# Patient Record
Sex: Female | Born: 1955 | Race: White | Hispanic: No | Smoking: Current every day smoker
Health system: Southern US, Community
[De-identification: ages and names within clinical notes are randomized; demographics above are authoritative.]

## PROBLEM LIST (undated history)

## (undated) DIAGNOSIS — I219 Acute myocardial infarction, unspecified: Secondary | ICD-10-CM

## (undated) DIAGNOSIS — E7800 Pure hypercholesterolemia, unspecified: Secondary | ICD-10-CM

## (undated) DIAGNOSIS — J439 Emphysema, unspecified: Secondary | ICD-10-CM

## (undated) DIAGNOSIS — Z720 Tobacco use: Secondary | ICD-10-CM

## (undated) DIAGNOSIS — I2510 Atherosclerotic heart disease of native coronary artery without angina pectoris: Secondary | ICD-10-CM

## (undated) DIAGNOSIS — F32A Depression, unspecified: Secondary | ICD-10-CM

## (undated) DIAGNOSIS — I5032 Chronic diastolic (congestive) heart failure: Secondary | ICD-10-CM

## (undated) DIAGNOSIS — Z1371 Encounter for nonprocreative screening for genetic disease carrier status: Secondary | ICD-10-CM

## (undated) DIAGNOSIS — M810 Age-related osteoporosis without current pathological fracture: Secondary | ICD-10-CM

## (undated) DIAGNOSIS — G2581 Restless legs syndrome: Secondary | ICD-10-CM

## (undated) DIAGNOSIS — Z9289 Personal history of other medical treatment: Secondary | ICD-10-CM

## (undated) DIAGNOSIS — F419 Anxiety disorder, unspecified: Secondary | ICD-10-CM

## (undated) DIAGNOSIS — M8580 Other specified disorders of bone density and structure, unspecified site: Secondary | ICD-10-CM

## (undated) DIAGNOSIS — G4730 Sleep apnea, unspecified: Secondary | ICD-10-CM

## (undated) DIAGNOSIS — E119 Type 2 diabetes mellitus without complications: Secondary | ICD-10-CM

## (undated) DIAGNOSIS — F329 Major depressive disorder, single episode, unspecified: Secondary | ICD-10-CM

## (undated) DIAGNOSIS — Z8041 Family history of malignant neoplasm of ovary: Secondary | ICD-10-CM

## (undated) DIAGNOSIS — I639 Cerebral infarction, unspecified: Secondary | ICD-10-CM

## (undated) DIAGNOSIS — I10 Essential (primary) hypertension: Secondary | ICD-10-CM

## (undated) DIAGNOSIS — T7840XA Allergy, unspecified, initial encounter: Secondary | ICD-10-CM

## (undated) DIAGNOSIS — R7302 Impaired glucose tolerance (oral): Secondary | ICD-10-CM

## (undated) DIAGNOSIS — J449 Chronic obstructive pulmonary disease, unspecified: Secondary | ICD-10-CM

## (undated) HISTORY — DX: Allergy, unspecified, initial encounter: T78.40XA

## (undated) HISTORY — DX: Depression, unspecified: F32.A

## (undated) HISTORY — DX: Anxiety disorder, unspecified: F41.9

## (undated) HISTORY — PX: ABDOMINAL HYSTERECTOMY: SHX81

## (undated) HISTORY — DX: Personal history of other medical treatment: Z92.89

## (undated) HISTORY — DX: Sleep apnea, unspecified: G47.30

## (undated) HISTORY — DX: Encounter for nonprocreative screening for genetic disease carrier status: Z13.71

## (undated) HISTORY — DX: Impaired glucose tolerance (oral): R73.02

## (undated) HISTORY — DX: Cerebral infarction, unspecified: I63.9

## (undated) HISTORY — DX: Major depressive disorder, single episode, unspecified: F32.9

## (undated) HISTORY — DX: Emphysema, unspecified: J43.9

## (undated) HISTORY — DX: Chronic diastolic (congestive) heart failure: I50.32

## (undated) HISTORY — DX: Other specified disorders of bone density and structure, unspecified site: M85.80

## (undated) HISTORY — DX: Family history of malignant neoplasm of ovary: Z80.41

## (undated) HISTORY — DX: Age-related osteoporosis without current pathological fracture: M81.0

## (undated) HISTORY — DX: Essential (primary) hypertension: I10

## (undated) HISTORY — DX: Type 2 diabetes mellitus without complications: E11.9

## (undated) HISTORY — DX: Chronic obstructive pulmonary disease, unspecified: J44.9

---

## 1998-02-12 HISTORY — PX: OTHER SURGICAL HISTORY: SHX169

## 1998-10-28 ENCOUNTER — Other Ambulatory Visit: Admission: RE | Admit: 1998-10-28 | Discharge: 1998-10-28 | Payer: Self-pay | Admitting: *Deleted

## 2000-02-25 ENCOUNTER — Other Ambulatory Visit: Admission: RE | Admit: 2000-02-25 | Discharge: 2000-02-25 | Payer: Self-pay | Admitting: *Deleted

## 2003-09-15 LAB — HM MAMMOGRAPHY

## 2004-06-14 ENCOUNTER — Encounter: Payer: Self-pay | Admitting: Family Medicine

## 2004-06-14 LAB — CONVERTED CEMR LAB

## 2004-07-18 ENCOUNTER — Ambulatory Visit: Payer: Self-pay | Admitting: Family Medicine

## 2004-08-15 ENCOUNTER — Ambulatory Visit: Payer: Self-pay | Admitting: Family Medicine

## 2004-08-20 ENCOUNTER — Ambulatory Visit: Payer: Self-pay | Admitting: Family Medicine

## 2004-10-01 ENCOUNTER — Ambulatory Visit: Payer: Self-pay | Admitting: Family Medicine

## 2004-10-24 ENCOUNTER — Ambulatory Visit: Payer: Self-pay | Admitting: Family Medicine

## 2005-03-02 ENCOUNTER — Ambulatory Visit: Payer: Self-pay | Admitting: Family Medicine

## 2005-05-04 ENCOUNTER — Ambulatory Visit: Payer: Self-pay | Admitting: Family Medicine

## 2005-06-19 ENCOUNTER — Ambulatory Visit: Payer: Self-pay | Admitting: Family Medicine

## 2005-06-23 ENCOUNTER — Ambulatory Visit: Payer: Self-pay | Admitting: Family Medicine

## 2005-10-27 LAB — HM DEXA SCAN

## 2005-11-30 ENCOUNTER — Ambulatory Visit: Payer: Self-pay | Admitting: Family Medicine

## 2005-12-31 ENCOUNTER — Ambulatory Visit: Payer: Self-pay | Admitting: Family Medicine

## 2006-02-11 ENCOUNTER — Ambulatory Visit: Payer: Self-pay | Admitting: Family Medicine

## 2006-06-11 ENCOUNTER — Ambulatory Visit: Payer: Self-pay | Admitting: Family Medicine

## 2006-10-15 HISTORY — PX: COLONOSCOPY: SHX174

## 2006-11-01 ENCOUNTER — Ambulatory Visit: Payer: Self-pay | Admitting: Unknown Physician Specialty

## 2006-11-01 LAB — HM COLONOSCOPY

## 2006-11-09 ENCOUNTER — Ambulatory Visit: Payer: Self-pay | Admitting: Family Medicine

## 2007-02-03 ENCOUNTER — Encounter: Payer: Self-pay | Admitting: Family Medicine

## 2007-02-07 DIAGNOSIS — I10 Essential (primary) hypertension: Secondary | ICD-10-CM | POA: Insufficient documentation

## 2007-02-07 DIAGNOSIS — M810 Age-related osteoporosis without current pathological fracture: Secondary | ICD-10-CM | POA: Insufficient documentation

## 2007-02-07 DIAGNOSIS — E7800 Pure hypercholesterolemia, unspecified: Secondary | ICD-10-CM | POA: Insufficient documentation

## 2007-02-07 DIAGNOSIS — E785 Hyperlipidemia, unspecified: Secondary | ICD-10-CM | POA: Insufficient documentation

## 2007-02-07 DIAGNOSIS — E1169 Type 2 diabetes mellitus with other specified complication: Secondary | ICD-10-CM | POA: Insufficient documentation

## 2007-02-07 DIAGNOSIS — F17200 Nicotine dependence, unspecified, uncomplicated: Secondary | ICD-10-CM | POA: Insufficient documentation

## 2007-02-16 ENCOUNTER — Ambulatory Visit: Payer: Self-pay | Admitting: Family Medicine

## 2007-03-11 ENCOUNTER — Ambulatory Visit: Payer: Self-pay | Admitting: Family Medicine

## 2007-03-11 DIAGNOSIS — G2581 Restless legs syndrome: Secondary | ICD-10-CM | POA: Insufficient documentation

## 2007-03-13 LAB — CONVERTED CEMR LAB
ALT: 17 units/L (ref 0–35)
AST: 16 units/L (ref 0–37)
Albumin: 3.8 g/dL (ref 3.5–5.2)
Alkaline Phosphatase: 113 units/L (ref 39–117)
BUN: 5 mg/dL — ABNORMAL LOW (ref 6–23)
Basophils Absolute: 0 10*3/uL (ref 0.0–0.1)
Basophils Relative: 0.4 % (ref 0.0–1.0)
Bilirubin, Direct: 0.1 mg/dL (ref 0.0–0.3)
CO2: 30 meq/L (ref 19–32)
Calcium: 9.3 mg/dL (ref 8.4–10.5)
Chloride: 111 meq/L (ref 96–112)
Cholesterol: 182 mg/dL (ref 0–200)
Creatinine, Ser: 0.7 mg/dL (ref 0.4–1.2)
Eosinophils Absolute: 0.3 10*3/uL (ref 0.0–0.6)
Eosinophils Relative: 4.7 % (ref 0.0–5.0)
Ferritin: 149.3 ng/mL (ref 10.0–291.0)
GFR calc Af Amer: 114 mL/min
GFR calc non Af Amer: 94 mL/min
Glucose, Bld: 107 mg/dL — ABNORMAL HIGH (ref 70–99)
HCT: 43.7 % (ref 36.0–46.0)
HDL: 30.3 mg/dL — ABNORMAL LOW (ref >39.0)
Hemoglobin: 15 g/dL (ref 12.0–15.0)
Iron: 94 ug/dL (ref 42–145)
LDL Cholesterol: 127 mg/dL — ABNORMAL HIGH (ref 0–99)
Lymphocytes Relative: 33.4 % (ref 12.0–46.0)
MCHC: 34.3 g/dL (ref 30.0–36.0)
MCV: 93 fL (ref 78.0–100.0)
Monocytes Absolute: 0.5 10*3/uL (ref 0.2–0.7)
Monocytes Relative: 8 % (ref 3.0–11.0)
Neutro Abs: 3.7 10*3/uL (ref 1.4–7.7)
Neutrophils Relative %: 53.5 % (ref 43.0–77.0)
Platelets: 261 10*3/uL (ref 150–400)
Potassium: 4.3 meq/L (ref 3.5–5.1)
RBC: 4.7 M/uL (ref 3.87–5.11)
RDW: 12.4 % (ref 11.5–14.6)
Sodium: 145 meq/L (ref 135–145)
TSH: 0.33 microintl units/mL — ABNORMAL LOW (ref 0.35–5.50)
Total Bilirubin: 0.8 mg/dL (ref 0.3–1.2)
Total CHOL/HDL Ratio: 6
Total Protein: 6.7 g/dL (ref 6.0–8.3)
Triglycerides: 123 mg/dL (ref 0–149)
VLDL: 25 mg/dL (ref 0–40)
Vitamin B-12: 189 pg/mL — ABNORMAL LOW (ref 211–911)
WBC: 6.8 10*3/uL (ref 4.5–10.5)

## 2007-05-19 ENCOUNTER — Telehealth: Payer: Self-pay | Admitting: Family Medicine

## 2007-05-24 ENCOUNTER — Ambulatory Visit: Payer: Self-pay | Admitting: Family Medicine

## 2007-07-05 ENCOUNTER — Telehealth: Payer: Self-pay | Admitting: Family Medicine

## 2007-07-05 ENCOUNTER — Ambulatory Visit: Payer: Self-pay | Admitting: Family Medicine

## 2007-07-28 ENCOUNTER — Ambulatory Visit: Payer: Self-pay | Admitting: Family Medicine

## 2007-07-28 DIAGNOSIS — F411 Generalized anxiety disorder: Secondary | ICD-10-CM | POA: Insufficient documentation

## 2007-07-28 DIAGNOSIS — F3289 Other specified depressive episodes: Secondary | ICD-10-CM | POA: Insufficient documentation

## 2007-07-28 DIAGNOSIS — M25569 Pain in unspecified knee: Secondary | ICD-10-CM | POA: Insufficient documentation

## 2007-07-28 DIAGNOSIS — F329 Major depressive disorder, single episode, unspecified: Secondary | ICD-10-CM | POA: Insufficient documentation

## 2007-08-17 ENCOUNTER — Encounter (INDEPENDENT_AMBULATORY_CARE_PROVIDER_SITE_OTHER): Payer: Self-pay | Admitting: *Deleted

## 2007-10-31 ENCOUNTER — Telehealth: Payer: Self-pay | Admitting: Family Medicine

## 2007-12-19 ENCOUNTER — Ambulatory Visit: Payer: Self-pay | Admitting: Family Medicine

## 2007-12-19 LAB — CONVERTED CEMR LAB
ALT: 17 units/L (ref 0–35)
AST: 17 units/L (ref 0–37)

## 2007-12-20 ENCOUNTER — Telehealth: Payer: Self-pay | Admitting: Family Medicine

## 2008-01-09 ENCOUNTER — Telehealth: Payer: Self-pay | Admitting: Family Medicine

## 2008-01-23 ENCOUNTER — Ambulatory Visit: Payer: Self-pay | Admitting: Family Medicine

## 2008-01-23 LAB — CONVERTED CEMR LAB
ALT: 17 units/L (ref 0–35)
AST: 18 units/L (ref 0–37)
Cholesterol: 195 mg/dL (ref 0–200)
HDL: 31 mg/dL — ABNORMAL LOW (ref >39.0)
LDL Cholesterol: 137 mg/dL — ABNORMAL HIGH (ref 0–99)
Total CHOL/HDL Ratio: 6.3
Triglycerides: 136 mg/dL (ref 0–149)
VLDL: 27 mg/dL (ref 0–40)

## 2008-02-08 ENCOUNTER — Encounter (INDEPENDENT_AMBULATORY_CARE_PROVIDER_SITE_OTHER): Payer: Self-pay | Admitting: *Deleted

## 2008-04-02 ENCOUNTER — Ambulatory Visit: Payer: Self-pay | Admitting: Family Medicine

## 2008-04-18 ENCOUNTER — Telehealth: Payer: Self-pay | Admitting: Family Medicine

## 2008-09-03 ENCOUNTER — Telehealth: Payer: Self-pay | Admitting: Family Medicine

## 2008-12-17 ENCOUNTER — Telehealth: Payer: Self-pay | Admitting: Family Medicine

## 2009-01-09 ENCOUNTER — Ambulatory Visit: Payer: Self-pay | Admitting: Family Medicine

## 2009-01-09 DIAGNOSIS — R5381 Other malaise: Secondary | ICD-10-CM | POA: Insufficient documentation

## 2009-01-09 DIAGNOSIS — R5383 Other fatigue: Secondary | ICD-10-CM | POA: Insufficient documentation

## 2009-01-09 DIAGNOSIS — IMO0002 Reserved for concepts with insufficient information to code with codable children: Secondary | ICD-10-CM | POA: Insufficient documentation

## 2009-08-14 ENCOUNTER — Telehealth: Payer: Self-pay | Admitting: Family Medicine

## 2009-08-14 ENCOUNTER — Ambulatory Visit: Payer: Self-pay | Admitting: Family Medicine

## 2009-10-07 ENCOUNTER — Telehealth: Payer: Self-pay | Admitting: Family Medicine

## 2009-11-18 ENCOUNTER — Telehealth: Payer: Self-pay | Admitting: Family Medicine

## 2010-04-17 ENCOUNTER — Encounter (INDEPENDENT_AMBULATORY_CARE_PROVIDER_SITE_OTHER): Payer: Self-pay | Admitting: *Deleted

## 2010-10-14 NOTE — Progress Notes (Signed)
Summary: Refill on Xanax  Phone Note Call from Patient Call back at [phone]   Caller: Patient Summary of Call: Pt has appt on 05.14.09. Wants to know if you will give her an refill on her generic Xanax. Initial call taken by: Mickle Asper,  January 09, 2008 2:06 PM  Follow-up for Phone Call        PRESCRIBTION CALLED INTO PHARMACYPATIENT NOTIFIED ABOUT MEDICATIONS Follow-up by: Providence Crosby,  January 09, 2008 5:17 PM      Prescriptions: ALPRAZOLAM 0.5 MG  TABS (ALPRAZOLAM) one tab by mouth HS 1/2 during day  #45 x 2   Entered and Authorized by:   Shaune Leeks MD   Signed by:   Providence Crosby on 01/09/2008   Method used:   Printed then faxed [facility] ...       [facility]  [address]  [id number]       [address]       [place], [place]  [address]       Ph: [phone] or [phone]       Fax: [phone]   RxID:   [id number]

## 2010-10-14 NOTE — Progress Notes (Signed)
Summary: asking about lisinopril  Phone Note Call from Patient Call back at Work Phone [phone] Call back at [phone] cell   Caller: Patient Call For: dr schaller Summary of Call: pt had been taking lisinopril but was taken off of it, she checked her BP last night and it was 166/88, which scared her, so she took a lisinopril. she is asking if she should go back on it, she has been off of it for about 2-3 weeks- she was taking 10 mg daily Initial call taken by: Lowella Petties,  April 18, 2008 1:00 PM  Follow-up for Phone Call        Yes restart. Please add back to med list. Follow-up by: Kerby Nora MD,  April 18, 2008 2:05 PM  Additional Follow-up for Phone Call Additional follow up Details #1::        Patient Advised.  Additional Follow-up by: Delilah Shan,  April 18, 2008 2:22 PM

## 2010-10-14 NOTE — Letter (Signed)
Summary: [place] No Show Letter  [place] [facility]  [address] [place], [place] [address]   Phone: [phone]  Fax: [phone]    02/08/2008 MRN: [id number]  [facility] [address] [place], [place]  [address]   Dear Ms. Torrisi,   Our records indicate that you missed your scheduled appointment with __DR. SCHALLER___________________ on __05/27/2009 AT 4 PM__________.  Please contact this office to reschedule your appointment as soon as possible.  It is important that you keep your scheduled appointments with your physician, so we can provide you the best care possible.  Please be advised that there may be a charge for "no show" appointments.    Sincerely,   [place] [facility]

## 2010-10-14 NOTE — Progress Notes (Signed)
Summary: alprazolam  Phone Note Refill Request Message from:  Patient on October 07, 2009 12:42 PM  Refills Requested: Medication #1:  ALPRAZOLAM 0.5 MG  TABS one tab by mouth HS 1/2 during day   Supply Requested: [facility] [place] [phone]   Please advise patient when this is called in [phone]   Method Requested: Telephone to Pharmacy Initial call taken by: Benny Lennert CMA Duncan Dull),  October 07, 2009 12:43 PM  Follow-up for Phone Call        Rx called to pharmacy Follow-up by: Benny Lennert CMA Duncan Dull),  October 07, 2009 2:57 PM    Prescriptions: ALPRAZOLAM 0.5 MG  TABS (ALPRAZOLAM) one tab by mouth HS 1/2 during day  #45 x 6   Entered and Authorized by:   Shaune Leeks MD   Signed by:   Shaune Leeks MD on 10/07/2009   Method used:   Telephoned to ...       [facility]  [place] (retail)       [address]       [place], [place]  [address]       Ph: [phone] or [phone]       Fax: [phone]   RxID:   [id number]

## 2010-10-14 NOTE — Assessment & Plan Note (Signed)
Summary: FOLLOW UP   Vital Signs:  Patient Profile:   55 Years Old Female Weight:      188 pounds Temp:     98.3 degrees F oral Pulse rate:   72 / minute Pulse rhythm:   regular BP sitting:   110 / 80  (left arm) Cuff size:   regular  Vitals Entered By: Providence Crosby (July 28, 2007 3:51 PM)                 Chief Complaint:  2 week f/u.  History of Present Illness: Here for f/u.  Depression significantly better, anxiety improved and has been taking a half of Xanax during the day which has really helped. Has had some knee pain bilat, L>R. indistinct in the area of the kneecap but not typically up or down steps.  Current Allergies: No known allergies       Physical Exam  General:     Well-developed,well-nourished,in no acute distress; alert,appropriate and cooperative throughout examination Head:     Normocephalic and atraumatic without obvious abnormalities. No apparent alopecia or balding. Eyes:     Conjunctiva clear bilaterally.  Ears:     External ear exam shows no significant lesions or deformities.  Otoscopic examination reveals clear canals, tympanic membranes are intact bilaterally without bulging, retraction, inflammation or discharge. Hearing is grossly normal bilaterally. Nose:     External nasal examination shows no deformity or inflammation. Nasal mucosa are pink and moist without lesions or exudates. Mouth:     Oral mucosa and oropharynx without lesions or exudates.  Teeth in good repair. Neck:     No deformities, masses, or tenderness noted. Chest Wall:     No deformities, masses, or tenderness noted. Lungs:     Normal respiratory effort, chest expands symmetrically. Lungs are clear to auscultation, no crackles or wheezes. Heart:     Normal rate and regular rhythm. S1 and S2 normal without gallop, murmur, click, rub or other extra sounds. Abdomen:     Bowel sounds positive,abdomen soft and non-tender without masses, organomegaly or hernias  noted. Extremities:     Knee exam jopint line and provacative maneuvers basically nml.    Impression & Recommendations:  Problem # 1:  KNEE PAIN, BILATERAL (ICD-719.46) Assessment: New  Orders: Orthopedic Referral (Ortho) Discussed strengthening exercises, use of ice or heat, and medications.   Problem # 2:  RESTLESS LEG SYNDROME (ICD-333.94) Assessment: Improved Cont Lyrica, which has helped significantly.  Problem # 3:  DEPRESSION (ICD-311) Assessment: Improved  Her updated medication list for this problem includes:    Zoloft 100 Mg Tabs (Sertraline hcl) ..... One tab    Alprazolam 0.5 Mg Tabs (Alprazolam) ..... One tab by mouth hs 1/2 during day   Problem # 4:  ANXIETY DISORDER (ICD-300.00) Assessment: Improved Change xanax to 11/2 qd Her updated medication list for this problem includes:    Zoloft 100 Mg Tabs (Sertraline hcl) ..... One tab    Alprazolam 0.5 Mg Tabs (Alprazolam) ..... One tab by mouth hs 1/2 during day Discussed medication use and relaxation techniques.   Complete Medication List: 1)  Lipitor 40 Mg Tabs (Atorvastatin calcium) .... Take one by mouth daily 2)  Requip 1 Mg Tabs (Ropinirole hydrochloride) .... Take one by mouth qhs 3)  Lisinopril 10 Mg Tabs (Lisinopril) .... Take one by mouth daily 4)  Actonel 35 Mg Tabs (Risedronate sodium) .... Take one by mouth daily 5)  Tylenol Pm Extra Strength 500-25 Mg Tabs (Diphenhydramine-apap (sleep)) .Marland KitchenMarland KitchenMarland Kitchen  Take by mouth as directed prn 6)  Zoloft 100 Mg Tabs (Sertraline hcl) .... One tab 7)  Alprazolam 0.5 Mg Tabs (Alprazolam) .... One tab by mouth hs 1/2 during day   Patient Instructions: 1)  Refer to Ortho for knee pain 2)  RTC as directed, cancel appt next month.    Prescriptions: ALPRAZOLAM 0.5 MG  TABS (ALPRAZOLAM) one tab by mouth HS 1/2 during day  #45 x 2   Entered and Authorized by:   Shaune Leeks MD   Signed by:   Shaune Leeks MD on 07/28/2007   Method used:   Print then Give  to Patient   RxID:   [id number] ACTONEL 35 MG TABS (RISEDRONATE SODIUM) take one by mouth daily  #30 x 12   Entered and Authorized by:   Shaune Leeks MD   Signed by:   Shaune Leeks MD on 07/28/2007   Method used:   Print then Give to Patient   RxID:   [id number]  ]

## 2010-10-14 NOTE — Assessment & Plan Note (Signed)
Summary: FOLLOW UP / LFW   Vital Signs:  Patient Profile:   55 Years Old Female Weight:      190 pounds Temp:     98.1 degrees F oral Pulse rate:   80 / minute Pulse rhythm:   regular BP sitting:   130 / 70  (left arm) Cuff size:   regular  Vitals Entered By: Providence Crosby (April 02, 2008 4:02 PM)                 Chief Complaint:  followup visit.  History of Present Illness: Here for followup of various things. Started on Simvastatin 80mg  in place of Lipitor 40 several months ago due to cost and failed to show up fior followup. Her labs show Simvastatin not to help as much as Lipitor but probably acceptable. She is off all meds for about two months as she just didn't have them refilled. I told her just stopping Zoloft and poss Lyrica (which at this point she said she did not stop the Lyrica because it helped so much to sleep) and maybe Xanax if she takes it regularly will make her feel really bad and she said that explains a lot of things. She is currently off Lisinopril as well with normal BP at this point. She is also off her Actonel. She also complained of weight gain altho she admits she comes home from work and wants to do nothing and often doesn't. She needs a motivating exercise program with which she can get involved.    Prior Medications Reviewed Using: Patient Recall  Current Allergies: No known allergies       Physical Exam  General:     Well-developed,well-nourished,in no acute distress; alert,appropriate and cooperative throughout examination Head:     Normocephalic and atraumatic without obvious abnormalities. No apparent alopecia or balding. Eyes:     Conjunctiva clear bilaterally.  Ears:     External ear exam shows no significant lesions or deformities.  Otoscopic examination reveals clear canals, tympanic membranes are intact bilaterally without bulging, retraction, inflammation or discharge. Hearing is grossly normal bilaterally. Nose:     External  nasal examination shows no deformity or inflammation. Nasal mucosa are pink and moist without lesions or exudates. Mouth:     Oral mucosa and oropharynx without lesions or exudates.  Teeth in good repair. Neck:     No deformities, masses, or tenderness noted. Chest Wall:     No deformities, masses, or tenderness noted. Lungs:     Normal respiratory effort, chest expands symmetrically. Lungs are clear to auscultation, no crackles or wheezes. Heart:     Normal rate and regular rhythm. S1 and S2 normal without gallop, murmur, click, rub or other extra sounds. Psych:     Reasonable affect, good eye contact and good attitude.    Impression & Recommendations:  Problem # 1:  ANXIETY DISORDER (ICD-300.00) Assessment: Unchanged Meds prescribed today, will try  Effexor to preclude overt weight loss. Xanax requested by pt. Her updated medication list for this problem includes:    Effexor 37.5 Mg Tabs (Venlafaxine hcl) ..... One tab by mouth twice a day    Alprazolam 0.5 Mg Tabs (Alprazolam) ..... One tab by mouth hs 1/2 during day   Problem # 2:  RESTLESS LEG SYNDROME (ICD-333.94) Assessment: Improved Requip helps a lot.  Problem # 3:  Hx of HYPERCHOLESTEROLEMIA (ICD-272.0) Assessment: Unchanged Adequate or close thereto on Simvastatin 80. Not as good as Lipitor 40. Her updated medication list for  this problem includes:    Simvastatin 80 Mg Tabs (Simvastatin) ..... One tab by mouth hs.  Labs Reviewed: Chol: 195 (01/23/2008)   HDL: 31.0 (01/23/2008)   LDL: 137 (01/23/2008)   TG: 136 (01/23/2008) LDL 127 on Lipitor 40mg . SGOT: 18 (01/23/2008)   SGPT: 17 (01/23/2008)   Problem # 4:  HYPERTENSION (ICD-401.9) Assessment: Improved Doesn't appear to need Lisinopril so will stop. Her updated medication list for this problem includes:    Lisinopril 10 Mg Tabs (Lisinopril) .Marland Kitchen... Take one by mouth daily  BP today: 130/70  BP 130/70 by me. Prior BP: 110/80 (07/28/2007)  Labs Reviewed:  Creat: 0.7 (03/11/2007) Chol: 195 (01/23/2008)   HDL: 31.0 (01/23/2008)   LDL: 137 (01/23/2008)   TG: 136 (01/23/2008)   Complete Medication List: 1)  Requip 1 Mg Tabs (Ropinirole hydrochloride) .... Take one by mouth qhs 2)  Lisinopril 10 Mg Tabs (Lisinopril) .... Take one by mouth daily 3)  Actonel 35 Mg Tabs (Risedronate sodium) .... Take one by mouth daily 4)  Tylenol Pm Extra Strength 500-25 Mg Tabs (Diphenhydramine-apap (sleep)) .... Take by mouth as directed prn 5)  Effexor 37.5 Mg Tabs (Venlafaxine hcl) .... One tab by mouth twice a day 6)  Alprazolam 0.5 Mg Tabs (Alprazolam) .... One tab by mouth hs 1/2 during day 7)  Simvastatin 80 Mg Tabs (Simvastatin) .... One tab by mouth hs.   Patient Instructions: 1)  RTC 2 mos.   Prescriptions: ALPRAZOLAM 0.5 MG  TABS (ALPRAZOLAM) one tab by mouth HS 1/2 during day  #45 x 12   Entered and Authorized by:   Shaune Leeks MD   Signed by:   Shaune Leeks MD on 04/02/2008   Method used:   Print then Give to Patient   RxID:   [id number] EFFEXOR 37.5 MG  TABS (VENLAFAXINE HCL) one tab by mouth twice a day  #60 x 12   Entered and Authorized by:   Shaune Leeks MD   Signed by:   Shaune Leeks MD on 04/02/2008   Method used:   Electronically [facility] to ...       [facility]  [address] [phone]*       [address]       [place], [place]  [address]       Ph: [phone]       Fax: [phone]   RxID:   [id number]  ]

## 2010-10-14 NOTE — Progress Notes (Signed)
Summary: ?lab abbt? rx?  Phone Note Call from Patient Call back at [phone]   Caller: Patient Call For: [name] Summary of Call: pthad labs months ago, you mentioned on lab results you wanted to see her or have her have more labs befoer december. which was it? pt also wants to know if you would prescribe xanax. Initial call taken by: Liane Comber,  May 19, 2007 12:03 PM  Follow-up for Phone Call        My lab note says to recheck thyroid in 3 mos., Dec would be fine. Would also check B12 lvl again as that was low. I would not prescribe Xanax without seeing pt and knowing what is going on...see me if feels she needs medication. Follow-up by: Shaune Leeks MD,  May 19, 2007 1:26 PM  Additional Follow-up for Phone Call Additional follow up Details #1::         pc to pt. appt made for 05/24/07 @ 3:45 pm. Additional Follow-up by: Cooper Render,  May 19, 2007 4:29 PM

## 2010-10-14 NOTE — Letter (Signed)
Summary: Nadara Eaton letter  [place] [facility]  [address] [place], [place] [address]   Phone: [phone]  Fax: [phone]       04/17/2010 MRN: [id number]  [facility] [address] [place], [place]  [address]  Dear Ms. [facility] [place] and [place] announce the retirement of Arta Silence, M.D., from full-time practice at the [facility] office effective March 13, 2010 and his plans of returning part-time.  It is important to Dr. Hetty Ely and to our practice that you understand that [facility] has seven physicians in our office for your health care needs.  We will continue to offer the same exceptional care that you have today.    Dr. Hetty Ely has spoken to many of you about his plans for retirement and returning part-time in the fall.   We will continue to work with you through the transition to schedule appointments for you in the office and meet the high standards that [facility] is committed to.   Again, it is with great pleasure that we share the news that Dr. Hetty Ely will return to [facility] in October of 2011 with a reduced schedule.    If you have any questions, or would like to request an appointment with one of our physicians, please call us at [phone] and press the option for Scheduling an appointment.  We take pleasure in providing you with excellent patient care and look forward to seeing you at your next office visit.  Our [facility] Physicians are:  Tillman Abide, M.D. Laurita Quint, M.D. Roxy Manns, M.D. Kerby Nora, M.D. Hannah Beat, M.D. Ruthe Mannan, M.D. We proudly welcomed Raechel Ache, M.D. and Eustaquio Boyden, M.D. to the practice in July/August 2011.  Sincerely,  [place] [facility]

## 2010-10-14 NOTE — Progress Notes (Signed)
Summary: xanax  Phone Note Outgoing Call   Call placed by: Providence Crosby,  July 05, 2007 9:26 AM Call placed to: [phone] Summary of Call: rx for xanax 0.5 ml called into  pharmacy  #30 with no refills Initial call taken by: Providence Crosby,  July 05, 2007 9:27 AM

## 2010-10-14 NOTE — Progress Notes (Signed)
Summary: needs new script for requip  Phone Note Refill Request Call back at Home Phone [phone] Message from:  Patient  Refills Requested: Medication #1:  REQUIP 1 MG TABS take one by mouth at bedtime Pt has been taking 2 requip when she needs to and now needs an early refill. Insurance will not pay unless script is changed to read take one to two as needed.   [facility]  Initial call taken by: Lowella Petties CMA,  November 18, 2009 9:54 AM    New/Updated Medications: REQUIP 1 MG TABS (ROPINIROLE HYDROCHLORIDE) take one to two  by mouth at bedtime as needed Prescriptions: REQUIP 1 MG TABS (ROPINIROLE HYDROCHLORIDE) take one to two  by mouth at bedtime as needed  #60 x 12   Entered and Authorized by:   Shaune Leeks MD   Signed by:   Lowella Petties CMA on 11/18/2009   Method used:   Electronically to        [facility]  [place] (retail)       [address]       [place], [place]  [address]       Ph: [phone] or [phone]       Fax: [phone]   RxID:   [id number]

## 2010-10-14 NOTE — Assessment & Plan Note (Signed)
Summary: 6 WEEKS FOLLOW UP/[facility]   Vital Signs:  Patient Profile:   55 Years Old Female Weight:      188 pounds Temp:     98.1 degrees F oral Pulse rate:   80 / minute Pulse rhythm:   regular BP sitting:   140 / 90  (left arm) Cuff size:   regular  Vitals Entered By: Providence Crosby (July 05, 2007 8:26 AM)                 Chief Complaint:  6 week f/u.  History of Present Illness: Having some nausea with increased dose of Zoloft and having difficulty sleeping...has used Tyl PM for a long time. Also has taken TV out of bedroom and other sleep hygiene maneuvers..  Has tried Xanax from her brother before which has helped. Also has what sounds like neck tightness upon awakening in the morning.  Current Allergies: No known allergies       Physical Exam  General:     Well-developed,well-nourished,in no acute distress; alert,appropriate and cooperative throughout examination Head:     Normocephalic and atraumatic without obvious abnormalities. No apparent alopecia or balding. Eyes:     Conjunctiva clear bilaterally.  Ears:     External ear exam shows no significant lesions or deformities.  Otoscopic examination reveals clear canals, tympanic membranes are intact bilaterally without bulging, retraction, inflammation or discharge. Hearing is grossly normal bilaterally. Nose:     External nasal examination shows no deformity or inflammation. Nasal mucosa are pink and moist without lesions or exudates. Mouth:     Oral mucosa and oropharynx without lesions or exudates.  Teeth in good repair. Neck:     No deformities, masses, or tenderness noted. Chest Wall:     No deformities, masses, or tenderness noted. Lungs:     Normal respiratory effort, chest expands symmetrically. Lungs are clear to auscultation, no crackles or wheezes. Heart:     Normal rate and regular rhythm. S1 and S2 normal without gallop, murmur, click, rub or other extra sounds.    Impression &  Recommendations:  Problem # 1:  RESTLESS LEG SYNDROME (ICD-333.94) Cont Requip which has helped. Script sent to pharmacy.  Problem # 2:  DEPRESSION (ICD-311) Try taking Zoloft 1/2 tab two times a day and add Xanax at night. Her updated medication list for this problem includes:    Zoloft 100 Mg Tabs (Sertraline hcl) ..... One tab    Alprazolam 0.5 Mg Tabs (Alprazolam) ..... One tab by mouth hs Script called to Pharmacy.  Her updated medication list for this problem includes:    Zoloft 100 Mg Tabs (Sertraline hcl) ..... One tab    Alprazolam 0.5 Mg Tabs (Alprazolam) ..... One tab by mouth hs   Problem # 3:  HYPERTENSION (ICD-401.9) Assessment: Deteriorated Will check next time. Her updated medication list for this problem includes:    Lisinopril 10 Mg Tabs (Lisinopril) .Marland Kitchen... Take one by mouth daily  BP today: 140/90 Prior BP: 120/80 (05/24/2007)  Labs Reviewed: Creat: 0.7 (03/11/2007) Chol: 182 (03/11/2007)   HDL: 30.3 (03/11/2007)   LDL: 127 (03/11/2007)   TG: 123 (03/11/2007)   Complete Medication List: 1)  Lipitor 40 Mg Tabs (Atorvastatin calcium) .... Take one by mouth daily 2)  Requip 1 Mg Tabs (Ropinirole hydrochloride) .... Take one by mouth qhs 3)  Lisinopril 10 Mg Tabs (Lisinopril) .... Take one by mouth daily 4)  Actonel 35 Mg Tabs (Risedronate sodium) .... Take one by mouth daily 5)  Tylenol Pm Extra Strength 500-25 Mg Tabs (Diphenhydramine-apap (sleep)) .... Take by mouth as directed prn 6)  Zoloft 100 Mg Tabs (Sertraline hcl) .... One tab 7)  Alprazolam 0.5 Mg Tabs (Alprazolam) .... One tab by mouth hs   Patient Instructions: 1)  RTC 2 weeks.    Prescriptions: REQUIP 1 MG TABS (ROPINIROLE HYDROCHLORIDE) take one by mouth qhs  #30 x 12   Entered and Authorized by:   Shaune Leeks MD   Signed by:   Shaune Leeks MD on 07/05/2007   Method used:   Electronically sent to ...       [facility]  [address]  [id number]       [address]        [place], [place]  [address]       Ph: [phone] or [phone]       Fax: [phone]   RxID:   [id number] ALPRAZOLAM 0.5 MG  TABS (ALPRAZOLAM) one tab by mouth HS  #30 x 0   Entered and Authorized by:   Shaune Leeks MD   Signed by:   Shaune Leeks MD on 07/05/2007   Method used:   Electronically sent to ...       [facility]  [address]  [id number]       [address]       [place], [place]  [address]       Ph: [phone] or [phone]       Fax: [phone]   RxID:   [id number]  ]

## 2010-10-14 NOTE — Assessment & Plan Note (Signed)
Summary: F/U REFILL MEDICATIONS/CLE   Vital Signs:  Patient profile:   55 year old female Height:      66 inches Weight:      189 pounds BMI:     30.62 Temp:     98.3 degrees F oral Pulse rate:   80 / minute Pulse rhythm:   regular BP sitting:   130 / 70  (left arm) Cuff size:   regular  Vitals Entered By: Providence Crosby (January 09, 2009 3:47 PM) CC: REFILL MEDICATIONS// SEVERE FATIQUE AND HIPS HURT   History of Present Illness: Pt is having a lot more pain in the legs. She has hips and mild back pain.  She is tired all the time. Her sleep is difficult...wakes up a lot, can't get comfortable. Legs ache from the knees and works itself up. Sitting for any length of time causes the legs to be a problem. She gets worse the longer she sits. She has never had overt back pain but has for some time had bilat leg pain and jumpiness.   Problems Prior to Update: 1)  Fatigue  (ICD-780.79) 2)  Back Pain, Lumbar, With Radiculopathy  (ICD-724.4) 3)  Anxiety Disorder  (ICD-300.00) 4)  Knee Pain, Bilateral  (ICD-719.46) 5)  Restless Leg Syndrome  (ICD-333.94) 6)  Hx of Tobacco Abuse  (ICD-305.1) 7)  Hx of Hypercholesterolemia  (ICD-272.0) 8)  Osteoporosis  (ICD-733.00) 9)  Hypertension  (ICD-401.9) 10)  Depression, Chronic  (ICD-311)  Medications Prior to Update: 1)  Requip 1 Mg Tabs (Ropinirole Hydrochloride) .... Take One By Mouth Qhs 2)  Lisinopril 10 Mg Tabs (Lisinopril) .... Take One By Mouth Daily 3)  Actonel 35 Mg Tabs (Risedronate Sodium) .... Take One By Mouth Daily 4)  Tylenol Pm Extra Strength 500-25 Mg Tabs (Diphenhydramine-Apap (Sleep)) .... Take By Mouth As Directed Prn 5)  Effexor 37.5 Mg  Tabs (Venlafaxine Hcl) .... One Tab By Mouth Twice A Day 6)  Alprazolam 0.5 Mg  Tabs (Alprazolam) .... One Tab By Mouth Hs 1/2 During Day 7)  Simvastatin 80 Mg  Tabs (Simvastatin) .... One Tab By Mouth Hs.  Allergies (verified): No Known Drug Allergies  Physical Exam  General:   Well-developed,well-nourished,in no acute distress; alert,appropriate and cooperative throughout examination Head:  Normocephalic and atraumatic without obvious abnormalities. No apparent alopecia or balding. Eyes:  Conjunctiva clear bilaterally.  Ears:  External ear exam shows no significant lesions or deformities.  Otoscopic examination reveals clear canals, tympanic membranes are intact bilaterally without bulging, retraction, inflammation or discharge. Hearing is grossly normal bilaterally. Nose:  External nasal examination shows no deformity or inflammation. Nasal mucosa are pink and moist without lesions or exudates. Mouth:  Oral mucosa and oropharynx without lesions or exudates.  Teeth in good repair. Neck:  No deformities, masses, or tenderness noted. Chest Wall:  No deformities, masses, or tenderness noted. Lungs:  Normal respiratory effort, chest expands symmetrically. Lungs are clear to auscultation, no crackles or wheezes. Heart:  Normal rate and regular rhythm. S1 and S2 normal without gallop, murmur, click, rub or other extra sounds. Msk:  No deformity or scoliosis noted of thoracic or lumbar spine.  Siiting but fidgiting a lot when seen.   Impression & Recommendations:  Problem # 1:  BACK PAIN, LUMBAR, WITH RADICULOPATHY (ICD-724.4) Assessment Deteriorated Will refer to Ortho for assessment of poss radiculopathy from her lower back due to history.  Pt has osteoporosis, poss of compr fx , etc in the differential.  Orders: Orthopedic Referral (Ortho)  Problem # 2:  FATIGUE (ICD-780.79) Assessment: New Feel her fatigue is from a constellation of things, leg pain, restless legs, anxiety, depression....the latter two getting worse with sleep deprivation. If leg pain/radiculopathy can be improved, her sleep will certainly improve which may improve other things.  Complete Medication List: 1)  Requip 1 Mg Tabs (Ropinirole hydrochloride) .... Take one by mouth qhs 2)  Lisinopril 10  Mg Tabs (Lisinopril) .... Take one by mouth daily 3)  Actonel 35 Mg Tabs (Risedronate sodium) .... Take one by mouth daily 4)  Tylenol Pm Extra Strength 500-25 Mg Tabs (Diphenhydramine-apap (sleep)) .... Take by mouth as directed prn 5)  Effexor 37.5 Mg Tabs (Venlafaxine hcl) .... One tab by mouth twice a day 6)  Alprazolam 0.5 Mg Tabs (Alprazolam) .... One tab by mouth hs 1/2 during day 7)  Simvastatin 80 Mg Tabs (Simvastatin) .... One tab by mouth hs.  Patient Instructions: 1)  Refer to Ortho. 2)  Medications refilled. Prescriptions: LISINOPRIL 10 MG TABS (LISINOPRIL) take one by mouth daily  #30 Tablet x 11   Entered and Authorized by:   Shaune Leeks MD   Signed by:   Shaune Leeks MD on 01/09/2009   Method used:   Print then Give to Patient   RxID:   [id number] EFFEXOR 37.5 MG  TABS (VENLAFAXINE HCL) one tab by mouth twice a day  #60 x 12   Entered and Authorized by:   Shaune Leeks MD   Signed by:   Shaune Leeks MD on 01/09/2009   Method used:   Print then Give to Patient   RxID:   [id number] SIMVASTATIN 80 MG  TABS (SIMVASTATIN) one tab by mouth hs.  #30 x 12   Entered and Authorized by:   Shaune Leeks MD   Signed by:   Shaune Leeks MD on 01/09/2009   Method used:   Print then Give to Patient   RxID:   [id number] ALPRAZOLAM 0.5 MG  TABS (ALPRAZOLAM) one tab by mouth HS 1/2 during day  #45 x 6   Entered and Authorized by:   Shaune Leeks MD   Signed by:   Shaune Leeks MD on 01/09/2009   Method used:   Print then Give to Patient   RxID:   [id number] REQUIP 1 MG TABS (ROPINIROLE HYDROCHLORIDE) take one by mouth qhs  #30 Tablet x 12   Entered and Authorized by:   Shaune Leeks MD   Signed by:   Shaune Leeks MD on 01/09/2009   Method used:   Print then Give to Patient   RxID:   [id number]

## 2010-10-14 NOTE — Assessment & Plan Note (Signed)
Summary: sgot//sgpt// 272.0/whc      Current Allergies: No known allergies         Complete Medication List: 1)  Requip 1 Mg Tabs (Ropinirole hydrochloride) .... Take one by mouth qhs 2)  Lisinopril 10 Mg Tabs (Lisinopril) .... Take one by mouth daily 3)  Actonel 35 Mg Tabs (Risedronate sodium) .... Take one by mouth daily 4)  Tylenol Pm Extra Strength 500-25 Mg Tabs (Diphenhydramine-apap (sleep)) .... Take by mouth as directed prn 5)  Zoloft 100 Mg Tabs (Sertraline hcl) .... One tab 6)  Alprazolam 0.5 Mg Tabs (Alprazolam) .... One tab by mouth hs 1/2 during day 7)  Simvastatin 80 Mg Tabs (Simvastatin) .... One tab by mouth hs.     ]

## 2010-10-14 NOTE — Progress Notes (Signed)
Summary: ? Lipitor  Phone Note Call from Patient Call back at [phone]   Caller: Patient Call For: Dr. Hetty Ely Summary of Call: Pt is on Lipitor 80 mg and take 1/2 daily.  This medication has gotten so expensive and wants to know if this can be changed to a generic and go back down to the 40 mg? Initial call taken by: Sydell Axon,  October 31, 2007 1:51 PM  Follow-up for Phone Call        If goes to generic, will have to go back to 80 mg. Try Simvastatin 80 mg (sent electronically) and get SGOT, SGPT 6 weeks, SGOT,SGPT, Chol profile 3 mos, see mee 2 days later. Follow-up by: Shaune Leeks MD,  October 31, 2007 2:04 PM  Additional Follow-up for Phone Call Additional follow up Details #1::        patient notified and appointments made Additional Follow-up by: Providence Crosby,  October 31, 2007 3:56 PM    New/Updated Medications: SIMVASTATIN 80 MG  TABS (SIMVASTATIN) one tab by mouth hs.   Prescriptions: SIMVASTATIN 80 MG  TABS (SIMVASTATIN) one tab by mouth hs.  #30 x 12   Entered and Authorized by:   Shaune Leeks MD   Signed by:   Shaune Leeks MD on 10/31/2007   Method used:   Electron[facility] to ...       [facility]  [address]  [id number]       [address]       [place], [place]  [address]       Ph: [phone] or [phone]       Fax: [phone]   RxID:   [id number]

## 2010-10-14 NOTE — Progress Notes (Signed)
Summary: REFILL XANAX  Phone Note Refill Request Message from:  Fax from Pharmacy on December 17, 2008 10:07 AM  Refills Requested: Medication #1:  ALPRAZOLAM 0.5 MG  TABS one tab by mouth HS 1/2 during day   [phone]  Initial call taken by: Providence Crosby,  December 17, 2008 10:08 AM  Follow-up for Phone Call        called to pharmacy line  Follow-up by: Providence Crosby,  December 17, 2008 12:15 PM      Prescriptions: ALPRAZOLAM 0.5 MG  TABS (ALPRAZOLAM) one tab by mouth HS 1/2 during day  #45 x 6   Entered and Authorized by:   Shaune Leeks MD   Signed by:   Shaune Leeks MD on 12/17/2008   Method used:   Telephoned to ...       [facility]  [address] (retail)       [address]       [place], [place]  [address]       Ph: [phone] or [phone]       Fax: [phone]   RxID:   [id number]

## 2010-10-14 NOTE — Letter (Signed)
Summary: [place] No Show Letter  [place] at [facility]  [address] [place], [place] [address]   Phone: [phone]  Fax: [phone]    08/17/2007   Dear Ms. Dicola,   Our records indicate that you missed your scheduled appointment with __DR.SCHALLER___________________ on _12/11/2006 AT 8:15AM___________.  Please contact this office to reschedule your appointment as soon as possible.  It is important that you keep your scheduled appointments with your physician, so we can provide you the best care possible.  Please be advised that there may be a charge for "no show" appointments.    Sincerely,    at [facility]

## 2010-10-14 NOTE — Progress Notes (Signed)
Summary: refill xanax  Phone Note Refill Request Message from:  Fax from Pharmacy on September 03, 2008 2:14 PM  Refills Requested: Medication #1:  ALPRAZOLAM 0.5 MG  TABS one tab by mouth HS 1/2 during day   [phone]  Initial call taken by: Providence Crosby,  September 03, 2008 2:15 PM  Follow-up for Phone Call        Rx called to pharmacy// [facility] PHARM[facility] LINE Follow-up by: Providence Crosby,  September 03, 2008 4:04 PM      Prescriptions: ALPRAZOLAM 0.5 MG  TABS (ALPRAZOLAM) one tab by mouth HS 1/2 during day  #45 x 6   Entered and Authorized by:   Shaune Leeks MD   Signed by:   Shaune Leeks MD on 09/03/2008   Method used:   Telephoned to ...       [facility]  [address] (retail)       [address]       [place], [place]  [address]       Ph: [phone] or [phone]       Fax: [phone]   RxID:   [id number]  Shaune Leeks MD  September 03, 2008 4:00 PM

## 2010-10-14 NOTE — Assessment & Plan Note (Signed)
Summary: discuss need for med/kmo   Vital Signs:  Patient Profile:   55 Years Old Female Weight:      187 pounds Temp:     98.7 degrees F oral Pulse rate:   88 / minute Pulse rhythm:   regular BP sitting:   120 / 80  (left arm) Cuff size:   regular  Vitals Entered ByProvidence Crosby (May 24, 2007 3:54 PM)                 Chief Complaint:  DISCUSS MEDICATION.  History of Present Illness: Tired of feeling the way she does...doesn't feel like can feel good because something else will happen. Has feeling of dread...has seen psychiatrist in past and was told she is a passive depressant...functions ok to work, but just doesn't feel happy. bROTHER TAKES xANAX AND SHE THINKS FROM WHAT HE SAYS THAT IT MIGHT HELP HER...ha gave her one to halve to try.  Current Allergies: No known allergies       Physical Exam  General:     Well-developed,well-nourished,in no acute distress; alert,appropriate and cooperative throughout examination Head:     Normocephalic and atraumatic without obvious abnormalities. No apparent alopecia or balding. Eyes:     Conjunctiva clear bilaterally.  Ears:     External ear exam shows no significant lesions or deformities.  Otoscopic examination reveals clear canals, tympanic membranes are intact bilaterally without bulging, retraction, inflammation or discharge. Hearing is grossly normal bilaterally. Nose:     External nasal examination shows no deformity or inflammation. Nasal mucosa are pink and moist without lesions or exudates. Mouth:     Oral mucosa and oropharynx without lesions or exudates.  Teeth in good repair. Neck:     No deformities, masses, or tenderness noted. Chest Wall:     No deformities, masses, or tenderness noted. Lungs:     Normal respiratory effort, chest expands symmetrically. Lungs are clear to auscultation, no crackles or wheezes. Heart:     Normal rate and regular rhythm. S1 and S2 normal without gallop, murmur, click,  rub or other extra sounds. Psych:     Cognition and judgment appear intact. Alert and cooperative with normal attention span and concentration. No apparent delusions, illusions, hallucinations Good affect in that she can laugh in the face of generally flat affect.    Impression & Recommendations:  Problem # 1:  DEPRESSION (ICD-311) Assessment: Unchanged Increase Zoloft to 100mg . Exercise and be active, talk about feelings with people. The following medications were removed from the medication list:    Paxil 20 Mg Tabs (Paroxetine hcl) .Marland Kitchen... Take 1 tablet by mouth once a day    Amitriptyline Hcl 25 Mg Tabs (Amitriptyline hcl) .Marland Kitchen... Take 1 tablet by mouth at bedtime    Hydroxyzine Hcl 25 Mg Tabs (Hydroxyzine hcl) ..... One tab by mouth at night as needed  Her updated medication list for this problem includes:    Zoloft 100 Mg Tabs (Sertraline hcl) ..... One tab   Problem # 2:  HYPERTENSION (ICD-401.9) Assessment: Unchanged  The following medications were removed from the medication list:    Hydrochlorothiazide 12.5 Mg Tabs (Hydrochlorothiazide) .Marland Kitchen... Take one by mouth daily    Lisinopril 5 Mg Tabs (Lisinopril) .Marland Kitchen... Take 1 tablet by mouth once a day  Her updated medication list for this problem includes:    Lisinopril 10 Mg Tabs (Lisinopril) .Marland Kitchen... Take one by mouth daily  BP today: 120/80 Prior BP: 120/70 (02/16/2007)  Labs Reviewed: Creat: 0.7 (03/11/2007)  Chol: 182 (03/11/2007)   HDL: 30.3 (03/11/2007)   LDL: 127 (03/11/2007)   TG: 123 (03/11/2007)   Complete Medication List: 1)  Lipitor 40 Mg Tabs (Atorvastatin calcium) .... Take one by mouth daily 2)  Requip 1 Mg Tabs (Ropinirole hydrochloride) .... Take one by mouth qhs 3)  Lisinopril 10 Mg Tabs (Lisinopril) .... Take one by mouth daily 4)  Actonel 35 Mg Tabs (Risedronate sodium) .... Take one by mouth daily 5)  Tylenol Pm Extra Strength 500-25 Mg Tabs (Diphenhydramine-apap (sleep)) .... Take by mouth as directed prn 6)   Zoloft 100 Mg Tabs (Sertraline hcl) .... One tab   Patient Instructions: 1)  rtc 6 WEEKS    Prescriptions: ZOLOFT 100 MG  TABS (SERTRALINE HCL) one tab  #30 x 12   Entered and Authorized by:   Shaune Leeks MD   Signed by:   Shaune Leeks MD on 05/24/2007   Method used:   Print then Give to Patient   RxID:   [id number]  ]

## 2010-12-17 ENCOUNTER — Other Ambulatory Visit: Payer: Self-pay | Admitting: Family Medicine

## 2010-12-22 ENCOUNTER — Other Ambulatory Visit: Payer: Self-pay | Admitting: *Deleted

## 2010-12-22 NOTE — Telephone Encounter (Signed)
Opened in error

## 2011-01-05 ENCOUNTER — Other Ambulatory Visit: Payer: Self-pay | Admitting: *Deleted

## 2011-01-05 MED ORDER — LISINOPRIL 10 MG PO TABS: 10.0000 mg | ORAL_TABLET | Freq: Every day | ORAL | Status: DC

## 2011-01-05 MED ORDER — ROPINIROLE HCL 1 MG PO TABS: ORAL_TABLET | ORAL | Status: DC

## 2011-01-17 ENCOUNTER — Encounter: Payer: Self-pay | Admitting: Family Medicine

## 2011-01-22 ENCOUNTER — Ambulatory Visit (INDEPENDENT_AMBULATORY_CARE_PROVIDER_SITE_OTHER): Payer: 59 | Admitting: Family Medicine

## 2011-01-22 ENCOUNTER — Encounter: Payer: Self-pay | Admitting: Family Medicine

## 2011-01-22 DIAGNOSIS — G2581 Restless legs syndrome: Secondary | ICD-10-CM

## 2011-01-22 DIAGNOSIS — F3289 Other specified depressive episodes: Secondary | ICD-10-CM

## 2011-01-22 DIAGNOSIS — I10 Essential (primary) hypertension: Secondary | ICD-10-CM

## 2011-01-22 DIAGNOSIS — E7800 Pure hypercholesterolemia, unspecified: Secondary | ICD-10-CM

## 2011-01-22 DIAGNOSIS — F411 Generalized anxiety disorder: Secondary | ICD-10-CM

## 2011-01-22 DIAGNOSIS — F329 Major depressive disorder, single episode, unspecified: Secondary | ICD-10-CM

## 2011-01-22 MED ORDER — ROPINIROLE HCL 1 MG PO TABS: ORAL_TABLET | ORAL | Status: DC

## 2011-01-22 MED ORDER — LISINOPRIL 10 MG PO TABS: 10.0000 mg | ORAL_TABLET | Freq: Every day | ORAL | Status: DC

## 2011-01-22 MED ORDER — SIMVASTATIN 80 MG PO TABS: 80.0000 mg | ORAL_TABLET | Freq: Every day | ORAL | Status: DC

## 2011-01-22 NOTE — Patient Instructions (Signed)
RTC 3-4 mos for PE, labs prior.

## 2011-01-22 NOTE — Assessment & Plan Note (Signed)
Good control. Cont Lisinopril.

## 2011-01-22 NOTE — Assessment & Plan Note (Signed)
Resolved

## 2011-01-22 NOTE — Progress Notes (Signed)
  Subjective:    Patient ID: Karen Osborne, female    DOB: [birth year], 55 y.o.   MRN: [id number]  HPI Pt here for recheck and prescription renewal. She was seen for PE approx 14 mos ago. She feels well and has no complaints. She was recently seen by Levin Erp at [facility] and had nml exam. She had Pap and Dexa at [facility] and was told the mammo was ok, hadn't heard about the DEXA. She has stopped the Xanax and the Effexor. She takes Requip nightly, usually one and occas 2. She also continues the Lisinopril and the SImvastatin regularly.    Review of Systems  Constitutional: Negative for fever, chills, diaphoresis, activity change and fatigue.  HENT: Negative for ear pain, congestion, rhinorrhea and postnasal drip.   Eyes: Negative for redness.  Respiratory: Negative for cough, chest tightness, shortness of breath and wheezing.   Cardiovascular: Negative for chest pain.       Objective:   Physical Exam  Constitutional: She appears well-developed and well-nourished. No distress.  HENT:  Head: Normocephalic and atraumatic.  Right Ear: External ear normal.  Left Ear: External ear normal.  Nose: Nose normal.  Mouth/Throat: Oropharynx is clear and moist. No oropharyngeal exudate.  Eyes: Conjunctivae and EOM are normal. Pupils are equal, round, and reactive to light.  Neck: Normal range of motion. Neck supple. No thyromegaly present.  Cardiovascular: Normal rate, regular rhythm and normal heart sounds.   Pulmonary/Chest: Effort normal and breath sounds normal. She has no wheezes. She has no rales.  Lymphadenopathy:    She has no cervical adenopathy.  Skin: She is not diaphoretic.          Assessment & Plan:

## 2011-01-22 NOTE — Assessment & Plan Note (Signed)
Reasonably controlled. Cont curr meds.

## 2011-01-22 NOTE — Assessment & Plan Note (Signed)
Seems well controlled off medication. Will consider resolved.

## 2011-01-22 NOTE — Assessment & Plan Note (Signed)
Cont Simva. RTC 3-4 mos for PE and will recheck then.

## 2011-11-03 ENCOUNTER — Encounter: Payer: Self-pay | Admitting: Family Medicine

## 2011-11-03 ENCOUNTER — Ambulatory Visit (INDEPENDENT_AMBULATORY_CARE_PROVIDER_SITE_OTHER): Payer: 59 | Admitting: Family Medicine

## 2011-11-03 VITALS — BP 142/86 | HR 88 | Temp 98.3°F | Ht 67.0 in | Wt 189.8 lb

## 2011-11-03 DIAGNOSIS — E538 Deficiency of other specified B group vitamins: Secondary | ICD-10-CM

## 2011-11-03 DIAGNOSIS — F418 Other specified anxiety disorders: Secondary | ICD-10-CM | POA: Insufficient documentation

## 2011-11-03 DIAGNOSIS — F329 Major depressive disorder, single episode, unspecified: Secondary | ICD-10-CM

## 2011-11-03 DIAGNOSIS — R5383 Other fatigue: Secondary | ICD-10-CM | POA: Insufficient documentation

## 2011-11-03 DIAGNOSIS — F17200 Nicotine dependence, unspecified, uncomplicated: Secondary | ICD-10-CM

## 2011-11-03 DIAGNOSIS — F32A Depression, unspecified: Secondary | ICD-10-CM

## 2011-11-03 DIAGNOSIS — F3289 Other specified depressive episodes: Secondary | ICD-10-CM

## 2011-11-03 DIAGNOSIS — M810 Age-related osteoporosis without current pathological fracture: Secondary | ICD-10-CM

## 2011-11-03 DIAGNOSIS — I10 Essential (primary) hypertension: Secondary | ICD-10-CM

## 2011-11-03 DIAGNOSIS — R5381 Other malaise: Secondary | ICD-10-CM

## 2011-11-03 MED ORDER — CITALOPRAM HYDROBROMIDE 10 MG PO TABS: 10.0000 mg | ORAL_TABLET | Freq: Every day | ORAL | Status: DC

## 2011-11-03 NOTE — Patient Instructions (Signed)
Start medicine called citalopram 10mg  daily. Takes 3-4 weeks to take effect. Return in 4-6 weeks for follow up. Sounds like you have a lot on your plate right now!  Hang in there.  Let us know sooner if any questions. Blood work today.

## 2011-11-03 NOTE — Assessment & Plan Note (Signed)
Discussed importance of cessation. Given stress currently, will defer further discussion. Pt notes brother had good results with chantix.

## 2011-11-03 NOTE — Assessment & Plan Note (Signed)
Discussed mood - several month history of depressed mood. Anticipate major depression returning. No SI/HI. Discussed meds will start SSRI celexa as has responded well to SSRI (zoloft) in past. Discussed common side effects. rtc 1 mo for f/u. PHQ 9 = 23/27, making it extremely difficult to function. GAD 7 = 20/21.

## 2011-11-03 NOTE — Assessment & Plan Note (Signed)
states told by prior PCP did not need actonel anymore.

## 2011-11-03 NOTE — Assessment & Plan Note (Signed)
Reviewed blood work, last TSH was 0.33, last B12 low at 189.  (2008) Recheck levels, discussed start b12 supplementation. Recheck blood work for reversible causes of fatigue.   Anticipate depression contributing significantly

## 2011-11-03 NOTE — Progress Notes (Signed)
  Subjective:    Patient ID: Karen Osborne, female    DOB: 1955/12/02, 56 y.o.   MRN: [id number]  HPI CC: transfer of care from Dr. Hetty Ely.  Feels depression returning.  Feeling on edge, anxious, trouble focusing/concentrating.  Has been getting worse for last few months.  ++financial concerns, husband's self- employed and trouble during winter months.  Insomnia - takes tylenol pm and helps with initiation but continued sleep maintenance issues.  + anhedonia.  Decreased energy.  Appetite increased for junk food.  No SI/HI.  No guilt.  Has tried wellbutrin in past which didn't help, zoloft did pretty good.  Anxiety - stress at work.  Gets home and just wants to go to sleep.  Feeling knot in back between shoulder blades.  Smoking - 1 ppd.  Brother just quit using chantix.  precontemplative currently, feels stress unable to quit.  Wt Readings from Last 3 Encounters:  11/03/11 189 lb 12 oz (86.07 kg)  01/22/11 191 lb 8 oz (86.864 kg)  08/14/09 187 lb (84.823 kg)   Preventative: No recent blood work. Has well woman pending 12/2011.  Due for mammogram, colonoscopy at that time.  Will schedule. Sees Alicia Copland for well woman at [facility].  Past Medical History  Diagnosis Date  . Depression   . Hypertension   . Osteopenia   . NSVD (normal spontaneous vaginal delivery) 1988     Review of Systems per HPI    Objective:   Physical Exam  Nursing note and vitals reviewed. Constitutional: She appears well-developed and well-nourished. No distress.  HENT:  Head: Normocephalic and atraumatic.  Mouth/Throat: Oropharynx is clear and moist. No oropharyngeal exudate.  Eyes: Conjunctivae and EOM are normal. Pupils are equal, round, and reactive to light. No scleral icterus.  Neck: Normal range of motion. Neck supple. No thyromegaly present.  Cardiovascular: Normal rate, regular rhythm, normal heart sounds and intact distal pulses.   No murmur heard. Pulmonary/Chest: Effort normal and  breath sounds normal. No respiratory distress. She has no wheezes. She has no rales.  Musculoskeletal: She exhibits no edema.  Lymphadenopathy:    She has no cervical adenopathy.  Skin: Skin is warm and dry. No rash noted.  Psychiatric: She has a normal mood and affect.       Assessment & Plan:

## 2011-11-04 ENCOUNTER — Other Ambulatory Visit: Payer: Self-pay | Admitting: Family Medicine

## 2011-11-04 LAB — CBC WITH DIFFERENTIAL/PLATELET
Basophils Absolute: 0 10*3/uL (ref 0.0–0.1)
Basophils Relative: 0.2 % (ref 0.0–3.0)
Eosinophils Absolute: 0.4 10*3/uL (ref 0.0–0.7)
Eosinophils Relative: 3.5 % (ref 0.0–5.0)
HCT: 46.2 % — ABNORMAL HIGH (ref 36.0–46.0)
Hemoglobin: 16 g/dL — ABNORMAL HIGH (ref 12.0–15.0)
Lymphocytes Relative: 26.6 % (ref 12.0–46.0)
Lymphs Abs: 3.1 10*3/uL (ref 0.7–4.0)
MCHC: 34.6 g/dL (ref 30.0–36.0)
MCV: 91.2 fl (ref 78.0–100.0)
Monocytes Absolute: 0.7 10*3/uL (ref 0.1–1.0)
Monocytes Relative: 5.8 % (ref 3.0–12.0)
Neutro Abs: 7.6 10*3/uL (ref 1.4–7.7)
Neutrophils Relative %: 63.9 % (ref 43.0–77.0)
Platelets: 247 10*3/uL (ref 150.0–400.0)
RBC: 5.06 Mil/uL (ref 3.87–5.11)
RDW: 13 % (ref 11.5–14.6)
WBC: 11.8 10*3/uL — ABNORMAL HIGH (ref 4.5–10.5)

## 2011-11-04 LAB — COMPREHENSIVE METABOLIC PANEL
ALT: 13 U/L (ref 0–35)
AST: 19 U/L (ref 0–37)
Albumin: 4 g/dL (ref 3.5–5.2)
Alkaline Phosphatase: 103 U/L (ref 39–117)
BUN: 9 mg/dL (ref 6–23)
CO2: 25 mEq/L (ref 19–32)
Calcium: 9.1 mg/dL (ref 8.4–10.5)
Chloride: 105 mEq/L (ref 96–112)
Creatinine, Ser: 0.7 mg/dL (ref 0.4–1.2)
GFR: 90.85 mL/min (ref >60.00)
Glucose, Bld: 90 mg/dL (ref 70–99)
Potassium: 4.5 mEq/L (ref 3.5–5.1)
Sodium: 141 mEq/L (ref 135–145)
Total Bilirubin: 0.6 mg/dL (ref 0.3–1.2)
Total Protein: 7 g/dL (ref 6.0–8.3)

## 2011-11-04 LAB — T4, FREE: Free T4: 1.14 ng/dL (ref 0.60–1.60)

## 2011-11-04 LAB — TSH: TSH: 0.65 u[IU]/mL (ref 0.35–5.50)

## 2011-11-04 LAB — VITAMIN B12: Vitamin B-12: 197 pg/mL — ABNORMAL LOW (ref 211–911)

## 2011-11-10 ENCOUNTER — Ambulatory Visit (INDEPENDENT_AMBULATORY_CARE_PROVIDER_SITE_OTHER): Payer: 59 | Admitting: Family Medicine

## 2011-11-10 DIAGNOSIS — E538 Deficiency of other specified B group vitamins: Secondary | ICD-10-CM

## 2011-11-10 MED ORDER — CYANOCOBALAMIN 1000 MCG/ML IJ SOLN
1000.0000 ug | Freq: Once | INTRAMUSCULAR | Status: AC
  Administered 2011-11-10: 1000 ug via INTRAMUSCULAR

## 2011-11-10 NOTE — Progress Notes (Signed)
b12 shot 

## 2011-11-19 ENCOUNTER — Ambulatory Visit: Payer: 59

## 2011-11-24 ENCOUNTER — Ambulatory Visit (INDEPENDENT_AMBULATORY_CARE_PROVIDER_SITE_OTHER): Payer: 59 | Admitting: *Deleted

## 2011-11-24 DIAGNOSIS — E538 Deficiency of other specified B group vitamins: Secondary | ICD-10-CM

## 2011-11-24 MED ORDER — CYANOCOBALAMIN 1000 MCG/ML IJ SOLN
1000.0000 ug | Freq: Once | INTRAMUSCULAR | Status: AC
  Administered 2011-11-24: 1000 ug via INTRAMUSCULAR

## 2011-12-01 ENCOUNTER — Ambulatory Visit: Payer: 59

## 2011-12-02 ENCOUNTER — Encounter: Payer: Self-pay | Admitting: Family Medicine

## 2011-12-02 ENCOUNTER — Ambulatory Visit (INDEPENDENT_AMBULATORY_CARE_PROVIDER_SITE_OTHER): Payer: 59 | Admitting: Family Medicine

## 2011-12-02 VITALS — BP 130/84 | HR 60 | Temp 98.5°F | Wt 188.8 lb

## 2011-12-02 DIAGNOSIS — F329 Major depressive disorder, single episode, unspecified: Secondary | ICD-10-CM

## 2011-12-02 DIAGNOSIS — E538 Deficiency of other specified B group vitamins: Secondary | ICD-10-CM

## 2011-12-02 DIAGNOSIS — F17200 Nicotine dependence, unspecified, uncomplicated: Secondary | ICD-10-CM

## 2011-12-02 DIAGNOSIS — F32A Depression, unspecified: Secondary | ICD-10-CM

## 2011-12-02 DIAGNOSIS — F3289 Other specified depressive episodes: Secondary | ICD-10-CM

## 2011-12-02 MED ORDER — CYANOCOBALAMIN 1000 MCG/ML IJ SOLN
1000.0000 ug | Freq: Once | INTRAMUSCULAR | Status: AC
  Administered 2011-12-02: 1000 ug via INTRAMUSCULAR

## 2011-12-02 MED ORDER — CITALOPRAM HYDROBROMIDE 10 MG PO TABS: 10.0000 mg | ORAL_TABLET | Freq: Every day | ORAL | Status: DC

## 2011-12-02 NOTE — Progress Notes (Signed)
Addended by: Josph Macho A on: 12/02/2011 08:40 AM   Modules accepted: Orders

## 2011-12-02 NOTE — Progress Notes (Addendum)
  Subjective:    Patient ID: Karen Osborne, female    DOB: June 11, 1956, 56 y.o.   MRN: [id number]  HPI CC: 1 mo f/u  Here for f/u depression and fatigue.  Found to be B12 deficient with levels in high 100s.  Started on B12 shot (weekly for 1 mo then monthly).  Started on celexa 10mg .  Initially some nausea but now improved.  Improved energy as well as overall feeling better.  Insomnia better.  Appetite ok.  No anhedonia.  Stress improved but still present.  Overall improved but still some room for improvement, but would like to stay at current dose for next few weeks to see how she does.  GAD7 last visit consistent with severe anxiety present as well.    Monday is 25th wedding anniversary.  Continues smoking - 1 ppd smoker.  Brother used chantix.  Review of Systems Per HPI    Objective:   Physical Exam  Nursing note and vitals reviewed. Constitutional: She appears well-developed and well-nourished. No distress.  Psychiatric: She has a normal mood and affect. Her behavior is normal. Judgment and thought content normal.       Brighter affect today       Assessment & Plan:

## 2011-12-02 NOTE — Assessment & Plan Note (Signed)
PHQ9 = 23/27 --> 13/27 GAD7 = 20/21 --> 12/21 Improved, continue current dose. Discussed increase celexa to 20mg   , pt will call me if desires increase. rtc 6 mo or as needed.

## 2011-12-02 NOTE — Patient Instructions (Signed)
Good to see you today. b12 shot today then next week then monthly for a year. Continue celexa for now - if you decide to double dose, let me know and I will send in higher dose. Return as needed or in 6 months for follow up on smoking and mood.

## 2011-12-02 NOTE — Assessment & Plan Note (Signed)
Continue to encourage cessation. precontemplative. 

## 2011-12-08 ENCOUNTER — Ambulatory Visit (INDEPENDENT_AMBULATORY_CARE_PROVIDER_SITE_OTHER): Payer: 59

## 2011-12-08 DIAGNOSIS — E538 Deficiency of other specified B group vitamins: Secondary | ICD-10-CM

## 2011-12-08 MED ORDER — CYANOCOBALAMIN 1000 MCG/ML IJ SOLN
1000.0000 ug | Freq: Once | INTRAMUSCULAR | Status: AC
  Administered 2011-12-08: 1000 ug via INTRAMUSCULAR

## 2012-01-08 ENCOUNTER — Ambulatory Visit: Payer: 59

## 2012-01-12 ENCOUNTER — Ambulatory Visit (INDEPENDENT_AMBULATORY_CARE_PROVIDER_SITE_OTHER): Payer: 59 | Admitting: *Deleted

## 2012-01-12 DIAGNOSIS — R5383 Other fatigue: Secondary | ICD-10-CM

## 2012-01-12 DIAGNOSIS — R5381 Other malaise: Secondary | ICD-10-CM

## 2012-01-12 MED ORDER — CYANOCOBALAMIN 1000 MCG/ML IJ SOLN
1000.0000 ug | Freq: Once | INTRAMUSCULAR | Status: AC
  Administered 2012-01-12: 1000 ug via INTRAMUSCULAR

## 2012-01-27 ENCOUNTER — Encounter: Payer: Self-pay | Admitting: Family Medicine

## 2012-01-27 ENCOUNTER — Inpatient Hospital Stay (HOSPITAL_COMMUNITY)
Admission: EM | Admit: 2012-01-27 | Discharge: 2012-01-29 | DRG: 247 | Disposition: A | Discharge status: Home / Self Care | Payer: 59 | Attending: Cardiology | Admitting: Cardiology

## 2012-01-27 ENCOUNTER — Emergency Department (HOSPITAL_COMMUNITY): Payer: 59

## 2012-01-27 ENCOUNTER — Encounter (HOSPITAL_COMMUNITY): Payer: Self-pay

## 2012-01-27 ENCOUNTER — Other Ambulatory Visit: Payer: Self-pay

## 2012-01-27 ENCOUNTER — Telehealth: Payer: Self-pay | Admitting: Family Medicine

## 2012-01-27 ENCOUNTER — Inpatient Hospital Stay (HOSPITAL_COMMUNITY): Payer: 59

## 2012-01-27 ENCOUNTER — Encounter (HOSPITAL_COMMUNITY)
Admission: EM | Disposition: A | Discharge status: Home / Self Care | Payer: Self-pay | Source: Home / Self Care | Attending: Cardiology

## 2012-01-27 ENCOUNTER — Ambulatory Visit (INDEPENDENT_AMBULATORY_CARE_PROVIDER_SITE_OTHER): Payer: 59 | Admitting: Family Medicine

## 2012-01-27 DIAGNOSIS — I10 Essential (primary) hypertension: Secondary | ICD-10-CM | POA: Insufficient documentation

## 2012-01-27 DIAGNOSIS — I2510 Atherosclerotic heart disease of native coronary artery without angina pectoris: Secondary | ICD-10-CM

## 2012-01-27 DIAGNOSIS — Z79899 Other long term (current) drug therapy: Secondary | ICD-10-CM

## 2012-01-27 DIAGNOSIS — M899 Disorder of bone, unspecified: Secondary | ICD-10-CM | POA: Diagnosis present

## 2012-01-27 DIAGNOSIS — F17200 Nicotine dependence, unspecified, uncomplicated: Secondary | ICD-10-CM | POA: Insufficient documentation

## 2012-01-27 DIAGNOSIS — R7309 Other abnormal glucose: Secondary | ICD-10-CM | POA: Diagnosis present

## 2012-01-27 DIAGNOSIS — Z23 Encounter for immunization: Secondary | ICD-10-CM

## 2012-01-27 DIAGNOSIS — R079 Chest pain, unspecified: Secondary | ICD-10-CM

## 2012-01-27 DIAGNOSIS — I252 Old myocardial infarction: Secondary | ICD-10-CM

## 2012-01-27 DIAGNOSIS — Z955 Presence of coronary angioplasty implant and graft: Secondary | ICD-10-CM

## 2012-01-27 DIAGNOSIS — M949 Disorder of cartilage, unspecified: Secondary | ICD-10-CM | POA: Diagnosis present

## 2012-01-27 DIAGNOSIS — G2581 Restless legs syndrome: Secondary | ICD-10-CM | POA: Diagnosis present

## 2012-01-27 DIAGNOSIS — Z7982 Long term (current) use of aspirin: Secondary | ICD-10-CM

## 2012-01-27 DIAGNOSIS — F3289 Other specified depressive episodes: Secondary | ICD-10-CM | POA: Diagnosis present

## 2012-01-27 DIAGNOSIS — I214 Non-ST elevation (NSTEMI) myocardial infarction: Secondary | ICD-10-CM

## 2012-01-27 DIAGNOSIS — I059 Rheumatic mitral valve disease, unspecified: Secondary | ICD-10-CM | POA: Diagnosis present

## 2012-01-27 DIAGNOSIS — E785 Hyperlipidemia, unspecified: Secondary | ICD-10-CM | POA: Insufficient documentation

## 2012-01-27 DIAGNOSIS — E1169 Type 2 diabetes mellitus with other specified complication: Secondary | ICD-10-CM | POA: Insufficient documentation

## 2012-01-27 DIAGNOSIS — F329 Major depressive disorder, single episode, unspecified: Secondary | ICD-10-CM | POA: Diagnosis present

## 2012-01-27 DIAGNOSIS — E7800 Pure hypercholesterolemia, unspecified: Secondary | ICD-10-CM | POA: Diagnosis present

## 2012-01-27 HISTORY — DX: Tobacco use: Z72.0

## 2012-01-27 HISTORY — DX: Pure hypercholesterolemia, unspecified: E78.00

## 2012-01-27 HISTORY — DX: Acute myocardial infarction, unspecified: I21.9

## 2012-01-27 HISTORY — PX: CORONARY ANGIOPLASTY WITH STENT PLACEMENT: SHX49

## 2012-01-27 HISTORY — DX: Atherosclerotic heart disease of native coronary artery without angina pectoris: I25.10

## 2012-01-27 HISTORY — PX: PERCUTANEOUS CORONARY STENT INTERVENTION (PCI-S): SHX5485

## 2012-01-27 HISTORY — PX: LEFT HEART CATHETERIZATION WITH CORONARY ANGIOGRAM: SHX5451

## 2012-01-27 HISTORY — DX: Restless legs syndrome: G25.81

## 2012-01-27 LAB — CBC
HCT: 44 % (ref 36.0–46.0)
Hemoglobin: 15.3 g/dL — ABNORMAL HIGH (ref 12.0–15.0)
MCH: 31.4 pg (ref 26.0–34.0)
MCHC: 34.8 g/dL (ref 30.0–36.0)
MCV: 90.3 fL (ref 78.0–100.0)
Platelets: 234 10*3/uL (ref 150–400)
RBC: 4.87 MIL/uL (ref 3.87–5.11)
RDW: 12.9 % (ref 11.5–15.5)
WBC: 10.2 10*3/uL (ref 4.0–10.5)

## 2012-01-27 LAB — CARDIAC PANEL(CRET KIN+CKTOT+MB+TROPI)
CK, MB: 36.4 ng/mL (ref 0.3–4.0)
CK, MB: 27.9 ng/mL (ref 0.3–4.0)
Relative Index: 9.9 — ABNORMAL HIGH (ref 0.0–2.5)
Relative Index: 8.7 — ABNORMAL HIGH (ref 0.0–2.5)
Total CK: 368 U/L — ABNORMAL HIGH (ref 7–177)
Total CK: 319 U/L — ABNORMAL HIGH (ref 7–177)
Troponin I: 24.31 ng/mL (ref <0.30)
Troponin I: 11.92 ng/mL (ref <0.30)

## 2012-01-27 LAB — PROTIME-INR
INR: 1.61 — ABNORMAL HIGH (ref 0.00–1.49)
Prothrombin Time: 19.4 seconds — ABNORMAL HIGH (ref 11.6–15.2)

## 2012-01-27 LAB — BASIC METABOLIC PANEL
BUN: 8 mg/dL (ref 6–23)
CO2: 24 mEq/L (ref 19–32)
Calcium: 9 mg/dL (ref 8.4–10.5)
Chloride: 108 mEq/L (ref 96–112)
Creatinine, Ser: 0.57 mg/dL (ref 0.50–1.10)
GFR calc Af Amer: >90 mL/min (ref >90)
GFR calc non Af Amer: >90 mL/min (ref >90)
Glucose, Bld: 95 mg/dL (ref 70–99)
Potassium: 4 mEq/L (ref 3.5–5.1)
Sodium: 141 mEq/L (ref 135–145)

## 2012-01-27 LAB — TROPONIN I: Troponin I: 19.87 ng/mL (ref <0.30)

## 2012-01-27 LAB — POCT ACTIVATED CLOTTING TIME: Activated Clotting Time: 617 seconds

## 2012-01-27 LAB — TSH: TSH: 0.333 u[IU]/mL — ABNORMAL LOW (ref 0.350–4.500)

## 2012-01-27 SURGERY — LEFT HEART CATHETERIZATION WITH CORONARY ANGIOGRAM
Anesthesia: LOCAL

## 2012-01-27 MED ORDER — ASPIRIN 81 MG PO CHEW: 324.0000 mg | CHEWABLE_TABLET | ORAL | Status: DC

## 2012-01-27 MED ORDER — METOPROLOL TARTRATE 12.5 MG HALF TABLET
12.5000 mg | ORAL_TABLET | Freq: Four times a day (QID) | ORAL | Status: DC
  Administered 2012-01-27 – 2012-01-28 (×2): 12.5 mg via ORAL
  Filled 2012-01-27 (×7): qty 1

## 2012-01-27 MED ORDER — GI COCKTAIL ~~LOC~~: 30.0000 mL | Freq: Once | ORAL | Status: DC

## 2012-01-27 MED ORDER — MIDAZOLAM HCL 2 MG/2ML IJ SOLN
INTRAMUSCULAR | Status: AC
  Filled 2012-01-27: qty 2

## 2012-01-27 MED ORDER — ASPIRIN 81 MG PO CHEW: 324.0000 mg | CHEWABLE_TABLET | Freq: Once | ORAL | Status: DC

## 2012-01-27 MED ORDER — ZOLPIDEM TARTRATE 5 MG PO TABS: 10.0000 mg | ORAL_TABLET | Freq: Every evening | ORAL | Status: DC | PRN

## 2012-01-27 MED ORDER — NITROGLYCERIN 0.2 MG/ML ON CALL CATH LAB
INTRAVENOUS | Status: AC
  Filled 2012-01-27: qty 1

## 2012-01-27 MED ORDER — SODIUM CHLORIDE 0.9 % IV SOLN: 250.0000 mL | INTRAVENOUS | Status: DC | PRN

## 2012-01-27 MED ORDER — ASPIRIN EC 81 MG PO TBEC
81.0000 mg | DELAYED_RELEASE_TABLET | Freq: Every day | ORAL | Status: DC
  Administered 2012-01-28 – 2012-01-29 (×2): 81 mg via ORAL
  Filled 2012-01-27 (×2): qty 1

## 2012-01-27 MED ORDER — LISINOPRIL 10 MG PO TABS
10.0000 mg | ORAL_TABLET | Freq: Every day | ORAL | Status: DC
  Administered 2012-01-27 – 2012-01-28 (×2): 10 mg via ORAL
  Filled 2012-01-27 (×3): qty 1

## 2012-01-27 MED ORDER — HEPARIN (PORCINE) IN NACL 2-0.9 UNIT/ML-% IJ SOLN
INTRAMUSCULAR | Status: AC
  Filled 2012-01-27: qty 2000

## 2012-01-27 MED ORDER — SODIUM CHLORIDE 0.9 % IV SOLN: 1.0000 mL/kg/h | INTRAVENOUS | Status: DC

## 2012-01-27 MED ORDER — HEPARIN (PORCINE) IN NACL 100-0.45 UNIT/ML-% IJ SOLN
1100.0000 [IU]/h | INTRAMUSCULAR | Status: DC
  Administered 2012-01-27: 1100 [IU]/h via INTRAVENOUS
  Filled 2012-01-27: qty 250

## 2012-01-27 MED ORDER — ACETAMINOPHEN 325 MG PO TABS: 650.0000 mg | ORAL_TABLET | ORAL | Status: DC | PRN

## 2012-01-27 MED ORDER — SODIUM CHLORIDE 0.9 % IJ SOLN: 3.0000 mL | INTRAMUSCULAR | Status: DC | PRN

## 2012-01-27 MED ORDER — HEPARIN SODIUM (PORCINE) 1000 UNIT/ML IJ SOLN
INTRAMUSCULAR | Status: AC
  Filled 2012-01-27: qty 1

## 2012-01-27 MED ORDER — NITROGLYCERIN 0.4 MG SL SUBL: 0.4000 mg | SUBLINGUAL_TABLET | SUBLINGUAL | Status: DC | PRN

## 2012-01-27 MED ORDER — ATORVASTATIN CALCIUM 80 MG PO TABS
80.0000 mg | ORAL_TABLET | Freq: Every day | ORAL | Status: DC
  Administered 2012-01-27 – 2012-01-28 (×2): 80 mg via ORAL
  Filled 2012-01-27 (×3): qty 1

## 2012-01-27 MED ORDER — PRASUGREL HCL 10 MG PO TABS
ORAL_TABLET | ORAL | Status: AC
  Filled 2012-01-27: qty 6

## 2012-01-27 MED ORDER — SODIUM CHLORIDE 0.9 % IJ SOLN
3.0000 mL | Freq: Two times a day (BID) | INTRAMUSCULAR | Status: DC
  Administered 2012-01-27 – 2012-01-28 (×3): 3 mL via INTRAVENOUS

## 2012-01-27 MED ORDER — BIVALIRUDIN 250 MG IV SOLR
INTRAVENOUS | Status: AC
  Filled 2012-01-27: qty 250

## 2012-01-27 MED ORDER — LIDOCAINE HCL (PF) 1 % IJ SOLN
INTRAMUSCULAR | Status: AC
  Filled 2012-01-27: qty 30

## 2012-01-27 MED ORDER — GI COCKTAIL ~~LOC~~
30.0000 mL | Freq: Once | ORAL | Status: DC
  Administered 2012-01-27: 30 mL via ORAL

## 2012-01-27 MED ORDER — ONDANSETRON HCL 4 MG/2ML IJ SOLN: 4.0000 mg | Freq: Four times a day (QID) | INTRAMUSCULAR | Status: DC | PRN

## 2012-01-27 MED ORDER — ASPIRIN 81 MG PO CHEW
324.0000 mg | CHEWABLE_TABLET | Freq: Once | ORAL | Status: AC
  Administered 2012-01-27: 324 mg via ORAL

## 2012-01-27 MED ORDER — DIAZEPAM 5 MG PO TABS: 5.0000 mg | ORAL_TABLET | ORAL | Status: AC

## 2012-01-27 MED ORDER — HEPARIN BOLUS VIA INFUSION
4000.0000 [IU] | Freq: Once | INTRAVENOUS | Status: AC
  Administered 2012-01-27: 4000 [IU] via INTRAVENOUS

## 2012-01-27 MED ORDER — HEPARIN (PORCINE) IN NACL 100-0.45 UNIT/ML-% IJ SOLN: 1000.0000 [IU]/h | Freq: Once | INTRAMUSCULAR | Status: DC

## 2012-01-27 MED ORDER — SODIUM CHLORIDE 0.9 % IV SOLN
INTRAVENOUS | Status: AC
  Administered 2012-01-27: 16:00:00 via INTRAVENOUS

## 2012-01-27 MED ORDER — NITROGLYCERIN 2 % TD OINT
0.5000 [in_us] | TOPICAL_OINTMENT | Freq: Four times a day (QID) | TRANSDERMAL | Status: DC
  Administered 2012-01-27 – 2012-01-28 (×4): 0.5 [in_us] via TOPICAL
  Filled 2012-01-27 (×5): qty 30

## 2012-01-27 MED ORDER — PNEUMOCOCCAL VAC POLYVALENT 25 MCG/0.5ML IJ INJ
0.5000 mL | INJECTION | Freq: Once | INTRAMUSCULAR | Status: AC
  Administered 2012-01-27: 0.5 mL via INTRAMUSCULAR
  Filled 2012-01-27: qty 0.5

## 2012-01-27 MED ORDER — CITALOPRAM HYDROBROMIDE 10 MG PO TABS
10.0000 mg | ORAL_TABLET | Freq: Every day | ORAL | Status: DC
  Administered 2012-01-28 – 2012-01-29 (×2): 10 mg via ORAL
  Filled 2012-01-27 (×3): qty 1

## 2012-01-27 MED ORDER — ROPINIROLE HCL 1 MG PO TABS
1.0000 mg | ORAL_TABLET | Freq: Every day | ORAL | Status: DC | PRN
  Administered 2012-01-27 – 2012-01-28 (×2): 1 mg via ORAL
  Filled 2012-01-27: qty 2

## 2012-01-27 MED ORDER — FENTANYL CITRATE 0.05 MG/ML IJ SOLN
INTRAMUSCULAR | Status: AC
  Filled 2012-01-27: qty 2

## 2012-01-27 MED ORDER — PRASUGREL HCL 10 MG PO TABS
10.0000 mg | ORAL_TABLET | Freq: Every day | ORAL | Status: DC
  Administered 2012-01-28 – 2012-01-29 (×2): 10 mg via ORAL
  Filled 2012-01-27 (×2): qty 1

## 2012-01-27 NOTE — ED Notes (Signed)
Patient transported to cath lab. Belongings remain with patient's sister.

## 2012-01-27 NOTE — Progress Notes (Signed)
MI teaching provided in length, pt tearful, admits anxiety about diagnosis, emotional reassurance provided to patient and family.  Discussed plan of care, heart healthy diet, radial cath care, when to call MD, smoking cessation, medications, stent card.  Has effient packet.  States smokes 1 1/2 PPD since age 56 and "I want to quit smoking but not sure I can."  Encouragement given as well as tips for success.  Denies questions and refused to watch MI video, states "maybe in morning."

## 2012-01-27 NOTE — Progress Notes (Signed)
ANTICOAGULATION CONSULT NOTE - Initial Consult  Pharmacy Consult for Heparin Indication: chest pain/ACS  No Known Allergies  Patient Measurements: Weight: 189 lb (85.73 kg) Heparin Dosing Weight: 80kg  Vital Signs: Temp: 98.6 F (37 C) (05/15 1116) Temp src: Oral (05/15 1116) BP: 135/71 mmHg (05/15 1306) Pulse Rate: 73  (05/15 1116)  Labs:  Basename 01/27/12 1150  HGB 15.3*  HCT 44.0  PLT 234  APTT --  LABPROT --  INR --  HEPARINUNFRC --  CREATININE 0.57  CKTOTAL --  CKMB --  TROPONINI 19.87*    The CrCl is unknown because both a height and weight (above a minimum accepted value) are required for this calculation.   Medical History: Past Medical History  Diagnosis Date  . Depression   . Hypertension   . Osteopenia   . NSVD (normal spontaneous vaginal delivery) 1988  . Smoker   . Hypercholesteremia   . Restless leg syndrome     Medications:  Heparin 1100 units/hr  Assessment: 56 yof with gradual onset of chest pain that started on Saturday. Patient does have risk factors of hypertension, hyperlipidemia and a long standing smoking history. Patient was on acei and statin pta but no aspirin, which has now been ordered. Troponins are elevated will start IV heparin. Baseline CBC within normal limits.  Goal of Therapy:  Heparin level 0.3-0.7 units/ml Monitor platelets by anticoagulation protocol: Yes   Plan:  Heparin bolus of 4000 units x 1 then start IV infusion at 1100 units/hr Check HL/CBC Daily Severiano Gilbert 01/27/2012,1:40 PM

## 2012-01-27 NOTE — ED Notes (Signed)
Heparin was verified with Tammy Sours, RN

## 2012-01-27 NOTE — ED Notes (Signed)
Lab called panic troponin 19.87 reported to Dr Juleen China.

## 2012-01-27 NOTE — Interval H&P Note (Signed)
History and Physical Interval Note:  01/27/2012 2:11 PM  Karen Osborne  has presented today for surgery, with the diagnosis of Chest pain  The various methods of treatment have been discussed with the patient and family. After consideration of risks, benefits and other options for treatment, the patient has consented to  Procedure(s) (LRB): LEFT HEART CATHETERIZATION WITH CORONARY ANGIOGRAM (N/A) as a surgical intervention .  The patients' history has been reviewed, patient examined, no change in status, stable for surgery.  I have reviewed the patients' chart and labs.  Questions were answered to the patient's satisfaction.     Jerrilyn Messinger

## 2012-01-27 NOTE — ED Notes (Signed)
Per EMS: Patient from PCP's office, patient reporting left sided chest pain intermittently since this past Saturday with cough. Patient had ST elevation in V2 at PCP's office and ST elevation in V3 by EMS. Patient had 324 of ASA at PCP's office and a total of 2 SL nitro with complete relief of pain.

## 2012-01-27 NOTE — Patient Instructions (Addendum)
Stat blood work today. GI cocktail today. EKG today. To er today. Return for physical here to go over blood pressure and cholesterol levels. Return at your convenience fasting a few days prior to appointment to check cholesterol levels.

## 2012-01-27 NOTE — Assessment & Plan Note (Addendum)
3d h/o overall atypical chest pain but does have some concerning characteristics on story as well as significant risk factors including h/o HTN, HLD, smoking and fmhx.   EKG today - concerning for ST changes anterior leads. Gi cocktail today did help some but continued left shoulder and arm pain so will refer to ER for further eval.  Send via ambulance.

## 2012-01-27 NOTE — Telephone Encounter (Signed)
Seen today. 

## 2012-01-27 NOTE — ED Provider Notes (Addendum)
History    56 year old female with chest pain. Gradual onset on Saturday. Has been intermittent since then. Pain is substernal with radiation to her left shoulder. Intermittent. No appreciable exacerbating relieving factors. Pain is pressure-like. Last anywhere from about 20 minutes to an hour. Not associated with diaphoresis, palpitations or shortness of breath. Denies history of known coronary artery disease. Does have a history of hypertension, hyperlipidemia and a long standing smoking history. Never had a stress test. No unusual leg pain or swelling. Never had pain like this before. ASA prior to arrival. SL nitro in route which which completely relieved pain. Currently no complaints. Was referred from her PCP's office because of abnormal EKG.  CSN: [id number]  Arrival date & time 01/27/12  1107   First MD Initiated Contact with Patient 01/27/12 1110      Chief Complaint  Patient presents with  . Chest Pain    (Consider location/radiation/quality/duration/timing/severity/associated sxs/prior treatment) HPI  Past Medical History  Diagnosis Date  . Depression   . Hypertension   . Osteopenia   . NSVD (normal spontaneous vaginal delivery) 1988  . Smoker   . Hypercholesteremia   . Restless leg syndrome     Past Surgical History  Procedure Date  . Gyn surgery 02/1998    total hysterectomy, abn pap smear, no cancer    Family History  Problem Relation Age of Onset  . Cancer Father     prostate  . Heart disease Father 16    MI  . Hypertension Father   . Diabetes Sister     History  Substance Use Topics  . Smoking status: Current Everyday Smoker -- 1.0 packs/day for 38 years    Types: Cigarettes  . Smokeless tobacco: Never Used  . Alcohol Use: No    OB History    Grav Para Term Preterm Abortions TAB SAB Ect Mult Living                  Review of Systems   Review of symptoms negative unless otherwise noted in HPI.   Allergies  Review of patient's allergies  indicates no known allergies.  Home Medications   Current Outpatient Rx  Name Route Sig Dispense Refill  . GI COCKTAIL [place] Oral Take 30 mLs by mouth once. Shake well. 30 mL 0  . CITALOPRAM HYDROBROMIDE 10 MG PO TABS Oral Take 1 tablet (10 mg total) by mouth daily. 30 tablet 6  . CYANOCOBALAMIN 1000 MCG/ML IJ SOLN Intramuscular Inject 1 mL (1,000 mcg total) into the muscle every 30 (thirty) days. Weekly for 1 month starting 10/2011 then monthly for 1 year. 1 mL 0  . DIPHENHYDRAMINE-APAP (SLEEP) 25-500 MG PO TABS Oral Take 1 tablet by mouth at bedtime as needed.      Marland Kitchen LISINOPRIL 10 MG PO TABS Oral Take 1 tablet (10 mg total) by mouth daily. 30 tablet 12    NO FURTHER REFILLS UNTIL SEEN BY PHYSICIAN  . ROPINIROLE HCL 1 MG PO TABS  Take one to two by mouth at bedtime 60 tablet 12  . SIMVASTATIN 80 MG PO TABS Oral Take 40 mg by mouth at bedtime.      BP 126/55  Pulse 73  Temp(Src) 98.6 F (37 C) (Oral)  Resp 16  SpO2 96%  Physical Exam  Nursing note and vitals reviewed. Constitutional: She appears well-developed and well-nourished. No distress.  HENT:  Head: Normocephalic and atraumatic.  Eyes: Conjunctivae are normal. Right eye exhibits no discharge. Left eye exhibits  no discharge.  Neck: Neck supple.  Cardiovascular: Normal rate, regular rhythm and normal heart sounds.  Exam reveals no gallop and no friction rub.   No murmur heard. Pulmonary/Chest: Effort normal and breath sounds normal. No respiratory distress. She exhibits no tenderness.       CP not reproducible. No concerning skin lesions noted.  Abdominal: Soft. She exhibits no distension. There is no tenderness.  Musculoskeletal: She exhibits no edema and no tenderness.       Lower extremities symmetric as compared to each other. No calf tenderness. Negative Homan's. No palpable cords.   Neurological: She is alert.  Skin: Skin is warm and dry. She is not diaphoretic.  Psychiatric: She has a normal mood and affect.  Her behavior is normal. Thought content normal.    ED Course  Procedures (including critical care time)  CRITICAL CARE Performed by: Raeford Razor   Total critical care time: 35 minutes  Critical care time was exclusive of separately billable procedures and treating other patients.  Critical care was necessary to treat or prevent imminent or life-threatening deterioration.  Critical care was time spent personally by me on the following activities: development of treatment plan with patient and/or surrogate as well as nursing, discussions with consultants, evaluation of patient's response to treatment, examination of patient, obtaining history from patient or surrogate, ordering and performing treatments and interventions, ordering and review of laboratory studies, ordering and review of radiographic studies, pulse oximetry and re-evaluation of patient's condition.   Labs Reviewed  TROPONIN I - Abnormal; Notable for the following:    Troponin I 19.87 (*)    All other components within normal limits  CBC - Abnormal; Notable for the following:    Hemoglobin 15.3 (*)    All other components within normal limits  BASIC METABOLIC PANEL   No results found.  EKG:  Rhythm: nsr Rate: 76 Axis: normal Intervals: normal ST segments: biphasic t waves in v2/3 and inversions in I and aVL Comparison: stable from prior EKG earlier today. None prior to this located.    1. NSTEMI (non-ST elevated myocardial infarction)       MDM  56yF female with chest pain. Reviewed EKGs from PCPs office and EMS. Biphasic T waves in V2 and V3 concerning for LAD lesion. T-wave inversions in 1 and aVL. Consistent with EKG obtained in ED. Patient has no diagnosed history of coronary artery disease. She does have several risk factors including hypertension, hypercholesterolemia and smoking history. Will require admit for further eval.  1:02 PM NSTEMI. Discussed with cards. Heparin ordered. ASA  pre-hospital.      Raeford Razor, MD 01/27/12 1306  Raeford Razor, MD 01/27/12 1308

## 2012-01-27 NOTE — Telephone Encounter (Signed)
Caller: Karen Osborne/Patient; PCP: Eustaquio Boyden; CB#: [phone];; Call regarding Heart Burn That Is Shooting To Her Left Shoulder and Arm. Intermittent mid chest pain "then there's a void" a burning and pressure sensation between the breasts, L arm pain that feels heavy and numb x 2 d (01/25/12.) Movement increases the pain. OTC antacid relieved her pain slightly. She has an appt at 0915 today with Dr. Reece Agar. To keep appt per CP Protocol.

## 2012-01-27 NOTE — H&P (Signed)
History and Physical  Patient ID: Karen Osborne MRN: [id number], SOB: February 14, 1956 56 y.o. Date of Encounter: 01/27/2012, 1:44 PM  Primary Physician: Eustaquio Boyden, MD, MD Primary Cardiologist: New  Chief Complaint: Chest Pain  HPI: 56 y.o. female w/ PMHx significant for HTN, HLD, and tobacco abuse who presented to [facility] on 01/27/2012 with complaints of chest pain.  She has no prior cardiac history. Father had MI in his 92s. She reports having new onset substernal chest pressure on Saturday (01/23/12) that has been intermittent until today. It radiates to left shoulder/arm and lasts about 1hr then goes away. She presented to her PCP today where EKG showed ST/T changes concerning for ischemia, was given ASA and sent to ER via ambulance where she received SL NTG with resolution of chest pain. In the ED, EKG revealed NSR 76bpm, lateral TWI, and anteroseptal biphasic T waves. Troponin was elevated at 19.8. She was chest pain free upon evaluation. Heparin drip was started and arrangements were made for urgent cardiac catheterization.   Past Medical History  Diagnosis Date  . Hypertension   . Tobacco abuse   . Hypercholesteremia   . Restless leg syndrome   . NSVD (normal spontaneous vaginal delivery) 1988  . Osteopenia   . Depression      Surgical History:  Past Surgical History  Procedure Date  . Gyn surgery 02/1998    total hysterectomy, abn pap smear, no cancer     Home Meds: Medication Sig  citalopram (CELEXA) 10 MG tablet Take 10 mg by mouth daily.  cyanocobalamin (,VITAMIN B-12,) 1000 MCG/ML injection Inject 1,000 mcg into the muscle every 30 (thirty) days. Next dose at the end of may  diphenhydramine-acetaminophen (TYLENOL PM EXTRA STRENGTH) 25-500 MG TABS Take 2 tablets by mouth at bedtime.   lisinopril (PRINIVIL,ZESTRIL) 10 MG tablet Take 1 tablet (10 mg total) by mouth daily.  rOPINIRole (REQUIP) 1 MG tablet Take 1-2 mg by mouth at bedtime. Pt takes 1 or 2 tabs  depending on how bad the restless leg symptoms are  simvastatin (ZOCOR) 80 MG tablet Take 40 mg by mouth at bedtime.    Allergies: No Known Allergies  Social History  . Marital Status: Married    Number of Children: 1   Occupational History  . 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 Smoking status: Current Everyday Smoker -- 1.0 packs/day for 38 years    Types: Cigarettes  . Smokeless tobacco: Never Used  . Alcohol Use: No  . Drug Use: No   Family History  Problem Relation Age of Onset  . Cancer Father     prostate  . Heart disease Father 55    MI  . Hypertension Father   . Diabetes Sister     Review of Systems: General: negative for chills, fever, night sweats or weight changes.  Cardiovascular: (+) chest pain; negative for shortness of breath, dyspnea on exertion, edema, orthopnea, palpitations, paroxysmal nocturnal dyspnea  Dermatological: negative for rash Respiratory: negative for cough or wheezing Urologic: negative for hematuria Abdominal: negative for nausea, vomiting, diarrhea, bright red blood per rectum, melena, or hematemesis Neurologic: negative for visual changes, syncope, or dizziness All other systems reviewed and are otherwise negative except as noted above.  Labs:   Component Value Date   WBC 10.2 01/27/2012   HGB 15.3* 01/27/2012   HCT 44.0 01/27/2012   MCV 90.3 01/27/2012   PLT 234 01/27/2012  Lab 01/27/12 1150  NA 141  K 4.0  CL 108  CO2 24  BUN 8  CREATININE 0.57  CALCIUM 9.0  GLUCOSE 95   Basename 01/27/12 1150  TROPONINI 19.87*   Radiology/Studies:  CXR - Pending   EKG: NSR, lateral TWIs and anteroseptal biphasic T waves.   Physical Exam: Blood pressure 135/71, pulse 73, temperature 98.6 F (37 C), temperature source Oral, resp. rate 18, weight 189 lb (85.73 kg), SpO2 99.00%. General: Pleasant overweight middle-aged white female in no acute distress. Head: Normocephalic,  atraumatic, sclera non-icteric, nares are without discharge Neck: Supple. Negative for carotid bruits. JVD not elevated. Lungs: Distant breath sounds with rare rhonchi Heart: RRR with S1 S2. No murmurs, rubs, or gallops appreciated. Abdomen: Soft, non-tender, non-distended with normoactive bowel sounds. No rebound/guarding. No obvious abdominal masses. Msk:  Strength and tone appear normal for age. Extremities: No edema. No clubbing or cyanosis. 1+ PT pulses bilaterally Neuro: Alert and oriented X 3. Moves all extremities spontaneously. Psych:  Responds to questions appropriately with a normal affect.    ASSESSMENT AND PLAN:  56 y.o. female w/ PMHx significant for HTN, HLD, and tobacco abuse who presented to [facility] on 01/27/2012 with complaints of chest pain.  1. NSTEMI: She presents with four days of intermittent substernal chest pressure, EKG changes consistent with ischemia and elevated troponin. Multiple cardiac risk factors including htn, hld, tobacco abuse, and fam hx. She is currently chest pain free. She needs urgent evaluation with cardiac cath. Cont IV heparin. Add 81mg  ASA, metoprolol 12.5mg  Q6H, and 1/2 inch nitropaste. Increase statin to 80mg  Lipitor. Cont Lisinopril 10mg  daily. Check A1C and lipid panel for risk stratification. Obtain echocardiogram. Further plans pending cath results.  2. Tobacco Abuse: Counseled on importance of quitting. Will obtain formal tobacco cessation counseling. Nicotine patch if needed.  3. HLD: Lipid panel pending. Statin as above.  4. HTN: Stable. Meds as above.   Signed, HOPE, JESSICA PA-C 01/27/2012, 1:44 PM  Patient seen with PA, agree with the above note.  Patient has had one and off chest pain radiating down her left arm since Saturday.  As the pain has continued, she saw her PCP today who was concerned by the ongoing CP and abnormal ECG so sent her to the ER.  Here, ECG showed lateral TW inversions and anteroseptal biphasic T  waves.  Troponin was 19.8.  She had CP on ER presentation that resolved with 2 sublingual NTGs.  She is currently CP-free. Patient has NSTEMI with stuttering pain over > 4 days.  RFs = HTN, increased cholesterol, active smoking.  - LHC today, will arrange for this afternoon.  - ASA, heparin gtt.  Will hold off on P2Y12 inhibitor as she will be going soon to cath lab.  - Atorvastatin 80, lisinopril 10 daily, metoprolol 12.5 mg q6 hrs.   - Echo - Needs smoking cessation.   Marca Ancona 01/27/2012 2:08 PM

## 2012-01-27 NOTE — CV Procedure (Signed)
Cardiac Catheterization Operative Report  Karen Osborne [id number] 5/15/20133:05 PM Eustaquio Boyden, MD, MD  Procedure Performed:  1. Left Heart Catheterization 2. Selective Coronary Angiography 3. Left ventricular angiogram 4. PTCA/DES x 1 mid LAD  Operator: Verne Carrow, MD  Arterial access site:  Right radial artery.   Indication:  NSTEMI, tobacco abuse, chest pain.                                  Procedure Details: The risks, benefits, complications, treatment options, and expected outcomes were discussed with the patient. The patient and/or family concurred with the proposed plan, giving informed consent. The patient was brought to the cath lab after IV hydration was begun and oral premedication was given. The patient was further sedated with Versed and Fentanyl. The right wrist was assessed with an Allens test which was positive. The right wrist was prepped and draped in a sterile fashion. 1% lidocaine was used for local anesthesia. Using the modified Seldinger access technique, a 6 French sheath was placed in the right radial artery. 3 mg Nicardipine was given through the sheath. 3500 units IV heparin was given. Standard diagnostic catheters were used to perform selective coronary angiography. The patient was found to have a severe stenosis of the mid LAD. A XB LAD 3.5 guiding catheter was used to engage the LAD. The patient was given 60 mg of Effient and was also given a bolus of Angiomax, then a drip was started. When the ACT was greater than 200, I passed a Whisper wire down the LAD. I then used a 2.0 x 12 mm balloon to pre-dilate the stenosis. A 2.25 x 16 mm Promus Element DES was deployed in the mid LAD. I post-dilated the stent with a 2.5 x 12 mm Agency balloon. There was a good step up and step down with no dissection. There was good flow down the vessel. The guide and wire were removed.   A pigtail catheter was used to perform a left ventricular angiogram. The sheath  was removed from the right radial artery and a Terumo hemostasis band was applied at the arteriotomy site on the right wrist.  There were no immediate complications. The patient was taken to the recovery area in stable condition.   Hemodynamic Findings: Central aortic pressure: 108/57 Left ventricular pressure: 112/8/14  Angiographic Findings:  Left main: Short segment without disease.   Left Anterior Descending Artery: Moderate sized vessel that courses to the apex. The proximal vessel has mild 20% plaque disease. The mid LAD has a 99% stenosis. The distal vessel becomes small caliber and has mild plaque disease. There are two small caliber diagonal branches with mild plaque disease.   Circumflex Artery: Moderate sized vessel with an early OM branch. This OM branch has 30% proximal stenosis. The AV groove Circumflex is small in caliber.   Right Coronary Artery: Large, dominant vessel with 30% proximal stenosis, 50% mid stenosis and 30% distal stenosis.   Left Ventricular Angiogram: LVEF 60%. There is hypokinesis of the apex. Mild to moderate MR.   Impression: 1. 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 Successful PTCA/DES x 1 mid LAD 3. Moderate disease in the RCA and Circumflex 4. Preserved LVEF with hypokinesis of the apex.  5. Mild to moderate MR  Recommendations: She will need ASA and Effient for one year. I will start a statin and beta blocker. Will add Ace-inh  if BP tolerates.        Complications:  None. The patient tolerated the procedure well.

## 2012-01-27 NOTE — Progress Notes (Signed)
  Subjective:    Patient ID: Karen Osborne, female    DOB: [birth year], 56 y.o.   MRN: [id number]  HPI CC: burning  2-3 day h/o heaviness and pressure hurting in mid chest, substernal and left sided, pain travels to shoulderblade and down left arm.  When gets bad feels like something coming up into throat.  Discomfort described as muscles tightening up.  If holds onto chest feels better.  Occasional epigastric burning and feeling like food coming back up.  No nausea/vomiting, sob.  Walks for exercise.  Pain does come on when walking.  Pain not relieved by rest.  Currently with some left arm pain but no chest pain.    Denies abd pain, n/v, sob, leg swelling, fevers/chills.  Took antacid pill yesterday, did help.  No h/o GERD.  Smoker 1 ppd, h/o HTN and HLD. Wt Readings from Last 3 Encounters:  01/27/12 189 lb (85.73 kg)  12/02/11 188 lb 12 oz (85.616 kg)  11/03/11 189 lb 12 oz (86.07 kg)   BP Readings from Last 3 Encounters:  01/27/12 124/80  12/02/11 130/84  11/03/11 142/86   Past Medical History  Diagnosis Date  . Depression   . Hypertension   . Osteopenia   . NSVD (normal spontaneous vaginal delivery) 1988  . Smoker     Family History  Problem Relation Age of Onset  . Cancer Father     prostate  . Heart disease Father     MI  . Hypertension Father   . Diabetes Sister     Review of Systems Per HPI    Objective:   Physical Exam  Nursing note and vitals reviewed. Constitutional: She appears well-developed and well-nourished. No distress.  HENT:  Head: Normocephalic and atraumatic.  Mouth/Throat: Oropharynx is clear and moist. No oropharyngeal exudate.  Eyes: Conjunctivae and EOM are normal. Pupils are equal, round, and reactive to light. No scleral icterus.  Neck: Normal range of motion. Neck supple.  Cardiovascular: Normal rate, regular rhythm, normal heart sounds and intact distal pulses.   No murmur heard. Pulmonary/Chest: Effort normal and breath sounds  normal. No respiratory distress. She has no wheezes. She has no rales. She exhibits tenderness.       Tender to palpation left 2nd costochondral junction  Abdominal: Soft. Bowel sounds are normal. She exhibits no distension and no mass. There is tenderness (mild epigastric). There is no rebound and no guarding.  Musculoskeletal: She exhibits no edema.  Lymphadenopathy:    She has no cervical adenopathy.  Skin: Skin is warm and dry. No rash noted.  Psychiatric: She has a normal mood and affect.       Assessment & Plan:

## 2012-01-28 ENCOUNTER — Other Ambulatory Visit: Payer: Self-pay

## 2012-01-28 DIAGNOSIS — I517 Cardiomegaly: Secondary | ICD-10-CM

## 2012-01-28 DIAGNOSIS — I2510 Atherosclerotic heart disease of native coronary artery without angina pectoris: Secondary | ICD-10-CM

## 2012-01-28 LAB — COMPREHENSIVE METABOLIC PANEL
ALT: 15 U/L (ref 0–35)
AST: 27 U/L (ref 0–37)
Albumin: 3.3 g/dL — ABNORMAL LOW (ref 3.5–5.2)
Alkaline Phosphatase: 97 U/L (ref 39–117)
BUN: 7 mg/dL (ref 6–23)
CO2: 25 mEq/L (ref 19–32)
Calcium: 8.9 mg/dL (ref 8.4–10.5)
Chloride: 109 mEq/L (ref 96–112)
Creatinine, Ser: 0.67 mg/dL (ref 0.50–1.10)
GFR calc Af Amer: >90 mL/min (ref >90)
GFR calc non Af Amer: >90 mL/min (ref >90)
Glucose, Bld: 113 mg/dL — ABNORMAL HIGH (ref 70–99)
Potassium: 4 mEq/L (ref 3.5–5.1)
Sodium: 140 mEq/L (ref 135–145)
Total Bilirubin: 0.6 mg/dL (ref 0.3–1.2)
Total Protein: 6.2 g/dL (ref 6.0–8.3)

## 2012-01-28 LAB — LIPID PANEL
Cholesterol: 146 mg/dL (ref 0–200)
HDL: 31 mg/dL — ABNORMAL LOW (ref >39)
LDL Cholesterol: 87 mg/dL (ref 0–99)
Total CHOL/HDL Ratio: 4.7 RATIO
Triglycerides: 138 mg/dL (ref <150)
VLDL: 28 mg/dL (ref 0–40)

## 2012-01-28 LAB — HEMOGLOBIN A1C
Hgb A1c MFr Bld: 6.1 % — ABNORMAL HIGH (ref <5.7)
Mean Plasma Glucose: 128 mg/dL — ABNORMAL HIGH (ref <117)

## 2012-01-28 LAB — CBC
HCT: 41.6 % (ref 36.0–46.0)
Hemoglobin: 14.5 g/dL (ref 12.0–15.0)
MCH: 31.5 pg (ref 26.0–34.0)
MCHC: 34.9 g/dL (ref 30.0–36.0)
MCV: 90.2 fL (ref 78.0–100.0)
Platelets: 206 10*3/uL (ref 150–400)
RBC: 4.61 MIL/uL (ref 3.87–5.11)
RDW: 13.1 % (ref 11.5–15.5)
WBC: 9.7 10*3/uL (ref 4.0–10.5)

## 2012-01-28 LAB — T4, FREE: Free T4: 1.1 ng/dL (ref 0.80–1.80)

## 2012-01-28 LAB — CARDIAC PANEL(CRET KIN+CKTOT+MB+TROPI)
CK, MB: 18.3 ng/mL (ref 0.3–4.0)
Relative Index: 8.2 — ABNORMAL HIGH (ref 0.0–2.5)
Total CK: 222 U/L — ABNORMAL HIGH (ref 7–177)
Troponin I: 6.1 ng/mL (ref <0.30)

## 2012-01-28 LAB — TSH: TSH: 0.61 u[IU]/mL (ref 0.350–4.500)

## 2012-01-28 MED ORDER — CARVEDILOL 6.25 MG PO TABS
6.2500 mg | ORAL_TABLET | Freq: Two times a day (BID) | ORAL | Status: DC
  Administered 2012-01-28 – 2012-01-29 (×2): 6.25 mg via ORAL
  Filled 2012-01-28 (×4): qty 1

## 2012-01-28 MED ORDER — METOPROLOL TARTRATE 50 MG PO TABS
50.0000 mg | ORAL_TABLET | Freq: Two times a day (BID) | ORAL | Status: DC
  Filled 2012-01-28 (×2): qty 1

## 2012-01-28 MED FILL — Dextrose Inj 5%: INTRAVENOUS | Qty: 50 | Status: AC

## 2012-01-28 MED FILL — Nicardipine HCl IV Soln 2.5 MG/ML: INTRAVENOUS | Qty: 1 | Status: AC

## 2012-01-28 NOTE — Progress Notes (Signed)
Patient Name: Karen Osborne Date of Encounter: 01/28/2012   Principal Problem:  *NSTEMI (non-ST elevated myocardial infarction) Active Problems:  CAD (coronary artery disease)  HYPERCHOLESTEROLEMIA  TOBACCO ABUSE  HYPERTENSION    SUBJECTIVE  No chest pain, sob.  Has ambulated much yet.  Says she guesses that she has quit smoking.  CURRENT MEDS    . aspirin EC  81 mg Oral Daily  . atorvastatin  80 mg Oral q1800  . bivalirudin      . citalopram  10 mg Oral Daily  . diazepam  5 mg Oral On Call  . fentaNYL      . heparin      . heparin      . heparin  4,000 Units Intravenous Once  . lidocaine      . lisinopril  10 mg Oral Daily  . metoprolol tartrate  12.5 mg Oral Q6H  . midazolam      . nitroGLYCERIN  0.5 inch Topical Q6H  . nitroGLYCERIN      . pneumococcal 23 valent vaccine  0.5 mL Intramuscular Once  . prasugrel      . prasugrel  10 mg Oral Daily  . sodium chloride  3 mL Intravenous Q12H  . DISCONTD: aspirin  324 mg Oral Pre-Cath  . DISCONTD: heparin  1,000 Units/hr Intravenous Once    OBJECTIVE  Filed Vitals:   01/27/12 1715 01/27/12 2039 01/28/12 0200 01/28/12 0539  BP: 142/65 139/69 130/62 140/61  Pulse: 74 86 69 69  Temp:  98.1 F (36.7 C) 98.3 F (36.8 C) 98.2 F (36.8 C)  TempSrc:  Oral Oral Oral  Resp:  18 16 18   Weight:      SpO2: 98% 95% 96% 97%    Intake/Output Summary (Last 24 hours) at 01/28/12 0646 Last data filed at 01/27/12 2130  Gross per 24 hour  Intake 1625.5 ml  Output      0 ml  Net 1625.5 ml   Filed Weights   01/27/12 1300  Weight: 189 lb (85.73 kg)    PHYSICAL EXAM  General: Pleasant, NAD. Neuro: Alert and oriented X 3. Moves all extremities spontaneously. Psych: Normal affect. HEENT:  Normal  Neck: Supple without bruits or JVD. Lungs:  Resp regular and unlabored, CTA. Heart: RRR no s3, s4, or murmurs. Abdomen: Soft, non-tender, non-distended, BS + x 4.  Extremities: No clubbing, cyanosis or edema. DP/PT/Radials  2+ and equal bilaterally.  R wrist cath site w/o bleeding, bruit, hematoma.  Accessory Clinical Findings  CBC  Basename 01/28/12 0146 01/27/12 1150  WBC 9.7 10.2  NEUTROABS -- --  HGB 14.5 15.3*  HCT 41.6 44.0  MCV 90.2 90.3  PLT 206 234   Basic Metabolic Panel  Basename 01/28/12 0146 01/27/12 1150  NA 140 141  K 4.0 4.0  CL 109 108  CO2 25 24  GLUCOSE 113* 95  BUN 7 8  CREATININE 0.67 0.57  CALCIUM 8.9 9.0  MG -- --  PHOS -- --   Liver Function Tests  [facility] 01/28/12 0146  AST 27  ALT 15  ALKPHOS 97  BILITOT 0.6  PROT 6.2  ALBUMIN 3.3*    Cardiac Enzymes  Basename 01/28/12 0146 01/27/12 1926 01/27/12 1600  CKTOTAL 222* 319* 368*  CKMB 18.3* 27.9* 36.4*  CKMBINDEX -- -- --  TROPONINI 6.10* 11.92* 24.31*   Hemoglobin A1C  Basename 01/27/12 1559  HGBA1C 6.1*   Fasting Lipid Panel  Basename 01/28/12 0146  CHOL 146  HDL  31*  LDLCALC 87  TRIG 138  CHOLHDL 4.7  LDLDIRECT --   Thyroid Function Tests  Basename 01/27/12 1559  TSH 0.333*  T4TOTAL --  T3FREE --  THYROIDAB --    TELE  RSR  ECG  Sinus, TWI I, aVL.  No acute changes.  Radiology/Studies  Dg Chest 2 View  01/27/2012  *RADIOLOGY REPORT*  Clinical Data: Post cardiac catheterization.  Unable to elevate arm.  CHEST - 2 VIEW  IMPRESSION: Bibasilar atelectasis.  Original Report Authenticated By: Reyes Ivan, M.D.   ASSESSMENT AND PLAN  1.  NSTEMI/CAD:  S/p PCI/DES --> LAD.  No recurrent chest pain/dyspnea.  Wrist looks good.  Cont asa, effient (@ least 1  Yr), statin, bb (titrate and consolidate ->50 bid), and acei.  Cardiac rehab to see this AM.  Ambulate.   2.  HTN:  BP's slightly elevated.  Titrate bb.  3.  HL:  LDL-87.  Cont high dose statin.  4.  Tob Abuse:  Cessation advised.  She says she will quit.  Will order more formal counseling this AM.  5.  ? Hyperthyroidism:  TSH 0.333.  Repeat with fT4.  Signed, Nicolasa Ducking NP  Patient seen with NP, agree with  above note.  Patient doing well this am with not chest pain.  Echo being done: overall EF appears preserved with apical hypokinesis.  Will change metoprolol to carvedilol.  She had a large NSTEMI with TnI 24 and some wall motion abnormality, so will watch today and discharge in the am. Will need cardiac rehab.   Marca Ancona 01/28/2012 10:04 AM

## 2012-01-28 NOTE — Consult Note (Signed)
Pt is smoking 1 1/2 ppd. She is in action stage and eager to quit. States she wants to quit on patches. Recommended to start with 35 mg patch. Discussed patch use instructions and how to taper. Pt voices understanding. Referred to 1-800 quit now for f/u and support. Discussed oral fixation substitutes, second hand smoke and in home smoking policy. Reviewed and gave pt Written education/contact information.

## 2012-01-28 NOTE — Progress Notes (Signed)
CARDIAC REHAB PHASE I   PRE:  Rate/Rhythm: 77 SR    BP: sitting 145/63    SaO2: 97 RA  MODE:  Ambulation: 440 ft   POST:  Rate/Rhythm: 89 SR    BP: sitting 148/61     SaO2:   Tolerated well. No c/o except general fatigue. Ed completed and pt requests her name be sent to [facility]. [phone]  Harriet Masson CES, ACSM

## 2012-01-28 NOTE — Progress Notes (Signed)
  Echocardiogram 2D Echocardiogram has been performed.  Cathie Beams Deneen 01/28/2012, 10:13 AM

## 2012-01-29 ENCOUNTER — Other Ambulatory Visit: Payer: Self-pay

## 2012-01-29 ENCOUNTER — Encounter (HOSPITAL_COMMUNITY): Payer: Self-pay | Admitting: Physician Assistant

## 2012-01-29 LAB — PROTIME-INR
INR: 1.03 (ref 0.00–1.49)
Prothrombin Time: 13.7 seconds (ref 11.6–15.2)

## 2012-01-29 MED ORDER — NITROGLYCERIN 0.4 MG SL SUBL: 0.4000 mg | SUBLINGUAL_TABLET | SUBLINGUAL | Status: DC | PRN

## 2012-01-29 MED ORDER — ASPIRIN 81 MG PO TBEC: 81.0000 mg | DELAYED_RELEASE_TABLET | Freq: Every day | ORAL | Status: AC

## 2012-01-29 MED ORDER — NICOTINE 21-14-7 MG/24HR TD KIT: PACK | TRANSDERMAL | Status: DC

## 2012-01-29 MED ORDER — PRASUGREL HCL 10 MG PO TABS: 10.0000 mg | ORAL_TABLET | Freq: Every day | ORAL | Status: DC

## 2012-01-29 MED ORDER — CARVEDILOL 6.25 MG PO TABS: 6.2500 mg | ORAL_TABLET | Freq: Two times a day (BID) | ORAL | Status: DC

## 2012-01-29 MED ORDER — ATORVASTATIN CALCIUM 80 MG PO TABS: 80.0000 mg | ORAL_TABLET | Freq: Every day | ORAL | Status: DC

## 2012-01-29 NOTE — Progress Notes (Signed)
Patient ID: DOVEY FATZINGER, female   DOB: [birth year], 56 y.o.   MRN: [id number]   Patient Name: Karen Osborne Date of Encounter: 01/29/2012   Principal Problem:  *NSTEMI (non-ST elevated myocardial infarction) Active Problems:  HYPERCHOLESTEROLEMIA  TOBACCO ABUSE  HYPERTENSION  CAD (coronary artery disease)    SUBJECTIVE  No chest pain, sob.  No complaints.  CURRENT MEDS    . aspirin EC  81 mg Oral Daily  . atorvastatin  80 mg Oral q1800  . carvedilol  6.25 mg Oral BID WC  . citalopram  10 mg Oral Daily  . diazepam  5 mg Oral On Call  . lisinopril  10 mg Oral Daily  . nitroGLYCERIN  0.5 inch Topical Q6H  . prasugrel  10 mg Oral Daily  . sodium chloride  3 mL Intravenous Q12H  . DISCONTD: metoprolol tartrate  50 mg Oral BID    OBJECTIVE  Filed Vitals:   01/28/12 1206 01/28/12 2039 01/29/12 0016 01/29/12 0608  BP: 142/65 131/55 138/56 117/59  Pulse: 65 67 69 69  Temp: 98 F (36.7 C) 98.2 F (36.8 C) 98.2 F (36.8 C) 98.2 F (36.8 C)  TempSrc: Oral Oral Oral Oral  Resp: 18 20 17 20   Height:      Weight:    180 lb 12.4 oz (82 kg)  SpO2: 97% 95% 95% 95%    Intake/Output Summary (Last 24 hours) at 01/29/12 0740 Last data filed at 01/28/12 2300  Gross per 24 hour  Intake    900 ml  Output      0 ml  Net    900 ml   Filed Weights   01/27/12 1300 01/29/12 0608  Weight: 189 lb (85.73 kg) 180 lb 12.4 oz (82 kg)    PHYSICAL EXAM  General: Pleasant, NAD. Neuro: Alert and oriented X 3. Moves all extremities spontaneously. Psych: Normal affect. HEENT:  Normal  Neck: Supple without bruits or JVD. Lungs:  Resp regular and unlabored, CTA. Heart: RRR no s3, s4, or murmurs. Abdomen: Soft, non-tender, non-distended, BS + x 4.  Extremities: No clubbing, cyanosis or edema. DP/PT/Radials 2+ and equal bilaterally.  R wrist cath site w/o bleeding, bruit, hematoma.  Accessory Clinical Findings  CBC  Basename 01/28/12 0146 01/27/12 1150  WBC 9.7 10.2  NEUTROABS --  --  HGB 14.5 15.3*  HCT 41.6 44.0  MCV 90.2 90.3  PLT 206 234   Basic Metabolic Panel  Basename 01/28/12 0146 01/27/12 1150  NA 140 141  K 4.0 4.0  CL 109 108  CO2 25 24  GLUCOSE 113* 95  BUN 7 8  CREATININE 0.67 0.57  CALCIUM 8.9 9.0  MG -- --  PHOS -- --   Liver Function Tests  [facility] 01/28/12 0146  AST 27  ALT 15  ALKPHOS 97  BILITOT 0.6  PROT 6.2  ALBUMIN 3.3*    Cardiac Enzymes  Basename 01/28/12 0146 01/27/12 1926 01/27/12 1600  CKTOTAL 222* 319* 368*  CKMB 18.3* 27.9* 36.4*  CKMBINDEX -- -- --  TROPONINI 6.10* 11.92* 24.31*   Hemoglobin A1C  Basename 01/27/12 1559  HGBA1C 6.1*   Fasting Lipid Panel  Basename 01/28/12 0146  CHOL 146  HDL 31*  LDLCALC 87  TRIG 409  CHOLHDL 4.7  LDLDIRECT --   Thyroid Function Tests  Basename 01/28/12 0925  TSH 0.610  T4TOTAL --  T3FREE --  THYROIDAB --    TELE  RSR  ECG  Sinus, TWI I, aVL.  No  acute changes.  Radiology/Studies  Dg Chest 2 View  01/27/2012  *RADIOLOGY REPORT*  Clinical Data: Post cardiac catheterization.  Unable to elevate arm.  CHEST - 2 VIEW  IMPRESSION: Bibasilar atelectasis.  Original Report Authenticated By: Reyes Ivan, M.D.   ASSESSMENT AND PLAN  1.  NSTEMI/CAD:  S/p PCI/DES --> LAD.  No recurrent chest pain/dyspnea.   Cont asa, effient (@ least 1  Yr), statin, Coreg, and lisinopril.  To do outpatient rehab at [facility].  Echo yesterday with apical hypokinesis but overall EF preserved.   2.  HTN:  BP controlled.   3.  HL:  LDL-87.  Cont high dose statin.  4.  Tob Abuse:  Cessation advised.  She says she will quit.  Needs to use nicotine patches as outpatient.  5.  ? Hyperthyroidism:  Repeat TSH and free T4 were normal.  6. Disposition: Home today.  Followup Dr. Shirlee Latch 2 wks.  Cardiac meds: Coreg 6.25 bid, lisinopril 10 daily, Effient 10 daily, ASA 81, Atorvastatin 80, nicotine patches.  Marca Ancona 01/29/2012 7:42 AM

## 2012-01-29 NOTE — Discharge Summary (Signed)
Discharge Summary   Patient ID: Karen Osborne MRN: [id number], DOB/AGE: 1956-07-01 56 y.o. Admit date: 01/27/2012 D/C date:     01/29/2012   Primary Discharge Diagnoses:  1. Newly diagnosed CAD/NSTEMI this admission s/p PTCA/DES to mid LAD 01/27/12 - preserved EF 55% but apical hypokinesis present 2. HTN 3. Hyperlipidemia - will need f/u LFTs/lipids in 6 weeks 4. Tobacco abuse 5. Hyperglycemia (A1C 6.1)  Secondary Discharge Diagnoses:  1. Restless leg syndrome 2. Osteopenia 3. Depression  Hospital Course: 56 y/o F with h/o HTN, HLD, tobacco abuse but no prior cardiac history presented to [facility] with complaints of CP, intermittent since 01/23/12 described as substernal chest pressure. It radiated to L shoulder and arm. She presented to her PCP today where EKG showed ST/T changes concerning for ischemia and was given ASA and sent to ER via ambulance where she received SL NTG with resolution of pain. In the ED, EKG revealed NSR 76bpm, lateral TWI, and anteroseptal biphasic T waves. Initial troponin was elevated at 19.8. Heparin drip was started and arrangements were made for urgent cardiac catheterization. ASA, metoprolol were added. Lisinopril was continued and statin was increased. Cardiac cath demonstrated: Left main: Short segment without disease.  Left Anterior Descending Artery: Moderate sized vessel that courses to the apex. The proximal vessel has mild 20% plaque disease. The mid LAD has a 99% stenosis. The distal vessel becomes small caliber and has mild plaque disease. There are two small caliber diagonal branches with mild plaque disease.  Circumflex Artery: Moderate sized vessel with an early OM branch. This OM branch has 30% proximal stenosis. The AV groove Circumflex is small in caliber.  Right Coronary Artery: Large, dominant vessel with 30% proximal stenosis, 50% mid stenosis and 30% distal stenosis.  Left Ventricular Angiogram: LVEF 60%. There is hypokinesis of the  apex. Mild to moderate MR.  Impression:  1. Single vessel CAD with severe stenosis in mid LAD  2. Successful PTCA/DES x 1 mid LAD  3. Moderate disease in the RCA and Circumflex  4. Preserved LVEF with hypokinesis of the apex.  5. Mild to moderate MR She underwent successful PTCA/DES x 1 mid LAD and was started on Effient. Echo was also done showing preserved EF but severe hypokinesis of the apical septal wall and the true apex. Her metoprolol was thus changed to Coreg. TSH was also checked on admission and was low at 0.333 but repeat TSH, fT4 was normal. A1C was 6.1. Today she is doing better and has ambulated with cardiac rehab. The patient was seen and examined today and felt stable for discharge by Dr. Shirlee Latch. The plan is for her to be seen within 2 weeks (the best available appt will be with Ward Givens on 02/12/12).   Discharge Vitals: Blood pressure 143/67, pulse 67, temperature 98.3 F (36.8 C), temperature source Oral, resp. rate 18, height 5\' 7"  (1.702 m), weight 180 lb 12.4 oz (82 kg), SpO2 96.00%.  Labs: Lab Results  Component Value Date   WBC 9.7 01/28/2012   HGB 14.5 01/28/2012   HCT 41.6 01/28/2012   MCV 90.2 01/28/2012   PLT 206 01/28/2012     Lab 01/28/12 0146  NA 140  K 4.0  CL 109  CO2 25  BUN 7  CREATININE 0.67  CALCIUM 8.9  PROT 6.2  BILITOT 0.6  ALKPHOS 97  ALT 15  AST 27  GLUCOSE 113*    Basename 01/28/12 0146 01/27/12 1926 01/27/12 1600 01/27/12 1150  CKTOTAL  222* 319* 368* --  CKMB 18.3* 27.9* 36.4* --  TROPONINI 6.10* 11.92* 24.31* 19.87*   Lab Results  Component Value Date   CHOL 146 01/28/2012   HDL 31* 01/28/2012   LDLCALC 87 01/28/2012   TRIG 138 01/28/2012   Diagnostic Studies/Procedures   1. Chest 2 View 01/27/2012  *RADIOLOGY REPORT*  Clinical Data: Post cardiac catheterization.  Unable to elevate arm.  CHEST - 2 VIEW  Comparison: None.  Findings: Trachea is midline.  Heart size normal.  Biapical pleural thickening.  Linear densities are seen  at the lung bases.  No pleural fluid.  IMPRESSION: Bibasilar atelectasis.  Original Report Authenticated By: Reyes Ivan, M.D.   2. 2D Echo 01/28/12 Study Conclusions - Left ventricle: The cavity size was normal. Wall thickness was increased in a pattern of mild LVH. The estimated ejection fraction was 55%. Severe hypokinesis of the apical septum and the true apex. Features are consistent with a pseudonormal left ventricular filling pattern, with concomitant abnormal relaxation and increased filling pressure (grade 2 diastolic dysfunction). - Aortic valve: There was no stenosis. - Mitral valve: Trivial regurgitation. - Left atrium: The atrium was mildly dilated. - Right ventricle: The cavity size was normal. Systolic function was normal. - Pulmonary arteries: No complete TR doppler jet so unable to estimate PA systolic pressure. - Inferior vena cava: The vessel was normal in size; the respirophasic diameter changes were in the normal range (= 50%); findings are consistent with normal central venous pressure. Impressions: - Normal LV size with mild LV hypertrophy. EF 55%. Severe hypokinesis of the apical septal wall and the true apex. Normal RV size and systolic function.   Discharge Medications   Medication List  As of 01/29/2012  8:56 AM   STOP taking these medications         simvastatin 80 MG tablet         TAKE these medications         aspirin 81 MG EC tablet   Take 1 tablet (81 mg total) by mouth daily.      atorvastatin 80 MG tablet   Commonly known as: LIPITOR   Take 1 tablet (80 mg total) by mouth at bedtime.      carvedilol 6.25 MG tablet   Commonly known as: COREG   Take 1 tablet (6.25 mg total) by mouth 2 (two) times daily with a meal.      citalopram 10 MG tablet   Commonly known as: CELEXA   Take 10 mg by mouth daily.      cyanocobalamin 1000 MCG/ML injection   Commonly known as: (VITAMIN B-12)   Inject 1,000 mcg into the muscle every 30 (thirty) days. Next  dose at the end of may      lisinopril 10 MG tablet   Commonly known as: PRINIVIL,ZESTRIL   Take 1 tablet (10 mg total) by mouth daily.      Nicotine 21-14-7 MG/24HR Kit   Use 21mg  patch transdermally one daily for 6 weeks (dispense #42), then 14mg  patch one daily for 2 weeks (dispense #14), then 7mg  patch one daily for 2 weeks (dispense #14). Remove old patch before applying new one.      nitroGLYCERIN 0.4 MG SL tablet   Commonly known as: NITROSTAT   Place 1 tablet (0.4 mg total) under the tongue every 5 (five) minutes as needed for chest pain (up to 3 doses).      prasugrel 10 MG Tabs   Commonly known as:  EFFIENT   Take 1 tablet (10 mg total) by mouth daily.      rOPINIRole 1 MG tablet   Commonly known as: REQUIP   Take 1-2 mg by mouth at bedtime. Pt takes 1 or 2 tabs depending on how bad the restless leg symptoms are      TYLENOL PM EXTRA STRENGTH 25-500 MG Tabs   Generic drug: diphenhydramine-acetaminophen   Take 2 tablets by mouth at bedtime.            Disposition   The patient will be discharged in stable condition to home. Discharge Orders    Future Appointments: Provider: Department: Dept Phone: Center:   02/12/2012 12:00 PM Ok Anis, NP [facility] [phone] [facility]     Future Orders Please Complete By Expires   Amb Referral to Cardiac Rehabilitation      Comments:   Referring to [facility]   Diet - low sodium heart healthy      Increase activity slowly      Comments:   No driving for 1 week. No lifting over 10 lbs for 2 weeks. No sexual activity for 2 weeks. 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 Dr. Shirlee Latch does not want you to do any traveling for at least 2 weeks. Keep procedure site clean & dry. If you notice increased pain, swelling, bleeding or pus, call/return!  You may shower, but no soaking baths/hot tubs/pools for 1 week.       Follow-up  Information    Follow up with Nicolasa Ducking, NP. (You will see Ward Givens, NP at Dr. Alford Highland office for your first follow-up appointment 02/12/12 at 12pm)    Contact information:   [address] [place] [address] [phone]       Follow up with Primary Care Doctor. (Your blood sugar was mildly elevated this admission. Please be sure to follow up with your primary doctor on a regular basis to monitor for diabetes.)            Duration of Discharge Encounter: Greater than 30 minutes including physician and PA time.  Signed, Ronie Spies PA-C 01/29/2012, 8:56 AM

## 2012-01-29 NOTE — Progress Notes (Signed)
CARDIAC REHAB PHASE I   PRE:  Rate/Rhythm: 67 SR  BP:  Supine:   Sitting: 143/67  Standing:    SaO2:   MODE:  Ambulation: 1020 ft   POST:  Rate/Rhythem: 87 Sr  BP:  Supine:   Sitting: 137/66  Standing   SaO2:  0840-0900 Tolerated ambulation well  without c/o of cp or SOB. VS stable. Back to side of bed after walk with call  Light in reach. Pt denies any questions related to education provided yesterday.  Beatrix Fetters

## 2012-02-01 ENCOUNTER — Other Ambulatory Visit: Payer: Self-pay | Admitting: Family Medicine

## 2012-02-04 ENCOUNTER — Telehealth: Payer: Self-pay | Admitting: Cardiology

## 2012-02-04 NOTE — Telephone Encounter (Signed)
Asked her to call pcp. Pt agreed to plan.

## 2012-02-04 NOTE — Telephone Encounter (Signed)
Pt calling to see if being emotional is normal, seems to cry at the drop off a hat, pls advise

## 2012-02-11 ENCOUNTER — Encounter: Payer: Self-pay | Admitting: Physician Assistant

## 2012-02-11 ENCOUNTER — Ambulatory Visit (INDEPENDENT_AMBULATORY_CARE_PROVIDER_SITE_OTHER): Payer: 59 | Admitting: Physician Assistant

## 2012-02-11 VITALS — BP 140/72 | HR 60 | Ht 67.0 in | Wt 185.0 lb

## 2012-02-11 DIAGNOSIS — I2510 Atherosclerotic heart disease of native coronary artery without angina pectoris: Secondary | ICD-10-CM

## 2012-02-11 DIAGNOSIS — E785 Hyperlipidemia, unspecified: Secondary | ICD-10-CM

## 2012-02-11 DIAGNOSIS — F17200 Nicotine dependence, unspecified, uncomplicated: Secondary | ICD-10-CM

## 2012-02-11 DIAGNOSIS — I10 Essential (primary) hypertension: Secondary | ICD-10-CM

## 2012-02-11 MED ORDER — LISINOPRIL 20 MG PO TABS: 20.0000 mg | ORAL_TABLET | Freq: Every day | ORAL | Status: DC

## 2012-02-11 NOTE — Patient Instructions (Signed)
PLEASE FOLLOW UP WITH DR. Shirlee Latch IN 6-8 WEEKS   Your physician recommends that you return for lab work in: BMET IN 1 WEEK 02/18/12 @ YOUR PRIMARY CARE PHYSICIAN OFFICE WITH RESULTS TO BE FAXED TO [facility], [facility]  Your physician recommends that you return for lab work in: FASTING LIPID AND LIVER PANEL TO BE DONE @ YOUR PRIMARY CARE PHYSICIANS OFFICE ON 03/25/12.  INCREASE LISINOPRIL TO 20 MG DAILY

## 2012-02-11 NOTE — Progress Notes (Signed)
[address] [place], [place]  [address] Phone: [phone] Fax:  [phone]  Date:  02/11/2012   Name:  Karen Osborne   DOB:  February 16, 1956   MRN:  [id number]  PCP:  Karen Boyden, MD, MD  Primary Cardiologist:  Dr. Marca Osborne  Primary Electrophysiologist:  None    History of Present Illness: Karen Osborne is a 56 y.o. female who returns for post hospital follow up.  She has a history of hypertension, hyperlipidemia and tobacco abuse.  She was admitted 5/15-5/17 with a NSTEMI.  LHC 01/27/12: Proximal LAD 20%, mid LAD 99%, proximal RCA 30%, mid RCA 50%, distal RCA 30%, EF 60%, apical HK, mild to moderate MR.  PCI: Promus DES to the mid LAD.  Echocardiogram 01/28/12: Mild LVH, EF 55%, severe HK of the apical septum and true apex, grade 2 diastolic dysfunction, trivial MR, mild LAE.  Of note, hemoglobin A1c noted to be 6.1.  Today, she is doing well.  She denies any chest pain, shortness of breath, syncope, near syncope, orthopnea, PND or edema.  She is feeling much better.  She is trying to quit smoking.  She is down to 7 or 8 cigarettes a day now.  She has not started using her nicotine patch.  She has been contacted by cardiac rehabilitation.   Wt Readings from Last 3 Encounters:  01/29/12 180 lb 12.4 oz (82 kg)  01/27/12 189 lb (85.73 kg)  01/29/12 180 lb 12.4 oz (82 kg)     Potassium  Date/Time Value Range Status  01/28/2012  1:46 AM 4.0  3.5-5.1 (mEq/L) Final     Creatinine, Ser  Date/Time Value Range Status  01/28/2012  1:46 AM 0.67  0.50-1.10 (mg/dL) Final     ALT  Date/Time Value Range Status  01/28/2012  1:46 AM 15  0-35 (U/L) Final     TSH  Date/Time Value Range Status  01/28/2012  9:25 AM 0.610  0.350-4.500 (uIU/mL) Final     Hemoglobin  Date/Time Value Range Status  01/28/2012  1:46 AM 14.5  12.0-15.0 (g/dL) Final    Past Medical History  Diagnosis Date  . Hypertension   . Tobacco abuse   . Hypercholesteremia   . Restless  leg syndrome   . NSVD (normal spontaneous vaginal delivery) 1988  . Osteopenia   . Depression   . Coronary artery disease     s/p NSTEMI - LHC 01/27/12: Proximal LAD 20%, mid LAD 99%, proximal RCA 30%, mid RCA 50%, distal RCA 30%, EF 60%, apical HK, mild to moderate MR.  PCI: Promus DES to the mid LAD.  Echocardiogram 01/28/12: Mild LVH, EF 55%, severe HK of the apical septum and true apex, grade 2 diastolic dysfunction, trivial MR, mild LAE  . Glucose intolerance (impaired glucose tolerance)     A1C 6.1 01/2012    Current Outpatient Prescriptions  Medication Sig Dispense Refill  . aspirin EC 81 MG EC tablet Take 1 tablet (81 mg total) by mouth daily.      Marland Kitchen atorvastatin (LIPITOR) 80 MG tablet Take 1 tablet (80 mg total) by mouth at bedtime.  30 tablet  6  . carvedilol (COREG) 6.25 MG tablet Take 1 tablet (6.25 mg total) by mouth 2 (two) times daily with a meal.  60 tablet  6  . citalopram (CELEXA) 10 MG tablet Take 10 mg by mouth daily.      . cyanocobalamin (,VITAMIN B-12,) 1000 MCG/ML injection Inject 1,000 mcg into the muscle  every 30 (thirty) days. Next dose at the end of may  1 mL  0  . diphenhydramine-acetaminophen (TYLENOL PM EXTRA STRENGTH) 25-500 MG TABS Take 2 tablets by mouth at bedtime.       Marland Kitchen lisinopril (PRINIVIL,ZESTRIL) 10 MG tablet TAKE 1 TABLET BY MOUTH ONCE A DAY  30 tablet  6  . Nicotine 21-14-7 MG/24HR KIT Use 21mg  patch transdermally one daily for 6 weeks (dispense #42), then 14mg  patch one daily for 2 weeks (dispense #14), then 7mg  patch one daily for 2 weeks (dispense #14). Remove old patch before applying new one.      . nitroGLYCERIN (NITROSTAT) 0.4 MG SL tablet Place 1 tablet (0.4 mg total) under the tongue every 5 (five) minutes as needed for chest pain (up to 3 doses).  25 tablet  4  . prasugrel (EFFIENT) 10 MG TABS Take 1 tablet (10 mg total) by mouth daily.  30 tablet  11  . rOPINIRole (REQUIP) 1 MG tablet Take 1-2 mg by mouth at bedtime. Pt takes 1 or 2 tabs  depending on how bad the restless leg symptoms are      . DISCONTD: citalopram (CELEXA) 10 MG tablet Take 1 tablet (10 mg total) by mouth daily.  30 tablet  6    Allergies: No Known Allergies  History  Substance Use Topics  . Smoking status: Current Everyday Smoker -- 1.0 packs/day for 38 years    Types: Cigarettes  . Smokeless tobacco: Never Used  . Alcohol Use: No     ROS:  Please see the history of present illness.    All other systems reviewed and negative.   PHYSICAL EXAM: VS:  BP 140/72  Pulse 60  Ht 5\' 7"  (1.702 m)  Wt 185 lb (83.915 kg)  BMI 28.97 kg/m2 Well nourished, well developed, in no acute distress HEENT: normal Neck: no JVD Vascular: No carotid bruits Cardiac:  normal S1, S2; RRR; no murmur Lungs:  Decreased breath sounds bilaterally, no wheezing, rhonchi or rales Abd: soft, nontender, no hepatomegaly Ext: no edema; R wrist without hematoma or bruit Skin: warm and dry Neuro:  CNs 2-12 intact, no focal abnormalities noted  EKG:  Sinus rhythm, heart rate 59, normal axis, anterolateral T wave inversions, evolutionary changes   ASSESSMENT AND PLAN:  1.  Coronary Artery Disease Doing well post MI. We discussed the importance of dual antiplatelet therapy.  She will start cardiac rehab soon. Follow up with Dr. Marca Osborne in 6-8 weeks.  2.  Hypertension Uncontrolled. Increase Lisinopril to 20 mg QD. No change on coreg with bradycardia. Check BMET in one week.  3.  Hyperlipidemia Lipitor is new. Check FLP and LFTs in 6-8 weeks.  4.  Glucose Intolerance Follow up with PCP.  5.  Tobacco Abuse She is working on quitting. We discussed the importance of cessation and different strategies for quitting.      Signed, Karen Newcomer, PA-C  8:20 AM 02/11/2012

## 2012-02-12 ENCOUNTER — Encounter: Payer: 59 | Admitting: Nurse Practitioner

## 2012-02-14 ENCOUNTER — Other Ambulatory Visit: Payer: Self-pay | Admitting: Family Medicine

## 2012-02-19 ENCOUNTER — Other Ambulatory Visit (INDEPENDENT_AMBULATORY_CARE_PROVIDER_SITE_OTHER): Payer: 59

## 2012-02-19 DIAGNOSIS — I2510 Atherosclerotic heart disease of native coronary artery without angina pectoris: Secondary | ICD-10-CM

## 2012-02-19 LAB — HEPATIC FUNCTION PANEL
ALT: 16 U/L (ref 0–35)
AST: 15 U/L (ref 0–37)
Albumin: 3.6 g/dL (ref 3.5–5.2)
Alkaline Phosphatase: 98 U/L (ref 39–117)
Bilirubin, Direct: 0.1 mg/dL (ref 0.0–0.3)
Total Bilirubin: 0.4 mg/dL (ref 0.3–1.2)
Total Protein: 6.4 g/dL (ref 6.0–8.3)

## 2012-02-19 LAB — BASIC METABOLIC PANEL
BUN: 9 mg/dL (ref 6–23)
CO2: 26 mEq/L (ref 19–32)
Calcium: 8.5 mg/dL (ref 8.4–10.5)
Chloride: 109 mEq/L (ref 96–112)
Creatinine, Ser: 0.8 mg/dL (ref 0.4–1.2)
GFR: 85.19 mL/min (ref >60.00)
Glucose, Bld: 171 mg/dL — ABNORMAL HIGH (ref 70–99)
Potassium: 4.2 mEq/L (ref 3.5–5.1)
Sodium: 139 mEq/L (ref 135–145)

## 2012-02-19 LAB — LIPID PANEL
Cholesterol: 107 mg/dL (ref 0–200)
HDL: 30.5 mg/dL — ABNORMAL LOW (ref >39.00)
LDL Cholesterol: 51 mg/dL (ref 0–99)
Total CHOL/HDL Ratio: 4
Triglycerides: 127 mg/dL (ref 0.0–149.0)
VLDL: 25.4 mg/dL (ref 0.0–40.0)

## 2012-03-25 ENCOUNTER — Ambulatory Visit: Payer: 59 | Admitting: Cardiology

## 2012-03-30 ENCOUNTER — Telehealth: Payer: Self-pay | Admitting: Cardiology

## 2012-03-30 NOTE — Telephone Encounter (Signed)
Please return call to patient [phone] , regarding generic RX for Effient med which is too expensive for patient.

## 2012-03-30 NOTE — Telephone Encounter (Signed)
Spoke with pt and told her a generic for Effient is not available. She is asking if she can be changed to a cheaper alternative. I told her I would send message to Dr. Shirlee Latch.  Pt states she has co pay card and Effient will still cost her $150 per month. Previous cost was $85 per month. She will also check with her insurance to see why cost has gone up.  I told pt I would leave 3 week supply of Effient at front desk for her to pick up.  Effient --10 mg--21 tablets--exp. 7/14--Lot [id number] left at front desk.

## 2012-04-01 NOTE — Telephone Encounter (Signed)
Reviewed with Dr Shirlee Latch. He would like for pt to be able to continue Effient. I spoke with pt. Pt states her insurance company told her Effient had gone to a tier 4 co-pay. It was going to cost her $200 but she had a co-pay card that brought her cost down to $150. She has enough Effient to last until appt with Dr Shirlee Latch 04/12/12.  She will discuss this more with Dr Shirlee Latch at time of appt 04/12/12.

## 2012-04-04 ENCOUNTER — Encounter: Payer: Self-pay | Admitting: Family Medicine

## 2012-04-04 ENCOUNTER — Ambulatory Visit (INDEPENDENT_AMBULATORY_CARE_PROVIDER_SITE_OTHER): Payer: 59 | Admitting: Family Medicine

## 2012-04-04 VITALS — BP 128/70 | HR 68 | Temp 98.3°F | Ht 67.0 in | Wt 189.0 lb

## 2012-04-04 DIAGNOSIS — F418 Other specified anxiety disorders: Secondary | ICD-10-CM

## 2012-04-04 DIAGNOSIS — F341 Dysthymic disorder: Secondary | ICD-10-CM

## 2012-04-04 DIAGNOSIS — F17200 Nicotine dependence, unspecified, uncomplicated: Secondary | ICD-10-CM

## 2012-04-04 MED ORDER — BUPROPION HCL ER (XL) 150 MG PO TB24: 150.0000 mg | ORAL_TABLET | Freq: Every day | ORAL | Status: DC

## 2012-04-04 MED ORDER — ALPRAZOLAM 0.25 MG PO TABS: 0.2500 mg | ORAL_TABLET | Freq: Two times a day (BID) | ORAL | Status: DC | PRN

## 2012-04-04 NOTE — Assessment & Plan Note (Signed)
Discussed different smoking cessation methods Start wellbutrin. rtc 1 mo for f/u

## 2012-04-04 NOTE — Assessment & Plan Note (Signed)
Deteriorated. Will add wellbutrin as adjuvant.  Discussed if not improved would increase celexa. Also provided alprazolam for temporary prn use. If not better, consider increasing celexa.

## 2012-04-04 NOTE — Progress Notes (Signed)
  Subjective:    Patient ID: Karen Osborne, female    DOB: Oct 15, 1955, 56 y.o.   MRN: [id number]  HPI CC: several issues.  NSTEMI s/p DES stent to LAD 01/2012.  On effient and aspirin.  Mood - worsening for last 2-3 weeks.  Worsening nerve issues.  Having feeling of dread "waiting for something bad to happen".  Trouble concentrating at work.  Sleeping varies, appetite down, energy level down, concentration down.  + anhedonia.  No SI.  + excessively worrying about things.  Feeling overwhelmed - "I don't want to deal with things".  More irritable.  Smoking - doesn't want patch.  <1ppd.  Would like to try wellbutrin as had friend who had success with this.  Wt Readings from Last 3 Encounters:  04/04/12 189 lb (85.73 kg)  02/11/12 185 lb (83.915 kg)  01/29/12 180 lb 12.4 oz (82 kg)   Past Medical History  Diagnosis Date  . Hypertension   . Tobacco abuse   . Hypercholesteremia   . Restless leg syndrome   . NSVD (normal spontaneous vaginal delivery) 1988  . Osteopenia   . Depression   . Coronary artery disease     s/p NSTEMI - LHC 01/27/12: Proximal LAD 20%, mid LAD 99%, proximal RCA 30%, mid RCA 50%, distal RCA 30%, EF 60%, apical HK, mild to moderate MR.  PCI: Promus DES to the mid LAD.  Echocardiogram 01/28/12: Mild LVH, EF 55%, severe HK of the apical septum and true apex, grade 2 diastolic dysfunction, trivial MR, mild LAE  . Glucose intolerance (impaired glucose tolerance)     A1C 6.1 01/2012     Review of Systems Per HPI    Objective:   Physical Exam  Nursing note and vitals reviewed. Constitutional: She appears well-developed and well-nourished. No distress.  Psychiatric: Her behavior is normal. Judgment and thought content normal.       Tearful with discussion of stress and mood       Assessment & Plan:

## 2012-04-04 NOTE — Patient Instructions (Signed)
Look into [email or web address] xanax to use as needed for anxiety Start wellbutrin 150mg  daily. Return in 1 month for follow up. Good to see you today, call us with questions.

## 2012-04-12 ENCOUNTER — Ambulatory Visit (INDEPENDENT_AMBULATORY_CARE_PROVIDER_SITE_OTHER): Payer: 59 | Admitting: Cardiology

## 2012-04-12 ENCOUNTER — Encounter: Payer: Self-pay | Admitting: Cardiology

## 2012-04-12 VITALS — BP 126/78 | HR 82 | Ht 67.0 in | Wt 187.0 lb

## 2012-04-12 DIAGNOSIS — E7800 Pure hypercholesterolemia, unspecified: Secondary | ICD-10-CM

## 2012-04-12 DIAGNOSIS — I2510 Atherosclerotic heart disease of native coronary artery without angina pectoris: Secondary | ICD-10-CM

## 2012-04-12 DIAGNOSIS — F17200 Nicotine dependence, unspecified, uncomplicated: Secondary | ICD-10-CM

## 2012-04-12 MED ORDER — TICAGRELOR 90 MG PO TABS: 90.0000 mg | ORAL_TABLET | Freq: Two times a day (BID) | ORAL | Status: DC

## 2012-04-12 NOTE — Patient Instructions (Addendum)
You have been referred to Cardiac Rehab at [facility].  Your physician recommends that you schedule a follow-up appointment in: 4 months with Dr Shirlee Latch.

## 2012-04-12 NOTE — Progress Notes (Signed)
Patient ID: Karen Osborne, female   DOB: Dec 31, 1955, 56 y.o.   MRN: [id number] PCP: Dr. Sharen Hones  56 yo with history of smoking and CAD s/p recent NSTEMI and Promus DES to mLAD presents for cardiology followup.  She has done well in general since discharge from NSTEMI in 5/13.  No chest pain.  No exertional dyspnea.  She is back at work.  She has not started cardiac rehab.  She is still smoking and has started Wellbutrin.  She says that Effient is too expensive for her to continue.   Labs (6/13): LDL 51, HDL 31, K 4.2, creatinine 0.8  PMH: 1. Restless leg syndrome 2. Impaired fasting glucose 3. Depression 4. HTN 5. Hyperlipidemia 6. Active smoker 7. CAD: NSTEMI 5/13.  LHC showed 99% mLAD, 50% mRCA, EF 60% with apical hypokinesis.  Promus DES to mLAD.  Echo (5/13) with EF 55%, mild LVH, severe hypokinesis of the apical septum and true apex, grade II diastolic dysfunction.    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   FH: Father with MI at 93.   ROS: All systems reviewed and negative except as per HPI.   Current Outpatient Prescriptions  Medication Sig Dispense Refill  . ALPRAZolam (XANAX) 0.25 MG tablet Take 1 tablet (0.25 mg total) by mouth 2 (two) times daily as needed for anxiety.  30 tablet  0  . aspirin EC 81 MG EC tablet Take 1 tablet (81 mg total) by mouth daily.      Marland Kitchen atorvastatin (LIPITOR) 80 MG tablet Take 1 tablet (80 mg total) by mouth at bedtime.  30 tablet  6  . buPROPion (WELLBUTRIN XL) 150 MG 24 hr tablet Take 1 tablet (150 mg total) by mouth daily.  30 tablet  3  . carvedilol (COREG) 6.25 MG tablet Take 1 tablet (6.25 mg total) by mouth 2 (two) times daily with a meal.  60 tablet  6  . citalopram (CELEXA) 10 MG tablet Take 10 mg by mouth daily.      . cyanocobalamin (,VITAMIN B-12,) 1000 MCG/ML injection Inject 1,000 mcg into the muscle every 30 (thirty) days. Next dose at the end of may  1 mL  0  . diphenhydramine-acetaminophen (TYLENOL PM EXTRA  STRENGTH) 25-500 MG TABS Take 2 tablets by mouth at bedtime.       Marland Kitchen lisinopril (PRINIVIL,ZESTRIL) 20 MG tablet Take 1 tablet (20 mg total) by mouth daily.  30 tablet  11  . nitroGLYCERIN (NITROSTAT) 0.4 MG SL tablet Place 1 tablet (0.4 mg total) under the tongue every 5 (five) minutes as needed for chest pain (up to 3 doses).  25 tablet  4  . rOPINIRole (REQUIP) 1 MG tablet TAKE 1-2 TABLETS BY MOUTH AT BEDTIME  60 tablet  5  . DISCONTD: prasugrel (EFFIENT) 10 MG TABS Take 1 tablet (10 mg total) by mouth daily.  30 tablet  11  . prasugrel (EFFIENT) 10 MG TABS Take 1 tablet (10 mg total) by mouth daily.      Marland Kitchen DISCONTD: citalopram (CELEXA) 10 MG tablet Take 1 tablet (10 mg total) by mouth daily.  30 tablet  6    BP 126/78  Pulse 82  Ht 5\' 7"  (1.702 m)  Wt 187 lb (84.823 kg)  BMI 29.29 kg/m2  SpO2 98% General: NAD Neck: No JVD, no thyromegaly or thyroid nodule.  Lungs: Clear to auscultation bilaterally with normal respiratory effort. CV: Nondisplaced PMI.  Heart regular S1/S2, no S3/S4, no  murmur.  No peripheral edema.  No carotid bruit.  Normal pedal pulses.  Abdomen: Soft, nontender, no hepatosplenomegaly, no distention.  Neurologic: Alert and oriented x 3.  Psych: Normal affect. Extremities: No clubbing or cyanosis.

## 2012-04-12 NOTE — Assessment & Plan Note (Signed)
Lipids at goal when last checked (LDL < 70).  

## 2012-04-12 NOTE — Assessment & Plan Note (Signed)
No ischemic symptoms.  She needs to start cardiac rehab.  She says that Effient is too expensive.  She had a Promus DES to LAD.  I would like her to try to at least make it 6 months with Effient.  At that point, we could switch to Plavix.   We will give her samples today.  Continue Effient, ASA 81, Coreg, lisinopril, and statin.

## 2012-04-12 NOTE — Assessment & Plan Note (Signed)
I strongly encouraged her to stop smoking. She is taking Wellbutrin.

## 2012-05-23 ENCOUNTER — Encounter: Payer: Self-pay | Admitting: Cardiology

## 2012-05-27 ENCOUNTER — Other Ambulatory Visit: Payer: Self-pay

## 2012-05-27 MED ORDER — ALPRAZOLAM 0.25 MG PO TABS: 0.2500 mg | ORAL_TABLET | Freq: Two times a day (BID) | ORAL | Status: AC | PRN

## 2012-05-27 NOTE — Telephone Encounter (Signed)
plz phone in. 

## 2012-05-27 NOTE — Telephone Encounter (Signed)
Called in Xanax as prescribed to pharm, called pt an advise her that her med was called in to pharm

## 2012-05-27 NOTE — Telephone Encounter (Signed)
Pt request refill alprazolam to [facility].Please advise.  

## 2012-06-14 ENCOUNTER — Encounter: Payer: Self-pay | Admitting: Cardiology

## 2012-07-11 ENCOUNTER — Telehealth: Payer: Self-pay | Admitting: Cardiology

## 2012-07-11 MED ORDER — PRASUGREL HCL 10 MG PO TABS: 10.0000 mg | ORAL_TABLET | Freq: Every day | ORAL | Status: DC

## 2012-07-11 NOTE — Telephone Encounter (Signed)
Spoke with pt . Currently there are no samples of Effient. Pt is aware. I will send a prescription for  Effient in to pharmacy for pt.

## 2012-07-11 NOTE — Telephone Encounter (Signed)
Pt would like samples of effient to last until next OV

## 2012-07-15 ENCOUNTER — Encounter: Payer: Self-pay | Admitting: Cardiology

## 2012-07-22 ENCOUNTER — Other Ambulatory Visit: Payer: Self-pay | Admitting: Family Medicine

## 2012-07-22 NOTE — Telephone Encounter (Signed)
Ok to refill in Dr. Timoteo Expose absence? Last filled 05/27/12. Please send back to me.

## 2012-07-22 NOTE — Telephone Encounter (Signed)
Please call in

## 2012-07-25 NOTE — Telephone Encounter (Signed)
Rx called in as directed.   

## 2012-07-29 ENCOUNTER — Encounter: Payer: Self-pay | Admitting: Cardiology

## 2012-07-29 ENCOUNTER — Ambulatory Visit (INDEPENDENT_AMBULATORY_CARE_PROVIDER_SITE_OTHER): Payer: 59 | Admitting: Cardiology

## 2012-07-29 VITALS — BP 140/70 | HR 63 | Ht 66.0 in | Wt 191.0 lb

## 2012-07-29 DIAGNOSIS — F17200 Nicotine dependence, unspecified, uncomplicated: Secondary | ICD-10-CM

## 2012-07-29 DIAGNOSIS — I2510 Atherosclerotic heart disease of native coronary artery without angina pectoris: Secondary | ICD-10-CM

## 2012-07-29 DIAGNOSIS — I10 Essential (primary) hypertension: Secondary | ICD-10-CM

## 2012-07-29 DIAGNOSIS — E7800 Pure hypercholesterolemia, unspecified: Secondary | ICD-10-CM

## 2012-07-29 MED ORDER — CLOPIDOGREL BISULFATE 75 MG PO TABS: 75.0000 mg | ORAL_TABLET | Freq: Every day | ORAL | Status: DC

## 2012-07-29 NOTE — Patient Instructions (Addendum)
Stop Effient.   Start Plavix 75mg  daily. Take this in the place of Effient.   Use a nicotine patch 14mg /24 hr to help you stop smoking.   Your physician recommends that you return for a FASTING lipid profile: December 2013.  Your physician wants you to follow-up in: 6 months with Dr Shirlee Latch.(May 2014).  You will receive a reminder letter in the mail two months in advance. If you don't receive a letter, please call our office to schedule the follow-up appointment.

## 2012-07-31 NOTE — Progress Notes (Signed)
Patient ID: CLEOPHA INDELICATO, female   DOB: 01-20-1956, 56 y.o.   MRN: [id number] PCP: Dr. Sharen Hones  56 yo with history of smoking and CAD s/p recent NSTEMI and Promus DES to mLAD presents for cardiology followup.  She has done well in general since discharge from NSTEMI in 5/13.  No chest pain.  No exertional dyspnea.  She is back at work.  She is doing cardiac rehab.  She is still smoking but has cut back.  She is using Wellbutrin.   ECG: NSR, normal   Labs (6/13): LDL 51, HDL 31, K 4.2, creatinine 0.8  PMH: 1. Restless leg syndrome 2. Impaired fasting glucose 3. Depression 4. HTN 5. Hyperlipidemia 6. Active smoker 7. CAD: NSTEMI 5/13.  LHC showed 99% mLAD, 50% mRCA, EF 60% with apical hypokinesis.  Promus DES to mLAD.  Echo (5/13) with EF 55%, mild LVH, severe hypokinesis of the apical septum and true apex, grade II diastolic dysfunction.    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 Smokes about 3/4 ppd.   FH: Father with MI at 83.   ROS: All systems reviewed and negative except as per HPI.   Current Outpatient Prescriptions  Medication Sig Dispense Refill  . ALPRAZolam (XANAX) 0.25 MG tablet TAKE 1 TABLET BY MOUTH TWICE DAILY AS NEEDED FOR ANXIETY  30 tablet  0  . aspirin EC 81 MG EC tablet Take 1 tablet (81 mg total) by mouth daily.      Marland Kitchen atorvastatin (LIPITOR) 80 MG tablet Take 1 tablet (80 mg total) by mouth at bedtime.  30 tablet  6  . buPROPion (WELLBUTRIN XL) 150 MG 24 hr tablet Take 1 tablet (150 mg total) by mouth daily.  30 tablet  3  . carvedilol (COREG) 6.25 MG tablet Take 1 tablet (6.25 mg total) by mouth 2 (two) times daily with a meal.  60 tablet  6  . citalopram (CELEXA) 10 MG tablet Take 10 mg by mouth daily.      . diphenhydramine-acetaminophen (TYLENOL PM EXTRA STRENGTH) 25-500 MG TABS Take 2 tablets by mouth at bedtime.       Marland Kitchen lisinopril (PRINIVIL,ZESTRIL) 20 MG tablet Take 1 tablet (20 mg total) by mouth daily.  30 tablet  11  .  nitroGLYCERIN (NITROSTAT) 0.4 MG SL tablet Place 1 tablet (0.4 mg total) under the tongue every 5 (five) minutes as needed for chest pain (up to 3 doses).  25 tablet  4  . rOPINIRole (REQUIP) 1 MG tablet TAKE 1-2 TABLETS BY MOUTH AT BEDTIME  60 tablet  5  . clopidogrel (PLAVIX) 75 MG tablet Take 1 tablet (75 mg total) by mouth daily.  30 tablet  6  . nicotine (NICODERM CQ) 14 mg/24hr patch Place 1 patch onto the skin daily.      . [DISCONTINUED] citalopram (CELEXA) 10 MG tablet Take 1 tablet (10 mg total) by mouth daily.  30 tablet  6    BP 140/70  Pulse 63  Ht 5\' 6"  (1.676 m)  Wt 191 lb (86.637 kg)  BMI 30.83 kg/m2 General: NAD Neck: No JVD, no thyromegaly or thyroid nodule.  Lungs: Clear to auscultation bilaterally with normal respiratory effort. CV: Nondisplaced PMI.  Heart regular S1/S2, no S3/S4, no murmur.  No peripheral edema.  No carotid bruit.  Normal pedal pulses.  Abdomen: Soft, nontender, no hepatosplenomegaly, no distention.  Neurologic: Alert and oriented x 3.  Psych: Normal affect. Extremities: No clubbing or cyanosis.  Assessment/Plan: 1. CAD: Effient is too expensive for her.  She has been on Effient for 6 months post-Promus DES.  I will let her stop Effient and start on clopidogrel instead.  She will need to continue this at least until 5/14.  Continue ASA 81, statin, Coreg, lisinopril.  2. Smoking: She continues to smoke despite Wellbutrin.  I strongly encouraged her to quit.  She will try using nicotine patches.   3. HTN: BP has been well-controlled when it is checked at cardiac rehab.  4. Hyperlipidemia: Lipids need to be drawn in 12/13, goal LDL < 70.   Marca Ancona 07/31/2012

## 2012-08-14 ENCOUNTER — Encounter: Payer: Self-pay | Admitting: Family Medicine

## 2012-08-14 ENCOUNTER — Encounter: Payer: Self-pay | Admitting: Cardiology

## 2012-08-17 ENCOUNTER — Other Ambulatory Visit: Payer: 59

## 2012-08-26 ENCOUNTER — Other Ambulatory Visit: Payer: Self-pay | Admitting: Physician Assistant

## 2012-09-08 ENCOUNTER — Other Ambulatory Visit: Payer: Self-pay | Admitting: Physician Assistant

## 2012-12-19 ENCOUNTER — Other Ambulatory Visit: Payer: Self-pay | Admitting: Family Medicine

## 2012-12-19 NOTE — Telephone Encounter (Signed)
Ok to refill 

## 2013-02-08 ENCOUNTER — Other Ambulatory Visit: Payer: Self-pay | Admitting: Cardiology

## 2013-02-08 NOTE — Telephone Encounter (Signed)
atorvastatin (LIPITOR) 80 MG tablet  Take 1 tablet (80 mg total) by mouth at bedtime.   30 tablet    Patient Instructions    Stop Effient.  Start Plavix 75mg  daily. Take this in the place of Effient.  Use a nicotine patch 14mg /24 hr to help you stop smoking.  Your physician recommends that you return for a FASTING lipid profile: December 2013. Your physician wants you to follow-up in: 6 months with Dr Shirlee Latch.(May 2014).  You will receive a reminder letter in the mail two months in advance. If you don't receive a letter, please call our office to schedule the follow-up appointment.  Provider Department Encounter #    07/22/2012  3:07 PM Marca Ancona, MD [facility] [id number]

## 2013-03-09 ENCOUNTER — Other Ambulatory Visit: Payer: Self-pay | Admitting: Physician Assistant

## 2013-03-09 ENCOUNTER — Other Ambulatory Visit: Payer: Self-pay | Admitting: Cardiology

## 2013-05-06 ENCOUNTER — Other Ambulatory Visit: Payer: Self-pay | Admitting: Cardiology

## 2013-05-19 ENCOUNTER — Other Ambulatory Visit: Payer: Self-pay | Admitting: Cardiology

## 2013-07-14 ENCOUNTER — Other Ambulatory Visit: Payer: Self-pay | Admitting: Cardiology

## 2013-08-01 ENCOUNTER — Other Ambulatory Visit: Payer: Self-pay | Admitting: Cardiology

## 2013-08-23 ENCOUNTER — Other Ambulatory Visit: Payer: Self-pay | Admitting: Cardiology

## 2013-09-10 ENCOUNTER — Other Ambulatory Visit: Payer: Self-pay | Admitting: Cardiology

## 2013-11-05 ENCOUNTER — Other Ambulatory Visit: Payer: Self-pay | Admitting: Cardiology

## 2013-11-10 ENCOUNTER — Other Ambulatory Visit: Payer: Self-pay

## 2013-11-10 MED ORDER — ATORVASTATIN CALCIUM 80 MG PO TABS: ORAL_TABLET | ORAL | Status: DC

## 2013-11-24 ENCOUNTER — Other Ambulatory Visit: Payer: Self-pay | Admitting: Cardiology

## 2013-12-15 ENCOUNTER — Other Ambulatory Visit: Payer: Self-pay | Admitting: Cardiology

## 2013-12-15 ENCOUNTER — Other Ambulatory Visit: Payer: Self-pay | Admitting: Family Medicine

## 2013-12-18 NOTE — Telephone Encounter (Signed)
You can give her a limited amount but she needs to schedule an appt before Dr Aundra Dubin can fill long term. She can see PA/NP.

## 2014-02-10 ENCOUNTER — Other Ambulatory Visit: Payer: Self-pay | Admitting: Cardiology

## 2014-03-01 ENCOUNTER — Other Ambulatory Visit: Payer: Self-pay | Admitting: Cardiology

## 2014-03-02 ENCOUNTER — Telehealth: Payer: Self-pay | Admitting: *Deleted

## 2014-03-02 NOTE — Telephone Encounter (Signed)
Can this be refilled again? Patient has had several warnings to call for an appointment, and has failed to do so. Please advise. Thanks, MI

## 2014-03-02 NOTE — Telephone Encounter (Signed)
Please call her and tell her she needs to schedule an appt with Dr Aundra Dubin for him to continue filling her medication. [place], Dr Claris Gladden scheduler can make an appt for her with Dr Aundra Dubin. Once the appt is scheduled you give her enough medication to last until the appt with Dr Aundra Dubin.

## 2014-03-02 NOTE — Telephone Encounter (Signed)
Can you please call this patient and schedule them an appointment so that I can refill their plavix? Thanks, MI

## 2014-03-06 NOTE — Telephone Encounter (Signed)
lvm to inform pt of her appt for 06/01/14 @ 10:15 w/Dr Aundra Dubin.

## 2014-03-10 ENCOUNTER — Other Ambulatory Visit: Payer: Self-pay | Admitting: Cardiology

## 2014-03-13 ENCOUNTER — Telehealth: Payer: Self-pay | Admitting: Cardiology

## 2014-03-13 NOTE — Telephone Encounter (Signed)
OK - agree

## 2014-03-13 NOTE — Telephone Encounter (Signed)
New Message  Pt called reports that she is not feeling well. Sharp pain occurs every 1-2 hours and she is afraid that it is heart related. Request a call back from the nurse to discuss. please call

## 2014-03-13 NOTE — Telephone Encounter (Signed)
Pt states earlier today while coming back from [place] she had a sharp chest pain under her right breast in to her back that felt like indigestion.  She said it lasted 10-15 mins, not associated with any other symptoms and has never had pain like this before. She said it returned a little later but not as severe and did not last as long. Pt denies any chest pain now.

## 2014-03-13 NOTE — Telephone Encounter (Signed)
I have scheduled pt to see Margaret Pyle  03/14/14 at 8:30AM. I advised pt that if she has recurrent symptoms at all she should use NTG, report to [facility] ED, and not drive.   I will forward to Dr Aundra Dubin for review and further recommendations, if any.

## 2014-03-14 ENCOUNTER — Telehealth: Payer: Self-pay | Admitting: *Deleted

## 2014-03-14 ENCOUNTER — Encounter: Payer: Self-pay | Admitting: Physician Assistant

## 2014-03-14 ENCOUNTER — Ambulatory Visit (INDEPENDENT_AMBULATORY_CARE_PROVIDER_SITE_OTHER): Payer: BC Managed Care – PPO | Admitting: Physician Assistant

## 2014-03-14 VITALS — BP 120/75 | HR 70 | Ht 66.0 in | Wt 190.0 lb

## 2014-03-14 DIAGNOSIS — I2510 Atherosclerotic heart disease of native coronary artery without angina pectoris: Secondary | ICD-10-CM

## 2014-03-14 DIAGNOSIS — I10 Essential (primary) hypertension: Secondary | ICD-10-CM

## 2014-03-14 DIAGNOSIS — F17200 Nicotine dependence, unspecified, uncomplicated: Secondary | ICD-10-CM

## 2014-03-14 DIAGNOSIS — R0789 Other chest pain: Secondary | ICD-10-CM

## 2014-03-14 DIAGNOSIS — E7800 Pure hypercholesterolemia, unspecified: Secondary | ICD-10-CM

## 2014-03-14 LAB — BASIC METABOLIC PANEL
BUN: 10 mg/dL (ref 6–23)
CO2: 27 mEq/L (ref 19–32)
Calcium: 9.2 mg/dL (ref 8.4–10.5)
Chloride: 106 mEq/L (ref 96–112)
Creatinine, Ser: 0.7 mg/dL (ref 0.4–1.2)
GFR: 90.08 mL/min (ref >60.00)
Glucose, Bld: 119 mg/dL — ABNORMAL HIGH (ref 70–99)
Potassium: 3.9 mEq/L (ref 3.5–5.1)
Sodium: 139 mEq/L (ref 135–145)

## 2014-03-14 LAB — TROPONIN I: Troponin I: <0.3 ng/mL (ref <0.06)

## 2014-03-14 LAB — HEPATIC FUNCTION PANEL
ALT: 19 U/L (ref 0–35)
AST: 15 U/L (ref 0–37)
Albumin: 4 g/dL (ref 3.5–5.2)
Alkaline Phosphatase: 99 U/L (ref 39–117)
Bilirubin, Direct: 0 mg/dL (ref 0.0–0.3)
Total Bilirubin: 1 mg/dL (ref 0.2–1.2)
Total Protein: 7.3 g/dL (ref 6.0–8.3)

## 2014-03-14 LAB — D-DIMER, QUANTITATIVE: D-Dimer, Quant: <0.27 ug/mL-FEU (ref 0.00–0.48)

## 2014-03-14 NOTE — Progress Notes (Signed)
Cardiology Office Note    Date:  03/14/2014   ID:  Karen Osborne, DOB 1956-05-09, MRN [id number]  PCP:  [name], MD  Cardiologist:  Dr. Loralie Champagne      History of Present Illness: Karen Osborne is a 58 y.o. female with a hx of CAD s/p NSTEMI 01/2012 tx with DES to mid LAD, HTN, HL, tobacco abuse.  Last seen by Dr. Loralie Champagne 07/2012.  The patient developed sudden onset of sharp right-sided chest pain yesterday while driving from [place]. 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 The discomfort was under her right breast and radiated to her back. She then felt like she had indigestion. Symptoms lasted about 2-3 hours. She did not take any medications. She has had no recurrence. She denies any associated symptoms. She denies exertional chest discomfort. She denies pleuritic chest pain or chest pain with lying supine. She does note some dyspnea with exertion. This is with more extreme activities. She is NYHA class II-IIb. She denies orthopnea, PND or edema. She denies syncope. She does admit to increased emotional stress.   Studies:  - LHC (01/27/12):  prox LAD 20%, mid LAD 99%, prox OM 30%, prox RCA 30%, mid RCA 50%, dist RCA 30%, EF 60%.  PCI:  2.25 x 16 mm Promus Element DES to mid LAD.    - Echo (01/28/12):  Mild LVH, EF 55%, apical septum and true apex severe HK, Gr 2 DD, mild LAE, normal RVF    Recent Labs: No results found for requested labs within last 365 days.  Wt Readings from Last 3 Encounters:  03/14/14 190 lb (86.183 kg)  07/29/12 191 lb (86.637 kg)  04/12/12 187 lb (84.823 kg)     Past Medical History  Diagnosis Date  . Hypertension   . Tobacco abuse   . Hypercholesteremia   . Restless leg syndrome   . NSVD (normal spontaneous vaginal delivery) 1988  . Osteopenia   . Depression   . Coronary artery disease     completed cards rehab [facility] (07/2012); s/p NSTEMI - LHC 01/27/12: Proximal LAD 20%, mid LAD 99%, proximal RCA 30%, mid RCA 50%, distal RCA 30%, EF 60%,  apical HK, mild to moderate MR.  PCI: Promus DES to the mid LAD.  Echocardiogram 01/28/12: Mild LVH, EF 55%, severe HK of the apical septum and true apex, grade 2 diastolic dysfunction, trivial MR, mild LAE  . Glucose intolerance (impaired glucose tolerance)     A1C 6.1 01/2012    Current Outpatient Prescriptions  Medication Sig Dispense Refill  . atorvastatin (LIPITOR) 80 MG tablet TAKE 1 TABLET BY MOUTH AT BEDTIME  15 tablet  0  . buPROPion (WELLBUTRIN XL) 150 MG 24 hr tablet Take 1 tablet (150 mg total) by mouth daily.  30 tablet  3  . carvedilol (COREG) 6.25 MG tablet TAKE 1 TABLET BY MOUTH TWICE DAILY WITH A MEAL  60 tablet  0  . citalopram (CELEXA) 10 MG tablet Take 10 mg by mouth daily.      . clopidogrel (PLAVIX) 75 MG tablet TAKE 1 TABLET BY MOUTH EVERY DAY  30 tablet  2  . diphenhydramine-acetaminophen (TYLENOL PM EXTRA STRENGTH) 25-500 MG TABS Take 2 tablets by mouth at bedtime.       Marland Kitchen lisinopril (PRINIVIL,ZESTRIL) 20 MG tablet TAKE 1 TABLET BY MOUTH EVERY DAY  30 tablet  6  . nitroGLYCERIN (NITROSTAT) 0.4 MG SL tablet Place 1 tablet (0.4 mg total) under the tongue  every 5 (five) minutes as needed for chest pain (up to 3 doses).  25 tablet  4  . rOPINIRole (REQUIP) 1 MG tablet TAKE 1-2 TABLETS BY MOUTH AT BEDTIME  60 tablet  6   No current facility-administered medications for this visit.    Allergies:   Review of patient's allergies indicates no known allergies.   Social History:  The patient  reports that she has been smoking Cigarettes.  She has a 19 pack-year smoking history. She has never used smokeless tobacco. She reports that she does not drink alcohol or use illicit drugs.   Family History:  The patient's family history includes Cancer in her father; Diabetes in her sister; Heart disease (age of onset: 58) in her father; Hypertension in her father.   ROS:  Please see the history of present illness.   He has a chronic cough.   All other systems reviewed and negative.    PHYSICAL EXAM: VS:  BP 120/75  Pulse 70  Ht 5\' 6"  (1.676 m)  Wt 190 lb (86.183 kg)  BMI 30.68 kg/m2 Well nourished, well developed, in no acute distress HEENT: normal Neck: no JVD Vascular: No carotid bruits bilaterally Cardiac:  normal S1, S2; RRR; no murmur Lungs:  clear to auscultation bilaterally, no wheezing, rhonchi or rales Abd: soft, nontender, no hepatomegaly Ext: no edema Skin: warm and dry Neuro:  CNs 2-12 intact, no focal abnormalities noted  EKG:  NSR, HR 70, normal axis, no significant ST changes when compared to prior tracing      ASSESSMENT AND PLAN:  1. Other chest pain:  Symptoms are somewhat atypical for ischemia. She had back pain and indigestion with her non-STEMI in 2013. She has noted increased fatigue with activity. She continues to smoke. 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 I will obtain a troponin and d-dimer today. If her troponin is abnormal, she will need to be sent to the hospital for admission and cardiac catheterization. If her d-dimer is abnormal, she will need chest CTA to rule out pulmonary embolism. I will obtain a basic metabolic panel today as well. Arrange ETT-Myoview. 2. CAD s/p NSTEMI tx with DES to LAD 01/2012:  Continue aspirin, Plavix, statin. Proceed with Myoview if troponin is normal. 3. HYPERTENSION:  Controlled. 4. HYPERCHOLESTEROLEMIA:  Continue statin. Obtain LFTs. Arrange fasting lipids. 5. TOBACCO ABUSE:  She is trying to quit 6. Disposition: Keep follow up with Dr. Aundra Dubin in September. Return sooner if Myoview abnormal.   Signed, Versie Starks, MHS 03/14/2014 8:42 AM    [address] [place], [place], [place]  [address] Phone: [phone]; Fax: [phone]

## 2014-03-14 NOTE — Patient Instructions (Signed)
LAB WORK TODAY; BMET, LFT, TROPONIN (STAT), D-DIMER      YOU WILL NEED A FASTING LIPID PANEL WHEN YOU COME IN FOR YOUR STRESS TEST Your physician has requested that you have en exercise stress myoview. For further information please visit [email or web address]. Please follow instruction sheet, as given.  KEEP YOUR FOLLOW UP WITH DR. Aundra Dubin 05/2014

## 2014-03-14 NOTE — Telephone Encounter (Signed)
pt notified of the rest of her lab results ,bmet, lft with verbal understanding

## 2014-03-14 NOTE — Telephone Encounter (Signed)
pt notified about STAT lab results with verbal understanding. Pt aware other lab results not in yet but once in and read I w/cb results

## 2014-03-21 ENCOUNTER — Ambulatory Visit (HOSPITAL_COMMUNITY): Payer: BC Managed Care – PPO | Attending: Cardiovascular Disease | Admitting: Radiology

## 2014-03-21 ENCOUNTER — Other Ambulatory Visit (INDEPENDENT_AMBULATORY_CARE_PROVIDER_SITE_OTHER): Payer: BC Managed Care – PPO

## 2014-03-21 VITALS — BP 135/60 | HR 66 | Ht 66.0 in | Wt 194.0 lb

## 2014-03-21 DIAGNOSIS — R0789 Other chest pain: Secondary | ICD-10-CM | POA: Insufficient documentation

## 2014-03-21 DIAGNOSIS — R0609 Other forms of dyspnea: Secondary | ICD-10-CM | POA: Insufficient documentation

## 2014-03-21 DIAGNOSIS — Z9861 Coronary angioplasty status: Secondary | ICD-10-CM | POA: Insufficient documentation

## 2014-03-21 DIAGNOSIS — R0989 Other specified symptoms and signs involving the circulatory and respiratory systems: Secondary | ICD-10-CM | POA: Insufficient documentation

## 2014-03-21 DIAGNOSIS — R079 Chest pain, unspecified: Secondary | ICD-10-CM

## 2014-03-21 DIAGNOSIS — F17200 Nicotine dependence, unspecified, uncomplicated: Secondary | ICD-10-CM | POA: Insufficient documentation

## 2014-03-21 DIAGNOSIS — E7800 Pure hypercholesterolemia, unspecified: Secondary | ICD-10-CM

## 2014-03-21 DIAGNOSIS — I10 Essential (primary) hypertension: Secondary | ICD-10-CM | POA: Insufficient documentation

## 2014-03-21 DIAGNOSIS — Z8249 Family history of ischemic heart disease and other diseases of the circulatory system: Secondary | ICD-10-CM | POA: Insufficient documentation

## 2014-03-21 DIAGNOSIS — I2510 Atherosclerotic heart disease of native coronary artery without angina pectoris: Secondary | ICD-10-CM | POA: Insufficient documentation

## 2014-03-21 DIAGNOSIS — R002 Palpitations: Secondary | ICD-10-CM | POA: Insufficient documentation

## 2014-03-21 DIAGNOSIS — I252 Old myocardial infarction: Secondary | ICD-10-CM | POA: Insufficient documentation

## 2014-03-21 LAB — LIPID PANEL
Cholesterol: 200 mg/dL (ref 0–200)
HDL: 31 mg/dL — ABNORMAL LOW (ref >39.00)
LDL Cholesterol: 138 mg/dL — ABNORMAL HIGH (ref 0–99)
NonHDL: 169
Total CHOL/HDL Ratio: 6
Triglycerides: 157 mg/dL — ABNORMAL HIGH (ref 0.0–149.0)
VLDL: 31.4 mg/dL (ref 0.0–40.0)

## 2014-03-21 MED ORDER — TECHNETIUM TC 99M SESTAMIBI GENERIC - CARDIOLITE
11.0000 | Freq: Once | INTRAVENOUS | Status: AC | PRN
  Administered 2014-03-21: 11 via INTRAVENOUS

## 2014-03-21 MED ORDER — TECHNETIUM TC 99M SESTAMIBI GENERIC - CARDIOLITE
33.0000 | Freq: Once | INTRAVENOUS | Status: AC | PRN
  Administered 2014-03-21: 33 via INTRAVENOUS

## 2014-03-21 NOTE — Progress Notes (Signed)
[facility] [address] [place], [place] [address] [phone]    Cardiology Nuclear Med Study  Karen Osborne is a 58 y.o. female     MRN : [id number]     DOB: 1955-12-24  Procedure Date: 03/21/2014  Nuclear Med Background Indication for Stress Test:  Evaluation for Ischemia and Stent Patency History:  CAD; MI; 2013 Cath/Stent; 2013 Echo-EF 55% Cardiac Risk Factors: Family History - CAD, Hypertension, Lipids and Smoker  Symptoms:  Chest Pain (last date of chest discomfort was last Tuesday), DOE, Fatigue and Palpitations   Nuclear Pre-Procedure Caffeine/Decaff Intake:  1:00am NPO After: 8:00pm   Lungs:  clear O2 Sat: 98% on room air. IV 0.9% NS with Angio Cath:  22g  IV Site: R Hand  IV Started by:  Karen Osborne, CNMT  Chest Size (in):  42 Cup Size: DDD  Height: 5\' 6"  (1.676 m)  Weight:  194 lb (87.998 kg)  BMI:  Body mass index is 31.33 kg/(m^2). Tech Comments:  Took coreg last night    Nuclear Med Study 1 or 2 day study: 1 day  Stress Test Type:  Stress  Reading MD: Karen Guiles, MD  Order Authorizing Provider:  Einar Crow, MD  Resting Radionuclide: Technetium 30m Sestamibi  Resting Radionuclide Dose: 11.0 mCi   Stress Radionuclide:  Technetium 38m Sestamibi  Stress Radionuclide Dose: 33.0 mCi           Stress Protocol Rest HR: 66 Stress HR: 137  Rest BP: 135/70 Stress BP: 226/118  Exercise Time (min): 7:00 METS: 7.0           Dose of Adenosine (mg):  n/a Dose of Lexiscan: n/a mg  Dose of Atropine (mg): n/a Dose of Dobutamine: n/a mcg/kg/min (at max HR)  Stress Test Technologist: Karen Osborne, BS-ES  Nuclear Technologist:  Karen Osborne, CNMT     Rest Procedure:  Myocardial perfusion imaging was performed at rest 45 minutes following the intravenous administration of Technetium 2m Sestamibi. Rest ECG: NSR - Normal EKG  Stress Procedure:  The patient exercised on the treadmill utilizing the Bruce Protocol for 7:00 minutes. The patient  stopped due to SOB, fatigue, hypertensive response and denied any chest pain.  Technetium 17m Sestamibi was injected at peak exercise and myocardial perfusion imaging was performed after a brief delay. Stress ECG: No significant ST segment change suggestive of ischemia. and There are scattered PVCs.  QPS Raw Data Images:  Normal; no motion artifact; normal heart/lung ratio. Stress Images:  Fixed apical, apicoseptal defect Rest Images:  Fixed apical, apicoseptal defect Subtraction (SDS):  No evidence of ischemia. Transient Ischemic Dilatation (Normal <1.22):  1.07 Lung/Heart Ratio (Normal <0.45):  0.43  Quantitative Gated Spect Images QGS EDV:  76 ml QGS ESV:  28 ml  Impression Exercise Capacity:  Good exercise capacity. BP Response:  Hypertensive blood pressure response. Clinical Symptoms:  No significant symptoms noted. ECG Impression:  No significant ST segment change suggestive of ischemia. Comparison with Prior Nuclear Study: No previous nuclear study performed  Overall Impression:  Low risk stress nuclear study with fixed apical defect which could represent breast attenuation artifact or less likely scar.  LV Ejection Fraction: 64%.  LV Wall Motion:  Normal Wall Motion  Karen Casino, MD, [facility] Board Certified in Nuclear Cardiology Attending Cardiologist [place]

## 2014-03-22 ENCOUNTER — Telehealth: Payer: Self-pay | Admitting: *Deleted

## 2014-03-22 ENCOUNTER — Encounter: Payer: Self-pay | Admitting: Physician Assistant

## 2014-03-22 DIAGNOSIS — E785 Hyperlipidemia, unspecified: Secondary | ICD-10-CM

## 2014-03-22 MED ORDER — ROSUVASTATIN CALCIUM 40 MG PO TABS: 40.0000 mg | ORAL_TABLET | Freq: Every day | ORAL | Status: DC

## 2014-03-22 NOTE — Telephone Encounter (Signed)
pt notified per Brynda Rim. PA to d/c lipitor and start crestor 40 mg, FLP/LFT 05/18/14.

## 2014-03-22 NOTE — Telephone Encounter (Signed)
pt noticed about lab results and myoview with verbal understanding. Per [place] asked pt if still taking lipitor 80 daily. Pt verified yes. Told pt I will need to d/w PA to see recommendation as to lipitor, pt advised of possible change in chol med.

## 2014-03-23 ENCOUNTER — Telehealth: Payer: Self-pay | Admitting: Cardiology

## 2014-03-23 NOTE — Telephone Encounter (Signed)
F/u ° ° °Pt returning your call °

## 2014-03-23 NOTE — Telephone Encounter (Signed)
lmtcb

## 2014-03-23 NOTE — Telephone Encounter (Signed)
Called stating Karen Osborne called her yesterday with lab results.  She went to pharmacy last PM and the Crestor has to be approved by insurance. States she honestly has not been taking Lipitor but about once a week because she forgets to take it.  States she takes her BP medicine but forgets Lipitor. She now has a pill box with medications.  She wants to try staying on Lipitor every night until she gets blood rechecled  9/4. Advised that is fine for her to stay on Lipitor every night.

## 2014-03-23 NOTE — Telephone Encounter (Signed)
New message    Patient receive a call on yesterday.     Patient stated she has not taken her medication like she should - would like to discuss.

## 2014-04-08 ENCOUNTER — Other Ambulatory Visit: Payer: Self-pay | Admitting: Cardiology

## 2014-04-22 ENCOUNTER — Other Ambulatory Visit: Payer: Self-pay | Admitting: Cardiology

## 2014-05-18 ENCOUNTER — Other Ambulatory Visit: Payer: BC Managed Care – PPO

## 2014-05-23 ENCOUNTER — Other Ambulatory Visit: Payer: Self-pay | Admitting: Cardiology

## 2014-05-25 ENCOUNTER — Encounter: Payer: Self-pay | Admitting: *Deleted

## 2014-05-29 ENCOUNTER — Ambulatory Visit: Payer: BC Managed Care – PPO | Admitting: Cardiology

## 2014-05-31 ENCOUNTER — Other Ambulatory Visit: Payer: Self-pay | Admitting: Cardiology

## 2014-06-01 ENCOUNTER — Ambulatory Visit: Payer: 59 | Admitting: Cardiology

## 2014-06-28 ENCOUNTER — Other Ambulatory Visit: Payer: Self-pay | Admitting: Cardiology

## 2014-07-16 ENCOUNTER — Telehealth: Payer: Self-pay | Admitting: Cardiology

## 2014-07-16 NOTE — Telephone Encounter (Signed)
Pt states she had lipid profile at her employer last week and she will bring a copy of these results with her to appt in December with Dr Aundra Dubin.

## 2014-07-16 NOTE — Telephone Encounter (Signed)
New problem    Pt want to know if she need to have labs before her appt. Please advise pt

## 2014-08-20 ENCOUNTER — Encounter: Payer: Self-pay | Admitting: Cardiology

## 2014-08-20 ENCOUNTER — Ambulatory Visit (INDEPENDENT_AMBULATORY_CARE_PROVIDER_SITE_OTHER): Payer: BC Managed Care – PPO | Admitting: Cardiology

## 2014-08-20 VITALS — BP 110/80 | HR 76 | Ht 66.0 in | Wt 193.4 lb

## 2014-08-20 DIAGNOSIS — E7800 Pure hypercholesterolemia, unspecified: Secondary | ICD-10-CM

## 2014-08-20 DIAGNOSIS — I2510 Atherosclerotic heart disease of native coronary artery without angina pectoris: Secondary | ICD-10-CM

## 2014-08-20 DIAGNOSIS — F17200 Nicotine dependence, unspecified, uncomplicated: Secondary | ICD-10-CM

## 2014-08-20 DIAGNOSIS — I10 Essential (primary) hypertension: Secondary | ICD-10-CM

## 2014-08-20 DIAGNOSIS — Z720 Tobacco use: Secondary | ICD-10-CM

## 2014-08-20 MED ORDER — BUPROPION HCL ER (SR) 150 MG PO TB12: ORAL_TABLET | ORAL | Status: DC

## 2014-08-20 MED ORDER — ROSUVASTATIN CALCIUM 40 MG PO TABS: 40.0000 mg | ORAL_TABLET | Freq: Every day | ORAL | Status: DC

## 2014-08-20 NOTE — Patient Instructions (Addendum)
Stop atorvastatin (lipitor).  Start Crestor 40mg  daily.Marland Kitchen  Use welbutrin to help you stop smoking.  Take welbutrin 150mg  daily for 3 days then increase to 150mg  two times daily-stay on this dose. DO NOT TAKE welbutrin if you are taking Celexa.  Your physician recommends that you return for a FASTING lipid profile /liver profile in 2 months.  Your physician wants you to follow-up in: 6 months with Dr Aundra Dubin. (June 2016). You will receive a reminder letter in the mail two months in advance. If you don't receive a letter, please call our office to schedule the follow-up appointment.

## 2014-08-21 NOTE — Progress Notes (Signed)
Patient ID: Karen Osborne, female   DOB: 1955-12-06, 58 y.o.   MRN: [id number] PCP: Dr. Danise Mina  58 yo with history of smoking and CAD s/p 5/13 NSTEMI and Promus DES to mLAD presents for cardiology followup.  ETT-Cardiolite in 7/15 showed small fixed apical defect with no ischemia. Currently, she has only rare atypical chest pain.  Minimal dyspnea, only if she walks a long distance carrying a load.  She is still smoking but < 1 ppd .   Labs (6/13): LDL 51, HDL 31, K 4.2, creatinine 0.8 Labs (7/15): LDL 138, K 3.4, creatinine 0.7 Labs (11/15): LDL 116  PMH: 1. Restless leg syndrome 2. Impaired fasting glucose 3. Depression 4. HTN 5. Hyperlipidemia 6. Active smoker 7. CAD: NSTEMI 5/13.  LHC showed 99% mLAD, 50% mRCA, EF 60% with apical hypokinesis.  Promus DES to mLAD.  Echo (5/13) with EF 55%, mild LVH, severe hypokinesis of the apical septum and true apex, grade II diastolic dysfunction.  ETT-Cardiolite (7/15) with fixed small apical defect and no ischemia, EF 64%.    SH: Married with 1 child, works in Press photographer for [employer]. Smokes about 3/4 ppd.   FH: Father with MI at 91.   ROS: All systems reviewed and negative except as per HPI.   Current Outpatient Prescriptions  Medication Sig Dispense Refill  . carvedilol (COREG) 6.25 MG tablet TAKE 1 TABLET BY MOUTH TWICE DAILY WITH A MEAL 60 tablet 2  . clopidogrel (PLAVIX) 75 MG tablet TAKE 1 TABLET BY MOUTH DAILY *MUST KEEP NEXT APPOINTMENT FOR FURTHER REFILLS* 30 tablet 2  . diphenhydramine-acetaminophen (TYLENOL PM EXTRA STRENGTH) 25-500 MG TABS Take 2 tablets by mouth at bedtime.     Marland Kitchen lisinopril (PRINIVIL,ZESTRIL) 20 MG tablet TAKE 1 TABLET BY MOUTH EVERY DAY 30 tablet 1  . nitroGLYCERIN (NITROSTAT) 0.4 MG SL tablet Place 1 tablet (0.4 mg total) under the tongue every 5 (five) minutes as needed for chest pain (up to 3 doses). 25 tablet 4  . rOPINIRole (REQUIP) 1 MG tablet TAKE 1-2 TABLETS BY MOUTH AT BEDTIME 60 tablet 6  . aspirin  EC 81 MG tablet Take 1 tablet (81 mg total) by mouth daily.    Marland Kitchen buPROPion (WELLBUTRIN SR) 150 MG 12 hr tablet 1 tablet by mouth daily  for 3 days, then 1 tablet  by mouth two times a day 60 tablet 6  . rosuvastatin (CRESTOR) 40 MG tablet Take 1 tablet (40 mg total) by mouth daily. 30 tablet 3   No current facility-administered medications for this visit.    BP 110/80 mmHg  Pulse 76  Ht 5\' 6"  (1.676 m)  Wt 193 lb 6.4 oz (87.726 kg)  BMI 31.23 kg/m2 General: NAD Neck: No JVD, no thyromegaly or thyroid nodule.  Lungs: Clear to auscultation bilaterally with normal respiratory effort. CV: Nondisplaced PMI.  Heart regular S1/S2, no S3/S4, no murmur.  No peripheral edema.  No carotid bruit.  Normal pedal pulses.  Abdomen: Soft, nontender, no hepatosplenomegaly, no distention.  Neurologic: Alert and oriented x 3.  Psych: Normal affect. Extremities: No clubbing or cyanosis.   Assessment/Plan: 1. CAD: No further ischemia symptoms, no ischemia on 7/15 Cardiolite.  I will continue her on ASA 81, Coreg, statin, lisinopril.  She has had no problems with bleeding so will continue Plavix for now.  2. Smoking: She continues to smoke. I encouraged her to quit.  She is interested in trying Wellbutrin.  I will prescribe this to her again.  She  does not think that she gave it enough of a chance last time she tried it. 3. HTN: BP controlled.  4. Hyperlipidemia: Lipids remain above goal, she reports compliance with atorvastatin.  I will have her stop this and start Crestor 40 mg daily with lipids/LFTs in 2 months.    Loralie Champagne 08/21/2014

## 2014-08-23 ENCOUNTER — Encounter (HOSPITAL_COMMUNITY): Payer: Self-pay | Admitting: Cardiovascular Disease

## 2014-08-30 ENCOUNTER — Telehealth: Payer: Self-pay | Admitting: *Deleted

## 2014-08-30 NOTE — Telephone Encounter (Signed)
PA for Crestor sent via CoverMyMeds 

## 2014-09-07 ENCOUNTER — Other Ambulatory Visit: Payer: Self-pay | Admitting: Cardiology

## 2014-09-14 DIAGNOSIS — M8580 Other specified disorders of bone density and structure, unspecified site: Secondary | ICD-10-CM

## 2014-09-14 HISTORY — DX: Other specified disorders of bone density and structure, unspecified site: M85.80

## 2014-09-19 NOTE — Telephone Encounter (Signed)
PA for Crestor not required per insurance company ([employer]).

## 2014-10-06 ENCOUNTER — Other Ambulatory Visit: Payer: Self-pay | Admitting: Cardiology

## 2014-10-15 DIAGNOSIS — M810 Age-related osteoporosis without current pathological fracture: Secondary | ICD-10-CM

## 2014-10-15 HISTORY — DX: Age-related osteoporosis without current pathological fracture: M81.0

## 2014-10-22 ENCOUNTER — Other Ambulatory Visit (INDEPENDENT_AMBULATORY_CARE_PROVIDER_SITE_OTHER): Payer: BLUE CROSS/BLUE SHIELD | Admitting: *Deleted

## 2014-10-22 DIAGNOSIS — I10 Essential (primary) hypertension: Secondary | ICD-10-CM

## 2014-10-22 DIAGNOSIS — E7800 Pure hypercholesterolemia, unspecified: Secondary | ICD-10-CM

## 2014-10-22 LAB — LIPID PANEL
Cholesterol: 144 mg/dL (ref 0–200)
HDL: 31.6 mg/dL — ABNORMAL LOW (ref >39.00)
LDL Cholesterol: 89 mg/dL (ref 0–99)
NonHDL: 112.4
Total CHOL/HDL Ratio: 5
Triglycerides: 117 mg/dL (ref 0.0–149.0)
VLDL: 23.4 mg/dL (ref 0.0–40.0)

## 2014-10-22 LAB — HEPATIC FUNCTION PANEL
ALT: 16 U/L (ref 0–35)
AST: 15 U/L (ref 0–37)
Albumin: 3.9 g/dL (ref 3.5–5.2)
Alkaline Phosphatase: 112 U/L (ref 39–117)
Bilirubin, Direct: 0.1 mg/dL (ref 0.0–0.3)
Total Bilirubin: 0.5 mg/dL (ref 0.2–1.2)
Total Protein: 6.5 g/dL (ref 6.0–8.3)

## 2014-11-11 ENCOUNTER — Other Ambulatory Visit: Payer: Self-pay | Admitting: Cardiology

## 2014-11-23 ENCOUNTER — Other Ambulatory Visit: Payer: Self-pay | Admitting: Cardiology

## 2014-12-19 ENCOUNTER — Other Ambulatory Visit: Payer: Self-pay | Admitting: Cardiology

## 2014-12-19 ENCOUNTER — Other Ambulatory Visit: Payer: Self-pay | Admitting: Family Medicine

## 2014-12-19 DIAGNOSIS — E7800 Pure hypercholesterolemia, unspecified: Secondary | ICD-10-CM

## 2014-12-19 NOTE — Telephone Encounter (Signed)
Per last ov, patient was to switch to crestor. Patient has been filling the atorvastatin. Which should she be on? I do not see where she was told to go back to the atorvastatin. Please advise. Thanks, MI

## 2014-12-19 NOTE — Telephone Encounter (Signed)
Ok to refill 

## 2014-12-20 NOTE — Telephone Encounter (Signed)
Would you call her and find out what is going on with her cholesterol medication, then forward to Dr Aundra Dubin for review? Thanks.

## 2014-12-21 NOTE — Telephone Encounter (Signed)
Spoke with patient and she states that she never started the crestor and has continued on the atorvastatin. She thought that the crestor was never approved through her insurance company, but per the notes a PA was submitted, but was not required. Ok to refill atorvastatin and discuss at next ov? Please advise. Thanks, MI

## 2014-12-24 NOTE — Telephone Encounter (Signed)
Spoke with patient and she is aware of Dr Oleh Genin recommendation . I transferred her to the scheduling department to set up lab appointment.

## 2014-12-24 NOTE — Telephone Encounter (Signed)
I will forward to Dr Aundra Dubin for review.   Please see note about atorvastatin.

## 2014-12-24 NOTE — Addendum Note (Signed)
Addended by: Katrine Coho on: 12/24/2014 10:44 AM   Modules accepted: Orders

## 2015-01-16 ENCOUNTER — Other Ambulatory Visit: Payer: Self-pay | Admitting: Cardiology

## 2015-01-24 ENCOUNTER — Other Ambulatory Visit: Payer: Self-pay | Admitting: Cardiology

## 2015-01-27 ENCOUNTER — Other Ambulatory Visit: Payer: Self-pay | Admitting: Cardiology

## 2015-01-28 ENCOUNTER — Other Ambulatory Visit: Payer: Self-pay

## 2015-01-28 MED ORDER — CLOPIDOGREL BISULFATE 75 MG PO TABS: 75.0000 mg | ORAL_TABLET | Freq: Once | ORAL | Status: DC

## 2015-01-28 MED ORDER — LISINOPRIL 20 MG PO TABS: 20.0000 mg | ORAL_TABLET | Freq: Every day | ORAL | Status: DC

## 2015-01-29 ENCOUNTER — Other Ambulatory Visit: Payer: Self-pay

## 2015-02-18 ENCOUNTER — Other Ambulatory Visit (INDEPENDENT_AMBULATORY_CARE_PROVIDER_SITE_OTHER): Payer: BLUE CROSS/BLUE SHIELD | Admitting: *Deleted

## 2015-02-18 DIAGNOSIS — E7800 Pure hypercholesterolemia, unspecified: Secondary | ICD-10-CM

## 2015-02-18 LAB — HEPATIC FUNCTION PANEL
ALT: 17 U/L (ref 0–35)
AST: 15 U/L (ref 0–37)
Albumin: 3.9 g/dL (ref 3.5–5.2)
Alkaline Phosphatase: 117 U/L (ref 39–117)
Bilirubin, Direct: 0.1 mg/dL (ref 0.0–0.3)
Total Bilirubin: 0.5 mg/dL (ref 0.2–1.2)
Total Protein: 6.7 g/dL (ref 6.0–8.3)

## 2015-02-18 LAB — LIPID PANEL
Cholesterol: 144 mg/dL (ref 0–200)
HDL: 33.7 mg/dL — ABNORMAL LOW (ref >39.00)
LDL Cholesterol: 88 mg/dL (ref 0–99)
NonHDL: 110.3
Total CHOL/HDL Ratio: 4
Triglycerides: 112 mg/dL (ref 0.0–149.0)
VLDL: 22.4 mg/dL (ref 0.0–40.0)

## 2015-02-22 ENCOUNTER — Telehealth: Payer: Self-pay | Admitting: Cardiology

## 2015-02-22 NOTE — Telephone Encounter (Signed)
New message ° ° ° ° ° °Returning a nurses call °

## 2015-02-22 NOTE — Telephone Encounter (Signed)
Calling stating that message was left for her regarding labs yesterday but Karen Osborne spoke with her and gave her the lab results. She didn't realize that it was related.

## 2015-04-16 ENCOUNTER — Other Ambulatory Visit: Payer: Self-pay | Admitting: Cardiology

## 2015-05-08 ENCOUNTER — Other Ambulatory Visit: Payer: Self-pay | Admitting: Cardiology

## 2015-05-29 ENCOUNTER — Encounter: Payer: Self-pay | Admitting: Physician Assistant

## 2015-05-29 ENCOUNTER — Ambulatory Visit (INDEPENDENT_AMBULATORY_CARE_PROVIDER_SITE_OTHER): Payer: BLUE CROSS/BLUE SHIELD | Admitting: Physician Assistant

## 2015-05-29 VITALS — BP 178/80 | HR 76 | Ht 66.0 in | Wt 199.8 lb

## 2015-05-29 DIAGNOSIS — E7800 Pure hypercholesterolemia, unspecified: Secondary | ICD-10-CM

## 2015-05-29 DIAGNOSIS — I2510 Atherosclerotic heart disease of native coronary artery without angina pectoris: Secondary | ICD-10-CM

## 2015-05-29 DIAGNOSIS — I10 Essential (primary) hypertension: Secondary | ICD-10-CM | POA: Diagnosis not present

## 2015-05-29 DIAGNOSIS — Z720 Tobacco use: Secondary | ICD-10-CM | POA: Diagnosis not present

## 2015-05-29 MED ORDER — CLOPIDOGREL BISULFATE 75 MG PO TABS: 75.0000 mg | ORAL_TABLET | Freq: Once | ORAL | Status: DC

## 2015-05-29 MED ORDER — LISINOPRIL 20 MG PO TABS: 20.0000 mg | ORAL_TABLET | Freq: Every day | ORAL | Status: DC

## 2015-05-29 MED ORDER — NITROGLYCERIN 0.4 MG SL SUBL: 0.4000 mg | SUBLINGUAL_TABLET | SUBLINGUAL | Status: AC | PRN

## 2015-05-29 MED ORDER — BUPROPION HCL ER (SR) 150 MG PO TB12: ORAL_TABLET | ORAL | Status: DC

## 2015-05-29 NOTE — Patient Instructions (Signed)
Medication Instructions:  1. REFILLS WERE SENT IN FOR NTG, PLAVIX, LISINOPRIL AND WELLBURTIN  Labwork: NONE  Testing/Procedures: NONE  Follow-Up: Your physician wants you to follow-up in: [place]. Aundra Dubin. You will receive a reminder letter in the mail two months in advance. If you don't receive a letter, please call our office to schedule the follow-up appointment.   Any Other Special Instructions Will Be Listed Below (If Applicable). IF YOU DECIDE TO STOP WELLBUTRIN THEN YOU WILL NEED TO TAPER OFF SLOWLY

## 2015-05-29 NOTE — Progress Notes (Signed)
Cardiology Office Note   Date:  05/29/2015   ID:  Karen Osborne, DOB 10-May-1956, MRN [id number]  PCP:  [name], MD  Cardiologist:  Dr. Loralie Champagne   Electrophysiologist:  n/a  No chief complaint on file.    History of Present Illness: Karen Osborne is a 59 y.o. female with a hx of CAD s/p NSTEMI 01/2012 tx with DES to mid LAD, HTN, HL, tobacco abuse.  Last seen by Dr. Loralie Champagne 12/15.  Wellbutrin was Rx'd to help with smoking cessation.  Returns for FU.  Overall doing well.  She works in Teacher, music.  She has a BP cuff at work and has noted her BP is usually 120-130s.  She denies chest pain, syncope, dyspnea, orthopnea, PND, edema. She is still smoking. She never received a Rx for Wellbutrin and still wants to try it.  She smokes 1 ppd.      Studies/Reports Reviewed Today:   - Myoview (7/15):  Fixed apical defect (breast attenuation versus scar), no ischemia, EF 64%; low risk - LHC (01/27/12): prox LAD 20%, mid LAD 99%, prox OM 30%, prox RCA 30%, mid RCA 50%, dist RCA 30%, EF 60%. PCI: 2.25 x 16 mm Promus Element DES to mid LAD.  - Echo (01/28/12): Mild LVH, EF 55%, apical septum and true apex severe HK, Gr 2 DD, mild LAE, normal RVF   Past Medical History  Diagnosis Date  . Hypertension   . Tobacco abuse   . Hypercholesteremia   . Restless leg syndrome   . NSVD (normal spontaneous vaginal delivery) 1988  . Osteopenia   . Depression   . Coronary artery disease     completed cards rehab [facility] (07/2012); s/p NSTEMI - LHC 01/27/12: Proximal LAD 20%, mid LAD 99%, proximal RCA 30%, mid RCA 50%, distal RCA 30%, EF 60%, apical HK, mild to moderate MR.  PCI: Promus DES to the mid LAD.  Echocardiogram 01/28/12: Mild LVH, EF 55%, severe HK of the apical septum and true apex, grade 2 diastolic dysfunction, trivial MR, mild LAE  . Glucose intolerance (impaired glucose tolerance)     A1C 6.1 01/2012  . Hx of cardiovascular stress test 03/2014   ETT-Myoview (03/2014): Fixed apical defect (breast attenuation versus scar), no ischemia, EF 64%; low risk  1. Restless leg syndrome 2. Impaired fasting glucose 3. Depression 4. HTN 5. Hyperlipidemia 6. Active smoker 7. CAD: NSTEMI 5/13. LHC showed 99% mLAD, 50% mRCA, EF 60% with apical hypokinesis. Promus DES to mLAD. Echo (5/13) with EF 55%, mild LVH, severe hypokinesis of the apical septum and true apex, grade II diastolic dysfunction. ETT-Cardiolite (7/15) with fixed small apical defect and no ischemia, EF 64%.    Past Surgical History  Procedure Laterality Date  . Gyn surgery  02/1998    total hysterectomy, abn pap smear, no cancer  . Abdominal hysterectomy    . Coronary angioplasty with stent placement  01/27/2012    DES to LAD  . Left heart catheterization with coronary angiogram N/A 01/27/2012    Procedure: LEFT HEART CATHETERIZATION WITH CORONARY ANGIOGRAM;  Surgeon: Burnell Blanks, MD;  Location: [facility] CATH LAB;  Service: Cardiovascular;  Laterality: N/A;  . Percutaneous coronary stent intervention (pci-s)  01/27/2012    Procedure: PERCUTANEOUS CORONARY STENT INTERVENTION (PCI-S);  Surgeon: Burnell Blanks, MD;  Location: [facility] CATH LAB;  Service: Cardiovascular;;     Current Outpatient Prescriptions  Medication Sig Dispense Refill  . aspirin EC 81  MG tablet Take 1 tablet (81 mg total) by mouth daily.    Marland Kitchen atorvastatin (LIPITOR) 80 MG tablet TAKE 1 TABLET BY MOUTH AT BEDTIME 30 tablet 2  . buPROPion (WELLBUTRIN SR) 150 MG 12 hr tablet 1 tablet by mouth daily  for 3 days, then 1 tablet  by mouth two times a day 60 tablet 6  . carvedilol (COREG) 6.25 MG tablet TAKE 1 TABLET BY MOUTH TWICE DAILY WITH A MEAL 60 tablet 0  . clopidogrel (PLAVIX) 75 MG tablet Take 1 tablet (75 mg total) by mouth once. 30 tablet 1  . diphenhydramine-acetaminophen (TYLENOL PM EXTRA STRENGTH) 25-500 MG TABS Take 2 tablets by mouth at bedtime.     Marland Kitchen lisinopril (PRINIVIL,ZESTRIL) 20 MG tablet  Take 1 tablet (20 mg total) by mouth daily. 30 tablet 1  . rOPINIRole (REQUIP) 1 MG tablet TAKE 1-2 TABLETS BY MOUTH AT BEDTIME 60 tablet 6  . nitroGLYCERIN (NITROSTAT) 0.4 MG SL tablet Place 1 tablet (0.4 mg total) under the tongue every 5 (five) minutes as needed for chest pain (up to 3 doses). 25 tablet 3   No current facility-administered medications for this visit.    Allergies:   Review of patient's allergies indicates no known allergies.    Social History:  The patient  reports that she has been smoking Cigarettes.  She has a 19 pack-year smoking history. She has never used smokeless tobacco. She reports that she does not drink alcohol or use illicit drugs.   Family History:  The patient's family history includes Diabetes in her sister; Heart attack in her father; Heart disease (age of onset: 42) in her father; Hypertension in her father; Prostate cancer in her father.    ROS:   Please see the history of present illness.   Review of Systems  Respiratory: Positive for cough.   Psychiatric/Behavioral: Positive for depression. The patient is nervous/anxious.   All other systems reviewed and are negative.     PHYSICAL EXAM: VS:  BP 178/80 mmHg  Pulse 76  Ht 5\' 6"  (1.676 m)  Wt 199 lb 12.8 oz (90.629 kg)  BMI 32.26 kg/m2    Wt Readings from Last 3 Encounters:  05/29/15 199 lb 12.8 oz (90.629 kg)  08/20/14 193 lb 6.4 oz (87.726 kg)  03/21/14 194 lb (87.998 kg)     GEN: Well nourished, well developed, in no acute distress HEENT: normal Neck: no JVD, no carotid bruits, no masses Cardiac:  Normal S1/S2, RRR; no murmur ,  no rubs or gallops, no edema   Respiratory:  clear to auscultation bilaterally, no wheezing, rhonchi or rales. GI: soft, nontender, nondistended, + BS MS: no deformity or atrophy Skin: warm and dry  Neuro:  CNs II-XII intact, Strength and sensation are intact Psych: Normal affect   EKG:  EKG is ordered today.  It demonstrates:   NSR, HR 76, no ST  changes.   Recent Labs: 02/18/2015: ALT 17    Lipid Panel    Component Value Date/Time   CHOL 144 02/18/2015 0746   TRIG 112.0 02/18/2015 0746   HDL 33.70* 02/18/2015 0746   CHOLHDL 4 02/18/2015 0746   VLDL 22.4 02/18/2015 0746   LDLCALC 88 02/18/2015 0746      ASSESSMENT AND PLAN:    CAD:  S/p NSTEMI 5/13 with DES to LAD.  Myoview in 7/15 was low risk.  She is doing well without angina.  Continue aspirin, Plavix, statin, ACE inhibitor, beta blocker.  HTN:  Blood pressure  elevated today. She checks her blood pressure often at work and is usually optimal. I have asked her to keep an eye on her blood pressures and notify us if they are running high.  Hyperlipidemia:  Continue statin.  Recent LDL 88.  Her insurance would not cover Crestor.  Tobacco Abuse:  I spent 5 minutes discussing tobacco cessation with the patient.  I will give her a prescription for Wellbutrin SR 150 mg daily 3 days, then increase to twice daily. We discussed the importance of setting a quit date. We also discussed the importance of tapering off of Wellbutrin when she is ready to come off of that medication. I have advised her to continue Wellbutrin for at least 3-4 months after stopping smoking.     Medication Changes: Current medicines are reviewed at length with the patient today.  Concerns regarding medicines are as outlined above.  The following changes have been made:   Discontinued Medications   ATORVASTATIN (LIPITOR) 80 MG TABLET    TAKE 1 TABLET BY MOUTH AT BEDTIME   ATORVASTATIN (LIPITOR) 80 MG TABLET    TAKE 1 TABLET BY MOUTH AT BEDTIME   CARVEDILOL (COREG) 6.25 MG TABLET    TAKE 1 TABLET BY MOUTH TWICE DAILY WITH A MEAL   CLOPIDOGREL (PLAVIX) 75 MG TABLET    TAKE 1 TABLET BY MOUTH DAILY *MUST KEEP NEXT APPOINTMENT FOR FURTHER REFILLS*   ROSUVASTATIN (CRESTOR) 40 MG TABLET    Take 1 tablet (40 mg total) by mouth daily.   Modified Medications   Modified Medication Previous Medication    BUPROPION (WELLBUTRIN SR) 150 MG 12 HR TABLET buPROPion (WELLBUTRIN SR) 150 MG 12 hr tablet      1 tablet by mouth daily  for 3 days, then 1 tablet  by mouth two times a day    1 tablet by mouth daily  for 3 days, then 1 tablet  by mouth two times a day   CLOPIDOGREL (PLAVIX) 75 MG TABLET clopidogrel (PLAVIX) 75 MG tablet      Take 1 tablet (75 mg total) by mouth once.    TAKE 1 TABLET (75 MG TOTAL) BY MOUTH ONCE.   LISINOPRIL (PRINIVIL,ZESTRIL) 20 MG TABLET lisinopril (PRINIVIL,ZESTRIL) 20 MG tablet      Take 1 tablet (20 mg total) by mouth daily.    TAKE 1 TABLET (20 MG TOTAL) BY MOUTH DAILY.   NITROGLYCERIN (NITROSTAT) 0.4 MG SL TABLET nitroGLYCERIN (NITROSTAT) 0.4 MG SL tablet      Place 1 tablet (0.4 mg total) under the tongue every 5 (five) minutes as needed for chest pain (up to 3 doses).    Place 1 tablet (0.4 mg total) under the tongue every 5 (five) minutes as needed for chest pain (up to 3 doses).   New Prescriptions   No medications on file    Labs/ tests ordered today include:   Orders Placed This Encounter  Procedures  . EKG 12-Lead      Disposition:    FU with Dr. Loralie Champagne 6 mos.    Signed, Versie Starks, MHS 05/29/2015 4:37 PM    [facility] [address], [place], [place]  [address] Phone: [phone]; Fax: [phone]

## 2015-05-30 ENCOUNTER — Other Ambulatory Visit: Payer: Self-pay | Admitting: Cardiology

## 2015-06-25 ENCOUNTER — Other Ambulatory Visit: Payer: Self-pay | Admitting: Cardiology

## 2015-06-30 ENCOUNTER — Other Ambulatory Visit: Payer: Self-pay | Admitting: Cardiology

## 2015-07-15 ENCOUNTER — Other Ambulatory Visit: Payer: Self-pay | Admitting: Cardiology

## 2015-07-22 ENCOUNTER — Other Ambulatory Visit: Payer: Self-pay | Admitting: Cardiology

## 2015-07-24 LAB — HM MAMMOGRAPHY

## 2015-08-09 ENCOUNTER — Other Ambulatory Visit: Payer: Self-pay | Admitting: Cardiology

## 2015-08-15 DIAGNOSIS — Z1371 Encounter for nonprocreative screening for genetic disease carrier status: Secondary | ICD-10-CM

## 2015-08-15 HISTORY — DX: Encounter for nonprocreative screening for genetic disease carrier status: Z13.71

## 2015-08-17 ENCOUNTER — Encounter: Payer: Self-pay | Admitting: Family Medicine

## 2016-01-27 ENCOUNTER — Other Ambulatory Visit: Payer: Self-pay | Admitting: Family Medicine

## 2016-01-27 ENCOUNTER — Other Ambulatory Visit: Payer: Self-pay | Admitting: Cardiology

## 2016-01-28 NOTE — Telephone Encounter (Signed)
Ok to refill 

## 2016-02-27 NOTE — Progress Notes (Signed)
Cardiology Office Note:    Date:  02/28/2016   ID:  Karen Osborne, DOB Mar 17, 1956, MRN [id number]  PCP:  [name], MD  Cardiologist:  Dr. Loralie Champagne   Electrophysiologist:  n/a  Referring MD: [name], MD   Chief Complaint  Patient presents with  . Coronary Artery Disease    Follow up    History of Present Illness:     Karen Osborne is a 60 y.o. female with a hx of CAD s/p NSTEMI 01/2012 tx with DES to mid LAD, HTN, HL, tobacco abuse.  Last seen in clinic by me in 9/16.    She returns for FU on CAD.  Here alone.  She is doing well.  The patient denies chest pain, shortness of breath, syncope, orthopnea, PND or significant pedal edema.    Past Medical History  Diagnosis Date  . Hypertension   . Tobacco abuse   . Hypercholesteremia   . Restless leg syndrome   . NSVD (normal spontaneous vaginal delivery) 1988  . Osteopenia   . Depression   . Coronary artery disease     a. s/p NSTEMI - LHC 01/27/12: prox LAD 20%, mid LAD 99%, prox OM 30%, prox RCA 30%, mid RCA 50%, dist RCA 30%, EF 60%. PCI: 2.25 x 16 mm Promus Element DES to mid LAD //  b. Myoview (7/15): Fixed apical defect (breast attenuation versus scar), no ischemia, EF 64%; low risk   . Glucose intolerance (impaired glucose tolerance)     A1C 6.1 01/2012  . History of echocardiogram     Echo (01/28/12): Mild LVH, EF 55%, apical septum and true apex severe HK, Gr 2 DD, mild LAE, normal RVF    Past Surgical History  Procedure Laterality Date  . Gyn surgery  02/1998    total hysterectomy, abn pap smear, no cancer  . Abdominal hysterectomy    . Coronary angioplasty with stent placement  01/27/2012    DES to LAD  . Left heart catheterization with coronary angiogram N/A 01/27/2012    Procedure: LEFT HEART CATHETERIZATION WITH CORONARY ANGIOGRAM;  Surgeon: Burnell Blanks, MD;  Location: [facility] CATH LAB;  Service: Cardiovascular;  Laterality: N/A;  . Percutaneous coronary stent intervention (pci-s)   01/27/2012    Procedure: PERCUTANEOUS CORONARY STENT INTERVENTION (PCI-S);  Surgeon: Burnell Blanks, MD;  Location: [facility] CATH LAB;  Service: Cardiovascular;;    Current Medications: Outpatient Prescriptions Prior to Visit  Medication Sig Dispense Refill  . atorvastatin (LIPITOR) 80 MG tablet TAKE 1 TABLET BY MOUTH AT BEDTIME 30 tablet 2  . buPROPion (WELLBUTRIN SR) 150 MG 12 hr tablet 1 tablet by mouth daily  for 3 days, then 1 tablet  by mouth two times a day 60 tablet 6  . clopidogrel (PLAVIX) 75 MG tablet Take 1 tablet (75 mg total) by mouth daily. 30 tablet 6  . diphenhydramine-acetaminophen (TYLENOL PM EXTRA STRENGTH) 25-500 MG TABS Take 2 tablets by mouth at bedtime.     Marland Kitchen lisinopril (PRINIVIL,ZESTRIL) 20 MG tablet Take 1 tablet (20 mg total) by mouth daily. 30 tablet 1  . nitroGLYCERIN (NITROSTAT) 0.4 MG SL tablet Place 1 tablet (0.4 mg total) under the tongue every 5 (five) minutes as needed for chest pain (up to 3 doses). 25 tablet 3  . rOPINIRole (REQUIP) 1 MG tablet TAKE 1-2 TABLETS BY MOUTH AT BEDTIME 60 tablet 6  . aspirin EC 81 MG tablet Take 1 tablet (81 mg total) by mouth daily.    Marland Kitchen  carvedilol (COREG) 6.25 MG tablet TAKE 1 TABLET BY MOUTH TWICE DAILY WITH A MEAL 60 tablet 11  . lisinopril (PRINIVIL,ZESTRIL) 20 MG tablet TAKE 1 TABLET (20 MG TOTAL) BY MOUTH DAILY. 30 tablet 5  . atorvastatin (LIPITOR) 80 MG tablet TAKE 1 TABLET BY MOUTH AT BEDTIME (Patient not taking: Reported on 02/28/2016) 30 tablet 9  . clopidogrel (PLAVIX) 75 MG tablet Take 1 tablet (75 mg total) by mouth once. (Patient not taking: Reported on 02/28/2016) 30 tablet 1  . clopidogrel (PLAVIX) 75 MG tablet Take 1 tablet (75 mg total) by mouth daily. Please call and schedule an appointment (Patient not taking: Reported on 02/28/2016) 30 tablet 2   No facility-administered medications prior to visit.      Allergies:   Review of patient's allergies indicates no known allergies.   Social History   Social  History  . Marital Status: Married    Spouse Name: N/A  . Number of Children: 1  . Years of Education: N/A   Occupational History  . 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 Smoking status: Current Every Day Smoker -- 0.50 packs/day for 38 years    Types: Cigarettes  . Smokeless tobacco: Never Used  . Alcohol Use: No  . Drug Use: No  . Sexual Activity: Yes   Other Topics Concern  . None   Social History Narrative   Works in Press photographer (Designer, television/film set and valves)     Family History:  The patient's family history includes Diabetes in her sister; Heart attack in her father; Heart disease (age of onset: 13) in her father; Hypertension in her father; Prostate cancer in her father.   ROS:   Please see the history of present illness.    Review of Systems  Constitution: Positive for diaphoresis and malaise/fatigue.  Cardiovascular: Positive for leg swelling.  Respiratory: Positive for cough.   Musculoskeletal: Positive for joint pain.  Psychiatric/Behavioral: Positive for depression. The patient is nervous/anxious.    All other systems reviewed and are negative.   Physical Exam:    VS:  BP 140/70 mmHg  Pulse 62  Ht 5\' 7"  (1.702 m)  Wt 197 lb 1.9 oz (89.413 kg)  BMI 30.87 kg/m2   Physical Exam  Constitutional: She is oriented to person, place, and time. She appears well-developed and well-nourished.  HENT:  Head: Normocephalic and atraumatic.  Neck: Normal range of motion. No JVD present. Carotid bruit is not present.  Cardiovascular: Regular rhythm, S1 normal and S2 normal.   No murmur heard. Pulmonary/Chest: Breath sounds normal. She has no wheezes. She has no rhonchi. She has no rales.  Abdominal: Soft. There is no tenderness.  Musculoskeletal:  Trace ankle edema  Neurological: She is alert and oriented to person, place, and time.  Skin: Skin is warm and dry.  Psychiatric: She has a normal mood and affect.    Wt  Readings from Last 3 Encounters:  02/28/16 197 lb 1.9 oz (89.413 kg)  05/29/15 199 lb 12.8 oz (90.629 kg)  08/20/14 193 lb 6.4 oz (87.726 kg)      Studies/Labs Reviewed:     EKG:  EKG is  ordered today.  The ekg ordered today demonstrates NSR, HR 69, QTc 409 ms, no changes   Recent Labs: No results found for requested labs within last 365 days.   Recent Lipid Panel    Component Value Date/Time   CHOL 144 02/18/2015 0746  TRIG 112.0 02/18/2015 0746   HDL 33.70* 02/18/2015 0746   CHOLHDL 4 02/18/2015 0746   VLDL 22.4 02/18/2015 0746   LDLCALC 88 02/18/2015 0746    Additional studies/ records that were reviewed today include:   - Myoview (7/15): Fixed apical defect (breast attenuation versus scar), no ischemia, EF 64%; low risk - LHC (01/27/12): prox LAD 20%, mid LAD 99%, prox OM 30%, prox RCA 30%, mid RCA 50%, dist RCA 30%, EF 60%. PCI: 2.25 x 16 mm Promus Element DES to mid LAD.  - Echo (01/28/12): Mild LVH, EF 55%, apical septum and true apex severe HK, Gr 2 DD, mild LAE, normal RVF  ASSESSMENT:     1. Coronary artery disease involving native coronary artery of native heart without angina pectoris   2. Essential hypertension   3. HYPERCHOLESTEROLEMIA   4. TOBACCO ABUSE     PLAN:     In order of problems listed above:  1. CAD: S/p NSTEMI 5/13 with DES to LAD. Myoview in 7/15 was low risk. She is doing well without anginal symptoms. Continue statin, ACE inhibitor, beta blocker.  It has been > 4 years since her MI.  She does not really require dual antiplatelet Rx any longer.  DC ASA.  Continue Plavix 75 mg QD.    2. HTN: Borderline BP today.  Her BP at work is typically 130/80s.  Continue current regimen which includes Carvedilol, Lisinopril.    3. Hyperlipidemia: Her LDL was 88 in 6/16.  Arrange FU fasting CMET, Lipids.  If LDL > 70, consider referral to Lipid clinic for PCSK-9 inhibitor.   4. Tobacco Abuse:  We again discussed the importance of quitting  smoking.    Medication Adjustments/Labs and Tests Ordered: Current medicines are reviewed at length with the patient today.  Concerns regarding medicines are outlined above.  Medication changes, Labs and Tests ordered today are outlined in the Patient Instructions noted below. Patient Instructions  Medication Instructions:  You can stop taking Aspirin. Continue taking all other medications. Labwork: Fasting labs in 1 week - CMET, Lipids June 20 between 8-5 Testing/Procedures: None  Follow-Up: Dr. Loralie Champagne or Richardson Dopp, PA-C in 1 year.  Any Other Special Instructions Will Be Listed Below (If Applicable). If you need a refill on your cardiac medications before your next appointment, please call your pharmacy.    Signed, Richardson Dopp, PA-C  02/28/2016 8:48 AM    [facility] [address], [place], [place]  [address] Phone: [phone]; Fax: [phone]

## 2016-02-28 ENCOUNTER — Ambulatory Visit (INDEPENDENT_AMBULATORY_CARE_PROVIDER_SITE_OTHER): Payer: BLUE CROSS/BLUE SHIELD | Admitting: Physician Assistant

## 2016-02-28 ENCOUNTER — Encounter: Payer: Self-pay | Admitting: Physician Assistant

## 2016-02-28 VITALS — BP 140/70 | HR 62 | Ht 67.0 in | Wt 197.1 lb

## 2016-02-28 DIAGNOSIS — I2510 Atherosclerotic heart disease of native coronary artery without angina pectoris: Secondary | ICD-10-CM | POA: Diagnosis not present

## 2016-02-28 DIAGNOSIS — E7800 Pure hypercholesterolemia, unspecified: Secondary | ICD-10-CM | POA: Diagnosis not present

## 2016-02-28 DIAGNOSIS — F17200 Nicotine dependence, unspecified, uncomplicated: Secondary | ICD-10-CM

## 2016-02-28 DIAGNOSIS — I10 Essential (primary) hypertension: Secondary | ICD-10-CM

## 2016-02-28 MED ORDER — CARVEDILOL 6.25 MG PO TABS: ORAL_TABLET | ORAL | Status: DC

## 2016-02-28 NOTE — Patient Instructions (Addendum)
Medication Instructions:  You can stop taking Aspirin. Continue taking all other medications. Labwork: Fasting labs in 1 week - CMET, Lipids June 20 between 8-5 Testing/Procedures: None  Follow-Up: Dr. Loralie Champagne or Richardson Dopp, PA-C in 1 year.  Any Other Special Instructions Will Be Listed Below (If Applicable). If you need a refill on your cardiac medications before your next appointment, please call your pharmacy.

## 2016-03-03 ENCOUNTER — Other Ambulatory Visit: Payer: BLUE CROSS/BLUE SHIELD

## 2016-03-06 ENCOUNTER — Other Ambulatory Visit: Payer: BLUE CROSS/BLUE SHIELD

## 2016-03-08 ENCOUNTER — Other Ambulatory Visit: Payer: Self-pay | Admitting: Cardiology

## 2016-05-24 ENCOUNTER — Other Ambulatory Visit: Payer: Self-pay | Admitting: Cardiology

## 2016-05-28 ENCOUNTER — Ambulatory Visit (INDEPENDENT_AMBULATORY_CARE_PROVIDER_SITE_OTHER): Payer: BLUE CROSS/BLUE SHIELD | Admitting: Family Medicine

## 2016-05-28 ENCOUNTER — Encounter: Payer: Self-pay | Admitting: Family Medicine

## 2016-05-28 VITALS — BP 142/82 | HR 80 | Temp 98.7°F | Wt 197.2 lb

## 2016-05-28 DIAGNOSIS — M7712 Lateral epicondylitis, left elbow: Secondary | ICD-10-CM | POA: Diagnosis not present

## 2016-05-28 DIAGNOSIS — I10 Essential (primary) hypertension: Secondary | ICD-10-CM | POA: Diagnosis not present

## 2016-05-28 DIAGNOSIS — F17200 Nicotine dependence, unspecified, uncomplicated: Secondary | ICD-10-CM

## 2016-05-28 DIAGNOSIS — I2510 Atherosclerotic heart disease of native coronary artery without angina pectoris: Secondary | ICD-10-CM

## 2016-05-28 DIAGNOSIS — Z23 Encounter for immunization: Secondary | ICD-10-CM | POA: Diagnosis not present

## 2016-05-28 DIAGNOSIS — M25531 Pain in right wrist: Secondary | ICD-10-CM

## 2016-05-28 MED ORDER — DICLOFENAC SODIUM 1 % TD GEL: 1.0000 "application " | Freq: Three times a day (TID) | TRANSDERMAL | 1 refills | Status: DC

## 2016-05-28 MED ORDER — IBUPROFEN-DIPHENHYDRAMINE HCL 200-25 MG PO CAPS: 1.0000 | ORAL_CAPSULE | Freq: Every evening | ORAL | Status: DC | PRN

## 2016-05-28 NOTE — Assessment & Plan Note (Signed)
Anticipate R ventral wrist ganglion cyst. Discussed this.

## 2016-05-28 NOTE — Assessment & Plan Note (Addendum)
Discussed treatment with tennis elbow strap, voltaren gel, and exercises from [facility] pt advisor. Encouraged resting elbow. Update if not improving with treatment.

## 2016-05-28 NOTE — Progress Notes (Signed)
BP (!) 142/82   Pulse 80   Temp 98.7 F (37.1 C) (Oral)   Wt 197 lb 4 oz (89.5 kg)   BMI 30.89 kg/m    CC: elbow pain Subjective:    Patient ID: Karen Osborne, female    DOB: 03-24-1956, 60 y.o.   MRN: [id number]  HPI: Karen Osborne is a 60 y.o. female presenting on 05/28/2016 for Arm Pain (left arm/elbow swollen and painful) and Wrist Pain (right )   Last seen here 2013. NSTEMI s/p DES stent to LAD 01/2012. Regularly followed by cardiology. Only on plavix 75mg  daily. Continues smoking but trying to cut down 3/4-1 ppd.   Presents today with 1 mo h/o L lateral elbow pain/swelling. More pain with reaching and grabbing and heavy lifting. Denies inciting trauma/injury or falls. Hasn't tried anything for this.   Right handed.   Mild R dorsal wrist pain   Colonoscopy per patient report 2008 and stable [facility]).   Requests ropinirole refilled. Has found increased water intake helps as well.   Relevant past medical, surgical, family and social history reviewed and updated as indicated. Interim medical history since our last visit reviewed. Allergies and medications reviewed and updated. Current Outpatient Prescriptions on File Prior to Visit  Medication Sig  . atorvastatin (LIPITOR) 80 MG tablet TAKE 1 TABLET BY MOUTH AT BEDTIME  . carvedilol (COREG) 6.25 MG tablet TAKE 1 TABLET BY MOUTH TWICE DAILY WITH A MEAL  . clopidogrel (PLAVIX) 75 MG tablet Take 1 tablet (75 mg total) by mouth daily.  Marland Kitchen lisinopril (PRINIVIL,ZESTRIL) 20 MG tablet Take 1 tablet (20 mg total) by mouth daily.  . nitroGLYCERIN (NITROSTAT) 0.4 MG SL tablet Place 1 tablet (0.4 mg total) under the tongue every 5 (five) minutes as needed for chest pain (up to 3 doses).  Marland Kitchen rOPINIRole (REQUIP) 1 MG tablet TAKE 1-2 TABLETS BY MOUTH AT BEDTIME   No current facility-administered medications on file prior to visit.     Review of Systems Per HPI unless specifically indicated in ROS section     Objective:    BP  (!) 142/82   Pulse 80   Temp 98.7 F (37.1 C) (Oral)   Wt 197 lb 4 oz (89.5 kg)   BMI 30.89 kg/m   Wt Readings from Last 3 Encounters:  05/28/16 197 lb 4 oz (89.5 kg)  02/28/16 197 lb 1.9 oz (89.4 kg)  05/29/15 199 lb 12.8 oz (90.6 kg)    Physical Exam  Constitutional: She is oriented to person, place, and time. She appears well-developed and well-nourished. No distress.  Musculoskeletal: She exhibits edema (mild at L lateral epicondyle).  2+ rad pulses bilaterally, sensation intact R wrist with hard nodule ventrally, FROM at wrist without CMC or snuff box pain L elbow tender to palpation at lateral epicondyle, worse pain with forced supination against resistance as well as forced extension against resistance.  FROM at elbow   Neurological: She is alert and oriented to person, place, and time.  Neg tinel  Skin: Skin is warm and dry. No rash noted. No erythema.  Psychiatric: She has a normal mood and affect.  Nursing note and vitals reviewed.  Results for orders placed or performed in visit on 08/17/15  HM MAMMOGRAPHY  Result Value Ref Range   HM Mammogram birads 2       Assessment & Plan:   Problem List Items Addressed This Visit    CAD (coronary artery disease)    Asxs. Appreciate cards  care of patient. Continue current regimen.       Essential hypertension    Mildly elevated today. No changes today.      Left lateral epicondylitis - Primary    Discussed treatment with tennis elbow strap, voltaren gel, and exercises from [facility] pt advisor. Encouraged resting elbow. Update if not improving with treatment.      Right wrist pain    Anticipate R ventral wrist ganglion cyst. Discussed this.       TOBACCO ABUSE    Continue to encourage cessation. Contemplative        Other Visit Diagnoses    Need for influenza vaccination       Relevant Orders   Flu Vaccine QUAD 36+ mos PF IM (Fluarix & Fluzone Quad PF) (Completed)       Follow up plan: Return in about 3 months  (around 08/27/2016).  [name], MD

## 2016-05-28 NOTE — Assessment & Plan Note (Signed)
Continue to encourage cessation. Contemplative. 

## 2016-05-28 NOTE — Patient Instructions (Addendum)
Sign release up front for colonoscopy report 2008 [place]. You have right wrist ganglion cyst - let us know if worsening or enlarging for further treatment.  You have left tennis elbow. Avoid repetitive movements. Strap provided today. Exercises provided today. May use advil as needed for discomfort.  Keep working on cutting down on smoking.  Return for physical at your convenience.

## 2016-05-28 NOTE — Assessment & Plan Note (Addendum)
Mildly elevated today. No changes today.  

## 2016-05-28 NOTE — Assessment & Plan Note (Signed)
Asxs. Appreciate cards care of patient. Continue current regimen.

## 2016-06-06 ENCOUNTER — Encounter: Payer: Self-pay | Admitting: Family Medicine

## 2016-06-13 ENCOUNTER — Other Ambulatory Visit: Payer: Self-pay | Admitting: Cardiology

## 2016-06-23 ENCOUNTER — Other Ambulatory Visit: Payer: Self-pay

## 2016-06-23 DIAGNOSIS — I10 Essential (primary) hypertension: Secondary | ICD-10-CM

## 2016-06-23 MED ORDER — ATORVASTATIN CALCIUM 80 MG PO TABS: 80.0000 mg | ORAL_TABLET | Freq: Every day | ORAL | 2 refills | Status: DC

## 2016-06-23 MED ORDER — LISINOPRIL 20 MG PO TABS: 20.0000 mg | ORAL_TABLET | Freq: Every day | ORAL | 2 refills | Status: DC

## 2016-07-17 ENCOUNTER — Ambulatory Visit (INDEPENDENT_AMBULATORY_CARE_PROVIDER_SITE_OTHER): Payer: BLUE CROSS/BLUE SHIELD | Admitting: Internal Medicine

## 2016-07-17 ENCOUNTER — Encounter: Payer: Self-pay | Admitting: Internal Medicine

## 2016-07-17 VITALS — BP 140/84 | HR 79 | Temp 98.9°F | Wt 198.5 lb

## 2016-07-17 DIAGNOSIS — J069 Acute upper respiratory infection, unspecified: Secondary | ICD-10-CM

## 2016-07-17 DIAGNOSIS — B9789 Other viral agents as the cause of diseases classified elsewhere: Secondary | ICD-10-CM | POA: Diagnosis not present

## 2016-07-17 MED ORDER — HYDROCODONE-HOMATROPINE 5-1.5 MG/5ML PO SYRP: 5.0000 mL | ORAL_SOLUTION | Freq: Three times a day (TID) | ORAL | 0 refills | Status: DC | PRN

## 2016-07-17 NOTE — Progress Notes (Signed)
HPI  Pt presents to the clinic today with c/o runny nose, cough and chest congestion. This started 3 weeks ago. She is blowing clear mucous out of her nose. The cough is productive of clear mucous. She does have some exertional shortness of breath, but denies chest pain. She denies facial pain and pressure, ear pain or sore throat.She has no history of allergies or breathing problems. She denies fever, chills or body aches. She has had sick contacts and she does smoke.   Review of Systems      Past Medical History:  Diagnosis Date  . Coronary artery disease    a. s/p NSTEMI - LHC 01/27/12: prox LAD 20%, mid LAD 99%, prox OM 30%, prox RCA 30%, mid RCA 50%, dist RCA 30%, EF 60%. PCI: 2.25 x 16 mm Promus Element DES to mid LAD //  b. Myoview (7/15): Fixed apical defect (breast attenuation versus scar), no ischemia, EF 64%; low risk   . Depression   . Glucose intolerance (impaired glucose tolerance)    A1C 6.1 01/2012  . History of echocardiogram    Echo (01/28/12): Mild LVH, EF 55%, apical septum and true apex severe HK, Gr 2 DD, mild LAE, normal RVF  . Hypercholesteremia   . Hypertension   . NSVD (normal spontaneous vaginal delivery) 1988  . Osteopenia   . Restless leg syndrome   . Tobacco abuse     Family History  Problem Relation Age of Onset  . Prostate cancer Father   . Heart disease Father 6  . Hypertension Father   . Diabetes Sister   . Heart attack Father     Social History   Social History  . Marital status: Married    Spouse name: N/A  . Number of children: 1  . Years of education: N/A   Occupational History  . 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 Smoking status: Current Every Day Smoker    Packs/day: 0.50    Years: 38.00    Types: Cigarettes  . Smokeless tobacco: Never Used  . Alcohol use No  . Drug use: No  . Sexual activity: Yes   Other Topics Concern  . Not on file   Social History  Narrative   Works in Press photographer (Designer, television/film set and valves)    Allergies  Allergen Reactions  . Penicillins Rash     Constitutional:  Denies headache, fatigue, fever or abrupt weight changes.  HEENT:  Positive runny nose. Denies eye redness, eye pain, pressure behind the eyes, facial pain, nasal congestion, ear pain, ringing in the ears, wax buildup or sore throat. Respiratory: Positive cough or shortness of breath. Denies difficulty breathing.  Cardiovascular: Denies chest pain, chest tightness, palpitations or swelling in the hands or feet.   No other specific complaints in a complete review of systems (except as listed in HPI above).  Objective:   BP 140/84   Pulse 79   Temp 98.9 F (37.2 C) (Oral)   Wt 198 lb 8 oz (90 kg)   SpO2 98%   BMI 31.09 kg/m  Wt Readings from Last 3 Encounters:  07/17/16 198 lb 8 oz (90 kg)  05/28/16 197 lb 4 oz (89.5 kg)  02/28/16 197 lb 1.9 oz (89.4 kg)     General: Appears her stated age, obese in NAD. HEENT: Head: normal shape and size, no sinus tenderness; Eyes: sclera white, no icterus, conjunctiva pink; Right Ear: cerumen impaction.  Left Ear: Tm's gray and intact, normal light reflex; Throat/Mouth: + PND. Teeth present, mucosa pink and moist, no exudate noted, no lesions or ulcerations noted.  Neck: No cervical lymphadenopathy.   Pulmonary/Chest: Normal effort and positive vesicular breath sounds. No respiratory distress. No wheezes, rales or ronchi noted.      Assessment & Plan:   Viral Upper Respiratory Infection with Cough:  Get some rest and drink plenty of water Start Mucinex 600 mg Q12H prn Rx for Hycodan cough syrup  RTC as needed or if symptoms persist.   Webb Silversmith, NP

## 2016-07-17 NOTE — Patient Instructions (Signed)

## 2016-08-21 ENCOUNTER — Other Ambulatory Visit: Payer: Self-pay | Admitting: Physician Assistant

## 2016-08-21 MED ORDER — CLOPIDOGREL BISULFATE 75 MG PO TABS: 75.0000 mg | ORAL_TABLET | Freq: Every day | ORAL | 1 refills | Status: DC

## 2017-03-02 ENCOUNTER — Ambulatory Visit (INDEPENDENT_AMBULATORY_CARE_PROVIDER_SITE_OTHER): Payer: BLUE CROSS/BLUE SHIELD | Admitting: Physician Assistant

## 2017-03-02 ENCOUNTER — Encounter: Payer: Self-pay | Admitting: Physician Assistant

## 2017-03-02 VITALS — BP 150/80 | HR 75 | Ht 67.0 in | Wt 195.8 lb

## 2017-03-02 DIAGNOSIS — R0602 Shortness of breath: Secondary | ICD-10-CM | POA: Diagnosis not present

## 2017-03-02 DIAGNOSIS — Z720 Tobacco use: Secondary | ICD-10-CM

## 2017-03-02 DIAGNOSIS — I10 Essential (primary) hypertension: Secondary | ICD-10-CM | POA: Diagnosis not present

## 2017-03-02 DIAGNOSIS — I2510 Atherosclerotic heart disease of native coronary artery without angina pectoris: Secondary | ICD-10-CM

## 2017-03-02 DIAGNOSIS — E785 Hyperlipidemia, unspecified: Secondary | ICD-10-CM | POA: Diagnosis not present

## 2017-03-02 MED ORDER — HYDROCHLOROTHIAZIDE 12.5 MG PO CAPS: 12.5000 mg | ORAL_CAPSULE | Freq: Every day | ORAL | 3 refills | Status: DC

## 2017-03-02 NOTE — Patient Instructions (Signed)
Medication Instructions:  1. START HCTZ 12.5 MG 1 CAPSULE DAILY; RX HAS BEEN SENT IN  Labwork: 1. IN 2 WEEKS YOU WILL NEED FASTING LIPID AND CMET  Testing/Procedures: 1. Your physician has requested that you have an echocardiogram. Echocardiography is a painless test that uses sound waves to create images of your heart. It provides your doctor with information about the size and shape of your heart and how well your heart's chambers and valves are working. This procedure takes approximately one hour. There are no restrictions for this procedure.     Follow-Up: Your physician wants you to follow-up in: 6 MONTHS WITH DR. [name] will receive a reminder letter in the mail two months in advance. If you don't receive a letter, please call our office to schedule the follow-up appointment.   Any Other Special Instructions Will Be Listed Below (If Applicable).     If you need a refill on your cardiac medications before your next appointment, please call your pharmacy.

## 2017-03-02 NOTE — Progress Notes (Signed)
Cardiology Office Note:    Date:  03/02/2017   ID:  Karen Osborne, DOB [birth year], MRN [id number]  PCP:  [name], MD  Cardiologist:  Dr. Loralie Champagne  >> Dr. Sherren Mocha / Richardson Dopp, PA-C   Referring MD: [name], MD   Chief Complaint  Patient presents with  . Coronary Artery Disease    follow up    History of Present Illness:    Karen Osborne is a 61 y.o. female with a hx of CAD s/p NSTEMI 01/2012 tx with DES to mid LAD, HTN, HL, tobacco abuse.    Karen Osborne returns for Cardiology follow up.  She is here alone.  She has noticed that she is more short of breath with some activities. She has also noticed some LE edema.  The edema has improved. She denies chest pain or back pain (anginal equivalent).  She denies orthopnea, paroxysmal nocturnal dyspnea.  She denies syncope.  She has a chronic cough.  She continues to smoke but has cut back.    Prior CV studies:   The following studies were reviewed today:  Myoview (7/15):   Fixed apical defect (breast attenuation versus scar), no ischemia, EF 64%; low risk  LHC (01/27/12):   prox LAD 20%, mid LAD 99%,  prox OM 30%,  prox RCA 30%, mid RCA 50%, dist RCA 30%,  EF 60%.   PCI:  2.25 x 16 mm Promus Element DES to mid LAD.     Echo (01/28/12):   Mild LVH, EF 55%, apical septum and true apex severe HK, Gr 2 DD, mild LAE, normal RVF   Past Medical History:  Diagnosis Date  . Coronary artery disease    a. s/p NSTEMI - LHC 01/27/12: prox LAD 20%, mid LAD 99%, prox OM 30%, prox RCA 30%, mid RCA 50%, dist RCA 30%, EF 60%. PCI: 2.25 x 16 mm Promus Element DES to mid LAD //  b. Myoview (7/15): Fixed apical defect (breast attenuation versus scar), no ischemia, EF 64%; low risk   . Depression   . Glucose intolerance (impaired glucose tolerance)    A1C 6.1 01/2012  . History of echocardiogram    Echo (01/28/12): Mild LVH, EF 55%, apical septum and true apex severe HK, Gr 2 DD, mild LAE, normal RVF  .  Hypercholesteremia   . Hypertension   . NSVD (normal spontaneous vaginal delivery) 1988  . Osteopenia   . Restless leg syndrome   . Tobacco abuse     Past Surgical History:  Procedure Laterality Date  . ABDOMINAL HYSTERECTOMY    . COLONOSCOPY  10/2006   TA, [place] (Dr Vira Agar)  . CORONARY ANGIOPLASTY WITH STENT PLACEMENT  01/27/2012   DES to LAD  . gyn surgery  02/1998   total hysterectomy, abn pap smear, no cancer  . LEFT HEART CATHETERIZATION WITH CORONARY ANGIOGRAM N/A 01/27/2012   Procedure: LEFT HEART CATHETERIZATION WITH CORONARY ANGIOGRAM;  Surgeon: Burnell Blanks, MD;  Location: [facility] CATH LAB;  Service: Cardiovascular;  Laterality: N/A;  . PERCUTANEOUS CORONARY STENT INTERVENTION (PCI-S)  01/27/2012   Procedure: PERCUTANEOUS CORONARY STENT INTERVENTION (PCI-S);  Surgeon: Burnell Blanks, MD;  Location: [facility] CATH LAB;  Service: Cardiovascular;;    Current Medications: Current Meds  Medication Sig  . atorvastatin (LIPITOR) 80 MG tablet Take 1 tablet (80 mg total) by mouth at bedtime.  . carvedilol (COREG) 6.25 MG tablet TAKE 1 TABLET BY MOUTH TWICE DAILY WITH A MEAL  . clopidogrel (PLAVIX) 75 MG  tablet Take 1 tablet (75 mg total) by mouth daily.  . Ibuprofen-Diphenhydramine HCl (ADVIL PM) 200-25 MG CAPS Take 1 capsule by mouth at bedtime as needed.  Marland Kitchen lisinopril (PRINIVIL,ZESTRIL) 20 MG tablet Take 1 tablet (20 mg total) by mouth daily.  . nitroGLYCERIN (NITROSTAT) 0.4 MG SL tablet Place 1 tablet (0.4 mg total) under the tongue every 5 (five) minutes as needed for chest pain (up to 3 doses).  Marland Kitchen rOPINIRole (REQUIP) 1 MG tablet TAKE 1-2 TABLETS BY MOUTH AT BEDTIME     Allergies:   Penicillins   Social History   Social History  . Marital status: Married    Spouse name: N/A  . Number of children: 1  . Years of education: N/A   Occupational History  . 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 Smoking status:  Current Every Day Smoker    Packs/day: 0.50    Years: 38.00    Types: Cigarettes  . Smokeless tobacco: Never Used  . Alcohol use No  . Drug use: No  . Sexual activity: Yes   Other Topics Concern  . None   Social History Narrative   Works in Press photographer (Designer, television/film set and valves)     Family Hx: The patient's family history includes Diabetes in her sister; Heart attack in her father; Heart disease (age of onset: 27) in her father; Hypertension in her father; Prostate cancer in her father.  ROS:   Please see the history of present illness.    Review of Systems  Constitution: Positive for diaphoresis and malaise/fatigue.  Cardiovascular: Positive for dyspnea on exertion.  Musculoskeletal: Positive for joint swelling.   All other systems reviewed and are negative.   EKGs/Labs/Other Test Reviewed:    EKG:  EKG is  ordered today.  The ekg ordered today demonstrates NSR, HR 75, normal axis, QTc 422 ms, no change from prior tracing.   Recent Labs: No results found for requested labs within last 8760 hours.   Recent Lipid Panel Lab Results  Component Value Date/Time   CHOL 144 02/18/2015 07:46 AM   TRIG 112.0 02/18/2015 07:46 AM   HDL 33.70 (L) 02/18/2015 07:46 AM   CHOLHDL 4 02/18/2015 07:46 AM   LDLCALC 88 02/18/2015 07:46 AM    Physical Exam:    VS:  BP (!) 150/80   Pulse 75   Ht 5' 7"  (1.702 m)   Wt 195 lb 12.8 oz (88.8 kg)   BMI 30.67 kg/m     Wt Readings from Last 3 Encounters:  03/02/17 195 lb 12.8 oz (88.8 kg)  07/17/16 198 lb 8 oz (90 kg)  05/28/16 197 lb 4 oz (89.5 kg)     Physical Exam  Constitutional: She is oriented to person, place, and time. She appears well-developed and well-nourished. No distress.  HENT:  Head: Normocephalic and atraumatic.  Eyes: No scleral icterus.  Neck: Normal range of motion. JVD present.  Cardiovascular: Normal rate, regular rhythm, S1 normal, S2 normal and normal heart sounds.   No murmur heard. Pulmonary/Chest: Effort  normal and breath sounds normal. She has no wheezes. She has no rhonchi. She has no rales.  Abdominal: Soft. There is no tenderness.  Musculoskeletal: She exhibits edema (trace bilat LE edema).  Neurological: She is alert and oriented to person, place, and time.  Skin: Skin is warm and dry.  Psychiatric: She has a normal mood and affect.    ASSESSMENT:  1. Coronary artery disease involving native coronary artery of native heart without angina pectoris   2. Shortness of breath   3. Essential hypertension   4. Hyperlipidemia, unspecified hyperlipidemia type   5. Tobacco abuse    PLAN:    In order of problems listed above:  1. Coronary artery disease S/p NSTEMI in 2013 tx with DES to LAD. Nuclear stress test in 2015 was low risk.  She denies anginal symptoms.  She notes dyspnea on exertion that is likely multifactorial.  Her anginal equivalent was interscapular back pain.  She has not had this symptom.  Her ECG is unchanged.  Continue Clopidogrel, statin, beta-blocker.    2. Shortness of breath - This is probably multifactorial and related to diastolic dysfunction, obesity, probable COPD with continued smoking.  Her BP is above target.  She had mod diastolic dysfunction on her echo in 2013.  She may feel better on a low dose diuretic.    -  Obtain echocardiogram to check LV and RV function and assess pulmo pressures  -  Start HCTZ 12.5 mg QD  3. Essential hypertension -  BP above target.  Continue beta-blocker, ACE inhibitor.  Start HCTZ 12.5 mg QD.  Obtain CMET 2 weeks.  4. Hyperlipidemia - Continue statin.  Arrange CMET, Lipids.  5. Tobacco abuse - She is cutting back.  Dispo:  Return in about 6 months (around 09/01/2017) for Routine Follow Up, w/ Dr. Burt Knack, or Richardson Dopp, PA-C.   Medication Adjustments/Labs and Tests Ordered: Current medicines are reviewed at length with the patient today.  Concerns regarding medicines are outlined above.  Orders/Tests:  Orders Placed  This Encounter  Procedures  . Comp Met (CMET)  . Lipid Profile  . EKG 12-Lead  . ECHOCARDIOGRAM COMPLETE   Medication changes: Meds ordered this encounter  Medications  . hydrochlorothiazide (MICROZIDE) 12.5 MG capsule    Sig: Take 1 capsule (12.5 mg total) by mouth daily.    Dispense:  90 capsule    Refill:  3   Signed, Richardson Dopp, PA-C  03/02/2017 4:03 PM    [facility] [address], [place], [place]  [address] Phone: [phone]; Fax: [phone]

## 2017-03-16 ENCOUNTER — Ambulatory Visit (HOSPITAL_COMMUNITY): Payer: BLUE CROSS/BLUE SHIELD | Attending: Cardiovascular Disease

## 2017-03-16 ENCOUNTER — Telehealth: Payer: Self-pay | Admitting: *Deleted

## 2017-03-16 ENCOUNTER — Encounter: Payer: Self-pay | Admitting: Physician Assistant

## 2017-03-16 ENCOUNTER — Other Ambulatory Visit: Payer: Self-pay

## 2017-03-16 ENCOUNTER — Other Ambulatory Visit: Payer: BLUE CROSS/BLUE SHIELD | Admitting: *Deleted

## 2017-03-16 DIAGNOSIS — R0602 Shortness of breath: Secondary | ICD-10-CM

## 2017-03-16 DIAGNOSIS — I5030 Unspecified diastolic (congestive) heart failure: Secondary | ICD-10-CM | POA: Insufficient documentation

## 2017-03-16 DIAGNOSIS — I10 Essential (primary) hypertension: Secondary | ICD-10-CM

## 2017-03-16 DIAGNOSIS — E785 Hyperlipidemia, unspecified: Secondary | ICD-10-CM

## 2017-03-16 LAB — COMPREHENSIVE METABOLIC PANEL
ALT: 21 IU/L (ref 0–32)
AST: 18 IU/L (ref 0–40)
Albumin/Globulin Ratio: 1.8 (ref 1.2–2.2)
Albumin: 4.4 g/dL (ref 3.6–4.8)
Alkaline Phosphatase: 132 IU/L — ABNORMAL HIGH (ref 39–117)
BUN/Creatinine Ratio: 13 (ref 12–28)
BUN: 10 mg/dL (ref 8–27)
Bilirubin Total: 0.4 mg/dL (ref 0.0–1.2)
CO2: 22 mmol/L (ref 20–29)
Calcium: 9.6 mg/dL (ref 8.7–10.3)
Chloride: 94 mmol/L — ABNORMAL LOW (ref 96–106)
Creatinine, Ser: 0.77 mg/dL (ref 0.57–1.00)
GFR calc Af Amer: 97 mL/min/{1.73_m2} (ref >59)
GFR calc non Af Amer: 84 mL/min/{1.73_m2} (ref >59)
Globulin, Total: 2.4 g/dL (ref 1.5–4.5)
Glucose: 141 mg/dL — ABNORMAL HIGH (ref 65–99)
Potassium: 4.9 mmol/L (ref 3.5–5.2)
Sodium: 134 mmol/L (ref 134–144)
Total Protein: 6.8 g/dL (ref 6.0–8.5)

## 2017-03-16 LAB — LIPID PANEL
Chol/HDL Ratio: 4.3 ratio (ref 0.0–4.4)
Cholesterol, Total: 147 mg/dL (ref 100–199)
HDL: 34 mg/dL — ABNORMAL LOW (ref >39)
LDL Calculated: 93 mg/dL (ref 0–99)
Triglycerides: 98 mg/dL (ref 0–149)
VLDL Cholesterol Cal: 20 mg/dL (ref 5–40)

## 2017-03-16 NOTE — Telephone Encounter (Signed)
-----   Message from Liliane Shi, PA-C sent at 03/16/2017  9:38 AM EDT ----- Please call the patient. The echocardiogram shows normal heart function (ejection fraction) and moderately impaired relaxation (diastolic dysfunction). This is similar to the echocardiogram in 2013.  Continue current treatment plan. Please fax a copy of this study result to her PCP:  [name], MD  Thanks! Richardson Dopp, PA-C    03/16/2017 9:36 AM

## 2017-03-16 NOTE — Telephone Encounter (Signed)
Pt has been notified of echo results and findings by phone with verbal understanding. Echo results forwarded to PCP. Pt thanked me for the call today.

## 2017-03-18 ENCOUNTER — Telehealth: Payer: Self-pay | Admitting: *Deleted

## 2017-03-18 DIAGNOSIS — E7800 Pure hypercholesterolemia, unspecified: Secondary | ICD-10-CM

## 2017-03-18 DIAGNOSIS — I2510 Atherosclerotic heart disease of native coronary artery without angina pectoris: Secondary | ICD-10-CM

## 2017-03-18 MED ORDER — ROSUVASTATIN CALCIUM 40 MG PO TABS: 40.0000 mg | ORAL_TABLET | Freq: Every day | ORAL | 11 refills | Status: DC

## 2017-03-18 NOTE — Telephone Encounter (Signed)
Pt has been notified of lab results and findings by phone with verbal understanding. Pt is agreeable to plan of care to d/c Atorvastatin and start Crestor 40 mg daily, Rx has been sent in, FLP/LFT 06/18/17. I will forward a copy of the results to PCP as well. Pt advised to f/u with PCP as to ALP elevated. Pt thanked me for my call today.

## 2017-03-18 NOTE — Telephone Encounter (Signed)
-----   Message from Liliane Shi, [place] sent at 03/16/2017  4:47 PM EDT ----- Please call the patient LDL 93; needs to be < 70 LFTs ok except ALP is high. Kidney function is normal. PLAN:  DC Atorvastatin Start Rosuvastatin 40 mg QD Repeat Lipids and LFTs in 3 mos. She really needs to follow up with her PCP for the elevated ALP - please send labs to PCP. Richardson Dopp, PA-C    03/16/2017 4:45 PM

## 2017-04-04 ENCOUNTER — Encounter: Payer: Self-pay | Admitting: Family Medicine

## 2017-04-04 DIAGNOSIS — R748 Abnormal levels of other serum enzymes: Secondary | ICD-10-CM | POA: Insufficient documentation

## 2017-04-28 ENCOUNTER — Other Ambulatory Visit: Payer: Self-pay | Admitting: Physician Assistant

## 2017-04-28 ENCOUNTER — Other Ambulatory Visit: Payer: Self-pay | Admitting: Family Medicine

## 2017-05-17 ENCOUNTER — Other Ambulatory Visit: Payer: Self-pay | Admitting: Physician Assistant

## 2017-05-17 DIAGNOSIS — I10 Essential (primary) hypertension: Secondary | ICD-10-CM

## 2017-05-17 DIAGNOSIS — I2510 Atherosclerotic heart disease of native coronary artery without angina pectoris: Secondary | ICD-10-CM

## 2017-05-26 ENCOUNTER — Other Ambulatory Visit: Payer: Self-pay | Admitting: Family Medicine

## 2017-05-26 ENCOUNTER — Other Ambulatory Visit: Payer: Self-pay | Admitting: Cardiology

## 2017-05-26 DIAGNOSIS — I10 Essential (primary) hypertension: Secondary | ICD-10-CM

## 2017-05-27 ENCOUNTER — Other Ambulatory Visit: Payer: Self-pay | Admitting: Cardiology

## 2017-05-27 NOTE — Telephone Encounter (Signed)
Please call office and schedule appointment for further refills. 

## 2017-06-18 ENCOUNTER — Other Ambulatory Visit: Payer: BLUE CROSS/BLUE SHIELD

## 2017-06-24 ENCOUNTER — Other Ambulatory Visit: Payer: Self-pay | Admitting: Family Medicine

## 2017-06-25 ENCOUNTER — Other Ambulatory Visit: Payer: BLUE CROSS/BLUE SHIELD | Admitting: *Deleted

## 2017-06-25 DIAGNOSIS — E7800 Pure hypercholesterolemia, unspecified: Secondary | ICD-10-CM

## 2017-06-26 LAB — HEPATIC FUNCTION PANEL
ALT: 13 IU/L (ref 0–32)
AST: 11 IU/L (ref 0–40)
Albumin: 4.3 g/dL (ref 3.6–4.8)
Alkaline Phosphatase: 95 IU/L (ref 39–117)
Bilirubin Total: 0.4 mg/dL (ref 0.0–1.2)
Bilirubin, Direct: 0.13 mg/dL (ref 0.00–0.40)
Total Protein: 6.6 g/dL (ref 6.0–8.5)

## 2017-06-26 LAB — LIPID PANEL
Chol/HDL Ratio: 4.4 ratio (ref 0.0–4.4)
Cholesterol, Total: 123 mg/dL (ref 100–199)
HDL: 28 mg/dL — ABNORMAL LOW (ref >39)
LDL Calculated: 64 mg/dL (ref 0–99)
Triglycerides: 153 mg/dL — ABNORMAL HIGH (ref 0–149)
VLDL Cholesterol Cal: 31 mg/dL (ref 5–40)

## 2017-06-28 ENCOUNTER — Telehealth: Payer: Self-pay | Admitting: *Deleted

## 2017-06-28 NOTE — Telephone Encounter (Signed)
Left message to go over lab results.  

## 2017-06-28 NOTE — Telephone Encounter (Signed)
-----   Message from Liliane Shi, [place] sent at 06/27/2017  5:06 PM EDT ----- Please call patient. The LDL is at goal.   Other cholesterol parameters are at acceptable levels. The liver enzyme tests are normal. Continue with current treatment plan. Richardson Dopp, PA-C 06/27/2017 5:05 PM  06/27/2017 5:05 PM

## 2017-06-29 NOTE — Telephone Encounter (Signed)
-----   Message from Liliane Shi, [place] sent at 06/27/2017  5:06 PM EDT ----- Please call patient. The LDL is at goal.   Other cholesterol parameters are at acceptable levels. The liver enzyme tests are normal. Continue with current treatment plan. Richardson Dopp, PA-C 06/27/2017 5:05 PM  06/27/2017 5:05 PM

## 2017-06-29 NOTE — Telephone Encounter (Signed)
Pt has been notified of lab results by phone with verbal understanding. Results have been mailed to the pt as well. Pt's address has been verified while on the phone. Pt thanked me for my call.

## 2017-06-29 NOTE — Telephone Encounter (Signed)
838am pt rtn call to go over lab results-pls call cell [phone]

## 2017-07-15 ENCOUNTER — Other Ambulatory Visit: Payer: Self-pay | Admitting: Family Medicine

## 2017-09-14 DIAGNOSIS — J449 Chronic obstructive pulmonary disease, unspecified: Secondary | ICD-10-CM

## 2017-09-14 DIAGNOSIS — E119 Type 2 diabetes mellitus without complications: Secondary | ICD-10-CM

## 2017-09-14 DIAGNOSIS — J439 Emphysema, unspecified: Secondary | ICD-10-CM

## 2017-09-14 HISTORY — DX: Chronic obstructive pulmonary disease, unspecified: J44.9

## 2017-09-14 HISTORY — DX: Type 2 diabetes mellitus without complications: E11.9

## 2017-09-14 HISTORY — DX: Emphysema, unspecified: J43.9

## 2017-10-31 ENCOUNTER — Other Ambulatory Visit: Payer: Self-pay | Admitting: Cardiovascular Disease

## 2017-12-14 ENCOUNTER — Other Ambulatory Visit: Payer: Self-pay | Admitting: Physician Assistant

## 2017-12-14 DIAGNOSIS — E7800 Pure hypercholesterolemia, unspecified: Secondary | ICD-10-CM

## 2017-12-14 MED ORDER — ROSUVASTATIN CALCIUM 40 MG PO TABS: 40.0000 mg | ORAL_TABLET | Freq: Every day | ORAL | 0 refills | Status: DC

## 2018-01-01 ENCOUNTER — Other Ambulatory Visit: Payer: Self-pay | Admitting: Family Medicine

## 2018-01-03 NOTE — Telephone Encounter (Signed)
Electronic refill request Last office visit 05/28/16 Last refills message sent to pharmacy that patient needs to schedule office visit for further appointments No upcoming appointment scheduled

## 2018-01-06 NOTE — Progress Notes (Signed)
Cardiology Office Note:    Date:  01/07/2018   ID:  Karen Osborne, DOB 06-24-1956, MRN [id number]  PCP:  Karen Bush, MD  Cardiologist:  Karen Mocha, MD   Referring MD: Karen Bush, MD   Chief Complaint  Patient presents with  . Follow-up    CAD    History of Present Illness:    Karen Osborne is a 62 y.o. female with CAD s/p NSTEMI 01/2012 tx with DES to mid LAD, HTN, HL, tobacco abuse.  She was last seen in June 2018.  She did complain of shortness of breath and a follow up echocardiogram demonstrated EF 84-69, mod diastolic dysfunction.  Ms. Karen Osborne returns for follow up.  She notes gradually worsening dyspnea on exertion over the past 6 months.  She denies any chest pain, back pain, arm or jaw pain associated with her shortness of breath.  She denies orthopnea, paroxysmal nocturnal dyspnea, significant edema.  She denies syncope, near syncope.    Prior CV studies:   The following studies were reviewed today:  Echo 03/16/2017 Mild LVH, EF 62-95, grade 2 diastolic dysfunction, normal wall motion  Myoview (7/15): Fixed apical defect (breast attenuation versus scar), no ischemia, EF 64%; low risk  LHC (01/27/12): prox LAD 20%, mid LAD 99%,  prox OM 30%,  prox RCA 30%, mid RCA 50%, dist RCA 30%,  EF 60%. PCI:2.25 x 16 mm Promus Element DES to mid LAD.  Echo (01/28/12): Mild LVH, EF 55%, apical septum and true apex severe HK, Gr 2 DD, mild LAE, normal RVF  Past Medical History:  Diagnosis Date  . Coronary artery disease    a. s/p NSTEMI - LHC 01/27/12: prox LAD 20%, mid LAD 99%, prox OM 30%, prox RCA 30%, mid RCA 50%, dist RCA 30%, EF 60%. PCI: 2.25 x 16 mm Promus Element DES to mid LAD //  b. Myoview (7/15): Fixed apical defect (breast attenuation versus scar), no ischemia, EF 64%; low risk   . Depression   . Glucose intolerance (impaired glucose tolerance)    A1C 6.1 01/2012  . History of echocardiogram    Echo (01/28/12): Mild LVH, EF 55%,  apical septum and true apex severe HK, Gr 2 DD, mild LAE, normal RVF // Echo 7/18: mild LVH, EF 60-65, no RWMA, Gr 2 DD, normal RVF  . Hypercholesteremia   . Hypertension   . NSVD (normal spontaneous vaginal delivery) 1988  . Osteopenia   . Restless leg syndrome   . Tobacco abuse    Surgical Hx: The patient  has a past surgical history that includes gyn surgery (02/1998); Abdominal hysterectomy; Coronary angioplasty with stent (01/27/2012); left heart catheterization with coronary angiogram (N/A, 01/27/2012); percutaneous coronary stent intervention (pci-s) (01/27/2012); and Colonoscopy (10/2006).   Current Medications: Current Meds  Medication Sig  . carvedilol (COREG) 6.25 MG tablet TAKE 1 TABLET BY MOUTH TWICE DAILY WITH A MEAL  . clopidogrel (PLAVIX) 75 MG tablet TAKE 1 TABLET BY MOUTH EVERY DAY  . hydrochlorothiazide (MICROZIDE) 12.5 MG capsule Take 1 capsule (12.5 mg total) by mouth daily.  . Ibuprofen-Diphenhydramine HCl (ADVIL PM) 200-25 MG CAPS Take 1 capsule by mouth at bedtime as needed.  Marland Kitchen lisinopril (PRINIVIL,ZESTRIL) 20 MG tablet TAKE 1 TABLET (20 MG TOTAL) BY MOUTH DAILY.  Marland Kitchen rOPINIRole (REQUIP) 1 MG tablet TAKE 1-2 TABLETS BY MOUTH AT BEDTIME. NEEDS OFFICE VISIT  . rosuvastatin (CRESTOR) 40 MG tablet Take 1 tablet (40 mg total) by mouth daily. Please keep upcoming appt in April  for future refills. Thank you     Allergies:   Penicillins   Social History   Tobacco Use  . Smoking status: Current Every Day Smoker    Packs/day: 0.50    Years: 38.00    Pack years: 19.00    Types: Cigarettes  . Smokeless tobacco: Never Used  Substance Use Topics  . Alcohol use: No  . Drug use: No     Family Hx: The patient's family history includes Diabetes in her sister; Heart attack in her father; Heart disease (age of onset: 75) in her father; Hypertension in her father; Prostate cancer in her father.  ROS:   Please see the history of present illness.    Review of Systems    Constitution: Positive for malaise/fatigue.  Cardiovascular: Positive for leg swelling.  Respiratory: Positive for cough and shortness of breath.   Musculoskeletal: Positive for joint pain.  Psychiatric/Behavioral: Positive for depression.   All other systems reviewed and are negative.   EKGs/Labs/Other Test Reviewed:    EKG:  EKG is   ordered today.  The ekg ordered today demonstrates normal sinus rhythm, heart rate 78, normal axis, QTC 421, no change from prior tracing  Recent Labs: 03/16/2017: BUN 10; Creatinine, Ser 0.77; Potassium 4.9; Sodium 134 06/25/2017: ALT 13   Recent Lipid Panel Lab Results  Component Value Date/Time   CHOL 123 06/25/2017 07:34 AM   TRIG 153 (H) 06/25/2017 07:34 AM   HDL 28 (L) 06/25/2017 07:34 AM   CHOLHDL 4.4 06/25/2017 07:34 AM   CHOLHDL 4 02/18/2015 07:46 AM   LDLCALC 64 06/25/2017 07:34 AM    Physical Exam:    VS:  BP 102/60   Pulse 80   Ht 5\' 7"  (1.702 m)   Wt 192 lb 12.8 oz (87.5 kg)   SpO2 97%   BMI 30.20 kg/m     Wt Readings from Last 3 Encounters:  01/07/18 192 lb 12.8 oz (87.5 kg)  03/02/17 195 lb 12.8 oz (88.8 kg)  07/17/16 198 lb 8 oz (90 kg)     Physical Exam  Constitutional: She is oriented to person, place, and time. She appears well-developed and well-nourished. No distress.  HENT:  Head: Normocephalic and atraumatic.  Neck: Neck supple. No JVD present.  Cardiovascular: Normal rate, regular rhythm, S1 normal, S2 normal and normal heart sounds.  No murmur heard. Pulmonary/Chest: Effort normal. She has decreased breath sounds. She has no wheezes. She has no rales.  Abdominal: Soft. There is no hepatomegaly.  Musculoskeletal: She exhibits no edema.  Neurological: She is alert and oriented to person, place, and time.  Skin: Skin is warm and dry.    ASSESSMENT & PLAN:    #1.  Shortness of breath She has gradually worsening shortness of breath with exertion over the past 6 months.  She has a chronic cough related to  smoking.  She denies any wheezing.  She does not display any evidence of congestive heart failure on exam.  She denies any discomfort reminiscent of her previous angina.  ECG today demonstrates no significant change.  Etiology of her shortness of breath is likely related to long-term tobacco abuse.  I suspect she probably has COPD.  However, ischemia needs to be ruled out.  -Obtain BMET, CBC, BNP  -Arrange exercise Myoview (convert to [place] if needed)  -Arrange chest x-ray  -Consider referral to pulmonology if above testing negative  -Close follow-up 3 weeks  #2.  Coronary artery disease involving native coronary artery of native  heart without angina pectoris History of non-ST elevation myocardial infarction 2013 treated with drug-eluting stent to the LAD.  She presents today with gradually worsening shortness of breath with minimal activity over the past 6 months.  She denies any chest discomfort.  Her ECG does not demonstrate any significant ischemic changes.  As noted above, I will arrange a nuclear stress test for further evaluation.  Continue clopidogrel, carvedilol, lisinopril, rosuvastatin.  #3.  Essential hypertension The patient's blood pressure is controlled on her current regimen.  Continue current therapy.   #4.  HYPERCHOLESTEROLEMIA LDL optimal on most recent lab work.  Continue current Rx.    #5.  TOBACCO ABUSE   I have again recommended cessation.  We discussed different strategies for quitting.  I spent approximately 3-5 minutes counseling her on tobacco cessation.   Dispo:  Return in about 3 weeks (around 01/28/2018) for Close Follow Up, w/ Dr. Burt Knack, or Richardson Dopp, PA-C.   Medication Adjustments/Labs and Tests Ordered: Current medicines are reviewed at length with the patient today.  Concerns regarding medicines are outlined above.  Tests Ordered: Orders Placed This Encounter  Procedures  . DG Chest 2 View  . Basic Metabolic Panel (BMET)  . CBC  . Pro b  natriuretic peptide  . Myocardial Perfusion Imaging  . EKG 12-Lead   Medication Changes: No orders of the defined types were placed in this encounter.   Signed, Richardson Dopp, PA-C  01/07/2018 2:38 PM    [facility] [place], [place], [place]  [address] Phone: [phone]; Fax: [phone]

## 2018-01-07 ENCOUNTER — Ambulatory Visit
Admission: RE | Admit: 2018-01-07 | Discharge: 2018-01-07 | Disposition: A | Discharge status: Home / Self Care | Payer: BLUE CROSS/BLUE SHIELD | Source: Ambulatory Visit | Attending: Physician Assistant | Admitting: Physician Assistant

## 2018-01-07 ENCOUNTER — Ambulatory Visit: Payer: BLUE CROSS/BLUE SHIELD | Admitting: Physician Assistant

## 2018-01-07 ENCOUNTER — Telehealth (HOSPITAL_COMMUNITY): Payer: Self-pay | Admitting: *Deleted

## 2018-01-07 ENCOUNTER — Encounter: Payer: Self-pay | Admitting: Physician Assistant

## 2018-01-07 VITALS — BP 102/60 | HR 80 | Ht 67.0 in | Wt 192.8 lb

## 2018-01-07 DIAGNOSIS — R0602 Shortness of breath: Secondary | ICD-10-CM | POA: Diagnosis not present

## 2018-01-07 DIAGNOSIS — I10 Essential (primary) hypertension: Secondary | ICD-10-CM | POA: Diagnosis not present

## 2018-01-07 DIAGNOSIS — I2510 Atherosclerotic heart disease of native coronary artery without angina pectoris: Secondary | ICD-10-CM | POA: Diagnosis not present

## 2018-01-07 DIAGNOSIS — F17200 Nicotine dependence, unspecified, uncomplicated: Secondary | ICD-10-CM

## 2018-01-07 DIAGNOSIS — E7800 Pure hypercholesterolemia, unspecified: Secondary | ICD-10-CM

## 2018-01-07 LAB — BASIC METABOLIC PANEL
BUN/Creatinine Ratio: 14 (ref 12–28)
BUN: 15 mg/dL (ref 8–27)
CO2: 22 mmol/L (ref 20–29)
Calcium: 9.7 mg/dL (ref 8.7–10.3)
Chloride: 93 mmol/L — ABNORMAL LOW (ref 96–106)
Creatinine, Ser: 1.11 mg/dL — ABNORMAL HIGH (ref 0.57–1.00)
GFR calc Af Amer: 62 mL/min/{1.73_m2} (ref >59)
GFR calc non Af Amer: 54 mL/min/{1.73_m2} — ABNORMAL LOW (ref >59)
Glucose: 489 mg/dL — ABNORMAL HIGH (ref 65–99)
Potassium: 4 mmol/L (ref 3.5–5.2)
Sodium: 131 mmol/L — ABNORMAL LOW (ref 134–144)

## 2018-01-07 LAB — PRO B NATRIURETIC PEPTIDE: NT-Pro BNP: 20 pg/mL (ref 0–287)

## 2018-01-07 LAB — CBC
Hematocrit: 42.5 % (ref 34.0–46.6)
Hemoglobin: 14.6 g/dL (ref 11.1–15.9)
MCH: 31.4 pg (ref 26.6–33.0)
MCHC: 34.4 g/dL (ref 31.5–35.7)
MCV: 91 fL (ref 79–97)
Platelets: 271 10*3/uL (ref 150–379)
RBC: 4.65 x10E6/uL (ref 3.77–5.28)
RDW: 12.9 % (ref 12.3–15.4)
WBC: 9 10*3/uL (ref 3.4–10.8)

## 2018-01-07 NOTE — Patient Instructions (Addendum)
Medication Instructions: Your physician recommends that you continue on your current medications as directed. Please refer to the Current Medication list given to you today.   Labwork: TODAY: BMET,CBC, PRO BNP  Procedures/Testing: Your physician has requested that you have en exercise stress myoview. For further information please visit [email or web address]. Please follow instruction sheet, as given.  A chest x-ray takes a picture of the organs and structures inside the chest, including the heart, lungs, and blood vessels. This test can show several things, including, whether the heart is enlarges; whether fluid is building up in the lungs; and whether pacemaker / defibrillator leads are still in place.   Follow-Up: Your physician recommends that you schedule a follow-up appointment in: 2-3 weeks with Richardson Dopp PA-C   Any Additional Special Instructions Will Be Listed Below (If Applicable).  Chest X-Ray A chest X-ray is a painless test that uses radiation to create images of the structures inside of your chest. Chest X-rays are used to look for many health conditions, including heart failure, pneumonia, tuberculosis, rib fractures, breathing disorders, and cancer. They may be used to diagnose chest pain, constant coughing, or trouble breathing. Tell a health care provider about:  Any allergies you have.  All medicines you are taking, including vitamins, herbs, eye drops, creams, and over-the-counter medicines.  Any surgeries you have had.  Any medical conditions you have.  Whether you are pregnant or may be pregnant. What are the risks? Getting a chest X-ray is a safe procedure. However, you will be exposed to a small amount of radiation. Being exposed to too much radiation over a lifetime can increase the risk of cancer. This risk is small, but it may occur if you have many X-rays throughout your life. What happens before the procedure?  You may be asked to remove glasses,  jewelry, and any other metal objects.  You will be asked to undress from the waist up. You may be given a hospital gown to wear.  You may be asked to wear a protective lead apron to protect parts of your body from radiation. What happens during the procedure?  You will be asked to stand still as each picture is taken to get the best possible images.  You will be asked to take a deep breath and hold your breath for a few seconds.  The X-ray machine will create a picture of your chest using a tiny burst of radiation. This is painless.  More pictures may be taken from other angles. Typically, one picture will be taken while you face the X-ray camera, and another picture will be taken from the side while you stand. If you cannot stand, you may be asked to lie down. The procedure may vary among health care providers and hospitals. What happens after the procedure?  The X-ray(s) will be reviewed by your health care provider or an X-ray (radiology) specialist.  It is up to you to get your test results. Ask your health care provider, or the department that is doing the test, when your results will be ready.  Your health care provider will tell you if you need more tests or a follow-up exam. Keep all follow-up visits as told by your health care provider. This is important. Summary  A chest X-ray is a safe, painless test that is used to examine the inside of the chest, heart, and lungs.  You will need to undress from the waist up and remove jewelry and metal objects before the procedure.  You will  be exposed to a small amount of radiation during the procedure.  The X-ray machine will take one or more pictures of your chest while you remain as still as possible.  Later, a health care provider or specialist will review the test results with you. This information is not intended to replace advice given to you by your health care provider. Make sure you discuss any questions you have with your  health care provider. Document Released: 10/27/2016 Document Revised: 10/27/2016 Document Reviewed: 10/27/2016 Elsevier Interactive Patient Education  [employer].    If you need a refill on your cardiac medications before your next appointment, please call your pharmacy.

## 2018-01-07 NOTE — Telephone Encounter (Signed)
Patient given detailed instructions per Myocardial Perfusion Study Information Sheet for the test on 01/10/18 at 7:45. Patient notified to arrive 15 minutes early and that it is imperative to arrive on time for appointment to keep from having the test rescheduled.  If you need to cancel or reschedule your appointment, please call the office within 24 hours of your appointment. . Patient verbalized understanding.Karen Osborne

## 2018-01-10 ENCOUNTER — Encounter: Payer: Self-pay | Admitting: Physician Assistant

## 2018-01-10 ENCOUNTER — Ambulatory Visit: Payer: Self-pay | Admitting: *Deleted

## 2018-01-10 ENCOUNTER — Ambulatory Visit (HOSPITAL_COMMUNITY): Payer: BLUE CROSS/BLUE SHIELD | Attending: Cardiovascular Disease

## 2018-01-10 DIAGNOSIS — R0602 Shortness of breath: Secondary | ICD-10-CM | POA: Diagnosis not present

## 2018-01-10 LAB — MYOCARDIAL PERFUSION IMAGING
Estimated workload: 6.3 METS
Exercise duration (min): 4 min
LV dias vol: 68 mL (ref 46–106)
LV sys vol: 18 mL
MPHR: 159 {beats}/min
Peak HR: 121 {beats}/min
Percent HR: 76 %
RATE: 0.35
RPE: 19
Rest HR: 79 {beats}/min
SDS: 0
SRS: 3
SSS: 3
TID: 0.97

## 2018-01-10 MED ORDER — TECHNETIUM TC 99M TETROFOSMIN IV KIT
32.6000 | PACK | Freq: Once | INTRAVENOUS | Status: AC | PRN
  Administered 2018-01-10: 32.6 via INTRAVENOUS
  Filled 2018-01-10: qty 33

## 2018-01-10 MED ORDER — TECHNETIUM TC 99M TETROFOSMIN IV KIT
10.3000 | PACK | Freq: Once | INTRAVENOUS | Status: AC | PRN
  Administered 2018-01-10: 10.3 via INTRAVENOUS
  Filled 2018-01-10: qty 11

## 2018-01-10 MED ORDER — REGADENOSON 0.4 MG/5ML IV SOLN
0.4000 mg | Freq: Once | INTRAVENOUS | Status: AC
  Administered 2018-01-10: 0.4 mg via INTRAVENOUS

## 2018-01-10 NOTE — Telephone Encounter (Signed)
Left message for pt to call back.  Need to relay Dr. G's instructions.  

## 2018-01-10 NOTE — Telephone Encounter (Signed)
Pt reports had appt with cardiologist Friday 01/07/18. States practice called her at home to inform her her blood glucose level was 489; unsure if fasting. Pt checked her BS at her sister's home with sister's home monitor on Saturday, 567. Pt stated checked around 1130 after she had eaten. Pt denies any symptoms; denies frequent urination, rapid respirations, no nausea, weakness, dizziness. No increased thirst. Pt is not able to check BS presently.  Agent had made appt with Dr. Danise Mina for tomorrow at 1130 prior to NT. Care advise given per protocol. Instructed pt to call back if any symptoms reviewed present. Instructed pt she should not eat prior to appt in event of any lab orders. Reason for Disposition . New onset Diabetes suspected (e.g., frequent urination, weakness, weight loss)    Asymptomatic  Answer Assessment - Initial Assessment Questions 1. BLOOD GLUCOSE: "What is your blood glucose level?"     Blood glucose checked at cardiologist 01/07/18 was 489.       CBG 567 last checked Saturday with sister's home monitor 2. ONSET: "When did you check the blood glucose?"     Saturday 3. USUAL RANGE: "What is your glucose level usually?" (e.g., usual fasting morning value, usual evening value)    Not sure 4. KETONES: "Do you check for ketones (urine or blood test strips)?" If yes, ask: "What does the test show now?"      no 5. TYPE 1 or 2:  "Do you know what type of diabetes you have?"  (e.g., Type 1, Type 2, Gestational; doesn't know)      No diagnosis 6. INSULIN: "Do you take insulin?" If yes, ask: "Have you missed any shots recently?"     no 7. DIABETES PILLS: "Do you take any pills for your diabetes?" If yes, ask: "Have you missed taking any pills recently?"     n/a 8. OTHER SYMPTOMS: "Do you have any symptoms?" (e.g., fever, frequent urination, difficulty breathing, dizziness, weakness, vomiting)     no  Protocols used: DIABETES - HIGH BLOOD SUGAR-A-AH

## 2018-01-10 NOTE — Telephone Encounter (Signed)
Thank you. I'd like her to start low sugar, low carb diet, increase water intake to help lower sugars in interim.

## 2018-01-11 ENCOUNTER — Encounter: Payer: Self-pay | Admitting: Family Medicine

## 2018-01-11 ENCOUNTER — Ambulatory Visit: Payer: BLUE CROSS/BLUE SHIELD | Admitting: Family Medicine

## 2018-01-11 ENCOUNTER — Telehealth: Payer: Self-pay | Admitting: *Deleted

## 2018-01-11 VITALS — BP 122/68 | HR 77 | Temp 98.0°F | Ht 67.0 in | Wt 191.5 lb

## 2018-01-11 DIAGNOSIS — F17200 Nicotine dependence, unspecified, uncomplicated: Secondary | ICD-10-CM | POA: Diagnosis not present

## 2018-01-11 DIAGNOSIS — R739 Hyperglycemia, unspecified: Secondary | ICD-10-CM | POA: Diagnosis not present

## 2018-01-11 DIAGNOSIS — E1169 Type 2 diabetes mellitus with other specified complication: Secondary | ICD-10-CM | POA: Diagnosis not present

## 2018-01-11 DIAGNOSIS — I2510 Atherosclerotic heart disease of native coronary artery without angina pectoris: Secondary | ICD-10-CM | POA: Diagnosis not present

## 2018-01-11 DIAGNOSIS — E7800 Pure hypercholesterolemia, unspecified: Secondary | ICD-10-CM

## 2018-01-11 LAB — POCT GLYCOSYLATED HEMOGLOBIN (HGB A1C): Hemoglobin A1C: 12.7

## 2018-01-11 MED ORDER — METFORMIN HCL 500 MG PO TABS: 500.0000 mg | ORAL_TABLET | Freq: Two times a day (BID) | ORAL | 1 refills | Status: DC

## 2018-01-11 NOTE — Progress Notes (Signed)
BP 122/68 (BP Location: Left Arm, Patient Position: Sitting, Cuff Size: Normal)   Pulse 77   Temp 98 F (36.7 C) (Oral)   Ht 5\' 7"  (1.702 m)   Wt 191 lb 8 oz (86.9 kg)   SpO2 96%   BMI 29.99 kg/m    CC: new DM dx Subjective:    Patient ID: Karen Osborne, female    DOB: 01/22/1956, 62 y.o.   MRN: [id number]  HPI: Karen Osborne is a 62 y.o. female presenting on 01/11/2018 for Elevated blood sugar (Pt was seen by cardio on 01/07/18. Was informed BS was 489. )   I last saw patient 05/2016.  Recent cards eval for dyspnea/fatigue, found to have cbg 489. Other than dyspnea she has been feeling well. Endorse intermittent paresthesias of hands noticed a week ago. Denies hypoglycemic symptoms. No diet changes recently.   She started hydrochlorothiazide 1 yr ago - ?relation. Advised by cards to stop diuretic.   fmhx diabetes - sister, mother, 3 aunts.   She did have stress test yesterday - planned f/u with cards.  Smoking - down to 3/4 ppd.   Relevant past medical, surgical, family and social history reviewed and updated as indicated. Interim medical history since our last visit reviewed. Allergies and medications reviewed and updated. Outpatient Medications Prior to Visit  Medication Sig Dispense Refill  . carvedilol (COREG) 6.25 MG tablet TAKE 1 TABLET BY MOUTH TWICE DAILY WITH A MEAL 180 tablet 3  . clopidogrel (PLAVIX) 75 MG tablet TAKE 1 TABLET BY MOUTH EVERY DAY 90 tablet 0  . Ibuprofen-Diphenhydramine HCl (ADVIL PM) 200-25 MG CAPS Take 1 capsule by mouth at bedtime as needed.    Marland Kitchen lisinopril (PRINIVIL,ZESTRIL) 20 MG tablet TAKE 1 TABLET (20 MG TOTAL) BY MOUTH DAILY. 90 tablet 2  . rOPINIRole (REQUIP) 1 MG tablet TAKE 1-2 TABLETS BY MOUTH AT BEDTIME. NEEDS OFFICE VISIT 60 tablet 0  . rosuvastatin (CRESTOR) 40 MG tablet Take 1 tablet (40 mg total) by mouth daily. Please keep upcoming appt in April for future refills. Thank you 90 tablet 0  . nitroGLYCERIN (NITROSTAT) 0.4 MG SL tablet  Place 1 tablet (0.4 mg total) under the tongue every 5 (five) minutes as needed for chest pain (up to 3 doses). 25 tablet 3  . hydrochlorothiazide (MICROZIDE) 12.5 MG capsule Take 1 capsule (12.5 mg total) by mouth daily. 90 capsule 3   No facility-administered medications prior to visit.      Per HPI unless specifically indicated in ROS section below Review of Systems     Objective:    BP 122/68 (BP Location: Left Arm, Patient Position: Sitting, Cuff Size: Normal)   Pulse 77   Temp 98 F (36.7 C) (Oral)   Ht 5\' 7"  (1.702 m)   Wt 191 lb 8 oz (86.9 kg)   SpO2 96%   BMI 29.99 kg/m   Wt Readings from Last 3 Encounters:  01/11/18 191 lb 8 oz (86.9 kg)  01/10/18 192 lb (87.1 kg)  01/07/18 192 lb 12.8 oz (87.5 kg)    Physical Exam  Constitutional: She appears well-developed and well-nourished. No distress.  HENT:  Mouth/Throat: Oropharynx is clear and moist. No oropharyngeal exudate.  Cardiovascular: Normal rate, regular rhythm and normal heart sounds.  No murmur heard. Pulmonary/Chest: Effort normal and breath sounds normal. No respiratory distress. She has no wheezes. She has no rales.  Musculoskeletal: She exhibits no edema.  Nursing note and vitals reviewed.  Results for orders placed  or performed in visit on 01/11/18  POCT glycosylated hemoglobin (Hb A1C)  Result Value Ref Range   Hemoglobin A1C 12.7       Assessment & Plan:   Problem List Items Addressed This Visit    HYPERCHOLESTEROLEMIA    On good regimen of crestor 40mg  - LDL at goal 71.  The ASCVD Risk score Mikey Bussing DC Jr., et al., 2013) failed to calculate for the following reasons:   The valid total cholesterol range is 130 to 320 mg/dL       TOBACCO ABUSE    Continue to encourage cessation. Contemplative.       Type 2 diabetes mellitus with other specified complication ([place]) - Primary    New diagnosis. Reviewed pathophysiology of diabetes as well as monitoring parameters and management options. Will refer  to diabetes education. I asked her to check with pharmacy for preferred glucometer brand and then will send to start checking routinely at home. Reviewed timing of sugar checks as well as fasting and postprandial goal ranges. Start metformin 500mg  bid - reviewed side effects to expect whenever starting or titrating med. RTC 3 wks close f/u visit. Pt agrees with plan.       Relevant Medications   metFORMIN (GLUCOPHAGE) 500 MG tablet   Other Relevant Orders   Ambulatory referral to diabetic education    Other Visit Diagnoses    Elevated blood sugar       Relevant Orders   POCT glycosylated hemoglobin (Hb A1C) (Completed)       Meds ordered this encounter  Medications  . metFORMIN (GLUCOPHAGE) 500 MG tablet    Sig: Take 1 tablet (500 mg total) by mouth 2 (two) times daily with a meal. First week take once daily    Dispense:  180 tablet    Refill:  1   Orders Placed This Encounter  Procedures  . Ambulatory referral to diabetic education    Referral Priority:   Routine    Referral Type:   Consultation    Referral Reason:   Specialty Services Required    Number of Visits Requested:   1  . POCT glycosylated hemoglobin (Hb A1C)    Follow up plan: Return in about 1 month (around 02/08/2018) for follow up visit.  [name], MD

## 2018-01-11 NOTE — Telephone Encounter (Signed)
Left message to go over Myoview results. Results have also been sent to [facility] .

## 2018-01-11 NOTE — Assessment & Plan Note (Addendum)
Continue to encourage cessation. Contemplative. 

## 2018-01-11 NOTE — Assessment & Plan Note (Addendum)
On good regimen of crestor 40mg  - LDL at goal 71.  The ASCVD Risk score Karen Bussing DC Jr., et al., 2013) failed to calculate for the following reasons:   The valid total cholesterol range is 130 to 320 mg/dL

## 2018-01-11 NOTE — Telephone Encounter (Signed)
-----   Message from Liliane Shi, [place] sent at 01/10/2018  9:11 PM EDT ----- Please call the patient. The stress test is normal.   Continue current medications and follow up as planned.  Please fax a copy to PCP:  [name], MD  Richardson Dopp, PA-C    01/10/2018 9:09 PM

## 2018-01-11 NOTE — Patient Instructions (Addendum)
New diabetes diagnosis based on high A1c.  Start metformin 500mg  once daily for 1 week then increase to twice daily with meals.  We will refer you to diabetes classes (at [facility]).  Work on low sugar, low simple carb diet (diabetic diet) - see handout Check with pharmacy about preferred sugar meter brand (one-touch, accuchek, relion?) once we send in glucose meter, check about once a day - either before a meal (goal sugar 80-120) or 2 hours after a meal (100-180).  Return in 3 weeks for follow up visit.   Diabetes Mellitus and Nutrition When you have diabetes (diabetes mellitus), it is very important to have healthy eating habits because your blood sugar (glucose) levels are greatly affected by what you eat and drink. Eating healthy foods in the appropriate amounts, at about the same times every day, can help you:  Control your blood glucose.  Lower your risk of heart disease.  Improve your blood pressure.  Reach or maintain a healthy weight.  Every person with diabetes is different, and each person has different needs for a meal plan. Your health care provider may recommend that you work with a diet and nutrition specialist (dietitian) to make a meal plan that is best for you. Your meal plan may vary depending on factors such as:  The calories you need.  The medicines you take.  Your weight.  Your blood glucose, blood pressure, and cholesterol levels.  Your activity level.  Other health conditions you have, such as heart or kidney disease.  How do carbohydrates affect me? Carbohydrates affect your blood glucose level more than any other type of food. Eating carbohydrates naturally increases the amount of glucose in your blood. Carbohydrate counting is a method for keeping track of how many carbohydrates you eat. Counting carbohydrates is important to keep your blood glucose at a healthy level, especially if you use insulin or take certain oral diabetes medicines. It is important to  know how many carbohydrates you can safely have in each meal. This is different for every person. Your dietitian can help you calculate how many carbohydrates you should have at each meal and for snack. Foods that contain carbohydrates include:  Bread, cereal, rice, pasta, and crackers.  Potatoes and corn.  Peas, beans, and lentils.  Milk and yogurt.  Fruit and juice.  Desserts, such as cakes, cookies, ice cream, and candy.  How does alcohol affect me? Alcohol can cause a sudden decrease in blood glucose (hypoglycemia), especially if you use insulin or take certain oral diabetes medicines. Hypoglycemia can be a life-threatening condition. Symptoms of hypoglycemia (sleepiness, dizziness, and confusion) are similar to symptoms of having too much alcohol. If your health care provider says that alcohol is safe for you, follow these guidelines:  Limit alcohol intake to no more than 1 drink per day for nonpregnant women and 2 drinks per day for men. One drink equals 12 oz of beer, 5 oz of wine, or 1 oz of hard liquor.  Do not drink on an empty stomach.  Keep yourself hydrated with water, diet soda, or unsweetened iced tea.  Keep in mind that regular soda, juice, and other mixers may contain a lot of sugar and must be counted as carbohydrates.  What are tips for following this plan? Reading food labels  Start by checking the serving size on the label. The amount of calories, carbohydrates, fats, and other nutrients listed on the label are based on one serving of the food. Many foods contain more  than one serving per package.  Check the total grams (g) of carbohydrates in one serving. You can calculate the number of servings of carbohydrates in one serving by dividing the total carbohydrates by 15. For example, if a food has 30 g of total carbohydrates, it would be equal to 2 servings of carbohydrates.  Check the number of grams (g) of saturated and trans fats in one serving. Choose foods  that have low or no amount of these fats.  Check the number of milligrams (mg) of sodium in one serving. Most people should limit total sodium intake to less than 2,300 mg per day.  Always check the nutrition information of foods labeled as "low-fat" or "nonfat". These foods may be higher in added sugar or refined carbohydrates and should be avoided.  Talk to your dietitian to identify your daily goals for nutrients listed on the label. Shopping  Avoid buying canned, premade, or processed foods. These foods tend to be high in fat, sodium, and added sugar.  Shop around the outside edge of the grocery store. This includes fresh fruits and vegetables, bulk grains, fresh meats, and fresh dairy. Cooking  Use low-heat cooking methods, such as baking, instead of high-heat cooking methods like deep frying.  Cook using healthy oils, such as olive, canola, or sunflower oil.  Avoid cooking with butter, cream, or high-fat meats. Meal planning  Eat meals and snacks regularly, preferably at the same times every day. Avoid going long periods of time without eating.  Eat foods high in fiber, such as fresh fruits, vegetables, beans, and whole grains. Talk to your dietitian about how many servings of carbohydrates you can eat at each meal.  Eat 4-6 ounces of lean protein each day, such as lean meat, chicken, fish, eggs, or tofu. 1 ounce is equal to 1 ounce of meat, chicken, or fish, 1 egg, or 1/4 cup of tofu.  Eat some foods each day that contain healthy fats, such as avocado, nuts, seeds, and fish. Lifestyle   Check your blood glucose regularly.  Exercise at least 30 minutes 5 or more days each week, or as told by your health care provider.  Take medicines as told by your health care provider.  Do not use any products that contain nicotine or tobacco, such as cigarettes and e-cigarettes. If you need help quitting, ask your health care provider.  Work with a Social worker or diabetes educator to  identify strategies to manage stress and any emotional and social challenges. What are some questions to ask my health care provider?  Do I need to meet with a diabetes educator?  Do I need to meet with a dietitian?  What number can I call if I have questions?  When are the best times to check my blood glucose? Where to find more information:  American Diabetes Association: diabetes.org/food-and-fitness/food  Academy of Nutrition and Dietetics: PokerClues.dk  [employer] of Diabetes and Digestive and Kidney Diseases (NIH): ContactWire.be Summary  A healthy meal plan will help you control your blood glucose and maintain a healthy lifestyle.  Working with a diet and nutrition specialist (dietitian) can help you make a meal plan that is best for you.  Keep in mind that carbohydrates and alcohol have immediate effects on your blood glucose levels. It is important to count carbohydrates and to use alcohol carefully. This information is not intended to replace advice given to you by your health care provider. Make sure you discuss any questions you have with your health care  provider. Document Released: 05/28/2005 Document Revised: 10/05/2016 Document Reviewed: 10/05/2016 Elsevier Interactive Patient Education  [employer].

## 2018-01-11 NOTE — Telephone Encounter (Signed)
Pt has been notified of Myoview results by phone with verbal understanding. Pt thanked me my call.

## 2018-01-11 NOTE — Assessment & Plan Note (Signed)
New diagnosis. Reviewed pathophysiology of diabetes as well as monitoring parameters and management options. Will refer to diabetes education. I asked her to check with pharmacy for preferred glucometer brand and then will send to start checking routinely at home. Reviewed timing of sugar checks as well as fasting and postprandial goal ranges. Start metformin 500mg  bid - reviewed side effects to expect whenever starting or titrating med. RTC 3 wks close f/u visit. Pt agrees with plan.

## 2018-01-11 NOTE — Telephone Encounter (Signed)
Follow up  ° ° °Patient is returning call.  °

## 2018-01-11 NOTE — Assessment & Plan Note (Addendum)
Continue plavix , statin. Discussed increased CV risk with diabetes.

## 2018-01-12 ENCOUNTER — Telehealth: Payer: Self-pay | Admitting: Family Medicine

## 2018-01-12 MED ORDER — ONETOUCH ULTRA 2 W/DEVICE KIT: 1.0000 | PACK | Freq: Every day | 0 refills | Status: DC

## 2018-01-12 MED ORDER — ONETOUCH DELICA LANCETS 33G MISC: 1.0000 | Freq: Every day | 0 refills | Status: DC

## 2018-01-12 MED ORDER — GLUCOSE BLOOD VI STRP: 1.0000 | ORAL_STRIP | Freq: Every day | 0 refills | Status: DC

## 2018-01-12 NOTE — Telephone Encounter (Signed)
Spoke with pt relaying instructions per Dr. G.  Pt verbalizes understanding.  

## 2018-01-12 NOTE — Addendum Note (Signed)
Addended by: Brenton Grills on: 9/0/1222 41:14 PM   Modules accepted: Orders

## 2018-01-12 NOTE — Telephone Encounter (Signed)
Spoke with [facility] to find out which OneTouch model the pt needs.  Told it is the OneTouch Ultra.  Sent rx for OneTouch Ultra 2 meter, OneTouch Ultra blue strips and OneTouch Delica lancets.

## 2018-01-12 NOTE — Telephone Encounter (Signed)
Copied from [place] [id number]. Topic: Quick Communication - Rx Refill/Question >> Jan 12, 2018 10:05 AM Synthia Innocent wrote: Medication: One Touch lancets and test strips Has the patient contacted their pharmacy? Yes.   (Agent: If no, request that the patient contact the pharmacy for the refill.) Preferred Pharmacy (with phone number or street name): [facility] [place] Agent: Please be advised that RX refills may take up to 3 business days. We ask that you follow-up with your pharmacy.

## 2018-01-12 NOTE — Telephone Encounter (Signed)
One Touch  lancets and test strips   I don't see where she is using these.   I see where she is a newly diagnosed diabetic though.    Pt of Dr. Danise Mina.  [facility] [place], [place]

## 2018-01-20 NOTE — Progress Notes (Signed)
Cardiology Office Note:    Date:  01/21/2018   ID:  Karen Osborne, DOB 13-Jun-1956, MRN [id number]  PCP:  [name], MD  Cardiologist:  Sherren Mocha, MD   Referring MD: [name], MD   Chief Complaint  Patient presents with  . Follow-up    shortness of breath    History of Present Illness:    Karen Osborne is a 62 y.o. female with  CAD s/p NSTEMI 01/2012 tx with DES to mid LAD, HTN, HL, tobacco abuse.  Echocardiogram in July 2018 demonstrated normal LV function with moderate diastolic dysfunction.  She was last seen 01/07/2018 with complaints of worsening dyspnea with exertion.  Chest x-ray, BNP were both normal.  Nuclear stress test also demonstrated normal perfusion.  Of note, her labs did indicate a markedly elevated glucose and she was seen by primary care for further management of diabetes (new diagnosis).  Karen Osborne returns for follow-up.  She continues to note shortness of breath with activity.  She also notes a cough that is fairly chronic.  She has noted some increased wheezing since stopping HCTZ.  I held this when her labs came back with elevated creatinine and markedly elevated glucose.  She denies chest discomfort, PND, edema, syncope.  Prior CV studies:   The following studies were reviewed today:  Nuclear stress test 01/07/2018 EF 74, tiny apicoseptal fixed defect unchanged from 2015 (likely attenuation artifact), normal perfusion, low risk  Echo 03/16/2017 Mild LVH, EF 63-84, grade 2 diastolic dysfunction, normal wall motion  Myoview (7/15): Fixed apical defect (breast attenuation versus scar), no ischemia, EF 64%; low risk  LHC (01/27/12): prox LAD 20%, mid LAD 99%,  prox OM 30%,  prox RCA 30%, mid RCA 50%, dist RCA 30%,  EF 60%. PCI:2.25 x 16 mm Promus Element DES to mid LAD.  Echo (01/28/12): Mild LVH, EF 55%, apical septum and true apex severe HK, Gr 2 DD, mild LAE, normal RVF  Past Medical History:  Diagnosis Date  .  Coronary artery disease    a. s/p NSTEMI - LHC 01/27/12: prox LAD 20%, mid LAD 99%, prox OM 30%, prox RCA 30%, mid RCA 50%, dist RCA 30%, EF 60%. PCI: 2.25 x 16 mm Promus Element DES to mid LAD //  b. Myoview (7/15): Fixed apical defect (breast attenuation versus scar), no ischemia, EF 64%; low risk   . Depression   . Glucose intolerance (impaired glucose tolerance)    A1C 6.1 01/2012  . History of echocardiogram    Echo (01/28/12): Mild LVH, EF 55%, apical septum and true apex severe HK, Gr 2 DD, mild LAE, normal RVF // Echo 7/18: mild LVH, EF 60-65, no RWMA, Gr 2 DD, normal RVF  . History of nuclear stress test    Nuclear stress test 4/19: EF 74, apicoseptal defect c/w atten artifact, normal perfusion; Low Risk  . Hypercholesteremia   . Hypertension   . NSVD (normal spontaneous vaginal delivery) 1988  . Osteopenia   . Restless leg syndrome   . Tobacco abuse    Surgical Hx: The patient  has a past surgical history that includes gyn surgery (02/1998); Abdominal hysterectomy; Coronary angioplasty with stent (01/27/2012); left heart catheterization with coronary angiogram (N/A, 01/27/2012); percutaneous coronary stent intervention (pci-s) (01/27/2012); and Colonoscopy (10/2006).   Current Medications: Current Meds  Medication Sig  . Blood Glucose Monitoring Suppl (ONE TOUCH ULTRA 2) w/Device KIT 1 each by Does not apply route daily. Use as directed to check sugar once  a day  . carvedilol (COREG) 6.25 MG tablet TAKE 1 TABLET BY MOUTH TWICE DAILY WITH A MEAL  . clopidogrel (PLAVIX) 75 MG tablet TAKE 1 TABLET BY MOUTH EVERY DAY  . glucose blood (ONE TOUCH ULTRA TEST) test strip 1 each by Other route daily. Use as instructed to check sugar once a day.  . Ibuprofen-Diphenhydramine HCl (ADVIL PM) 200-25 MG CAPS Take 1 capsule by mouth at bedtime as needed.  Marland Kitchen lisinopril (PRINIVIL,ZESTRIL) 20 MG tablet TAKE 1 TABLET (20 MG TOTAL) BY MOUTH DAILY.  . metFORMIN (GLUCOPHAGE) 500 MG tablet Take 1 tablet  (500 mg total) by mouth 2 (two) times daily with a meal. First week take once daily  . ONETOUCH DELICA LANCETS 16R MISC 1 each by Does not apply route daily. Use as directed to check sugar once a day.  Marland Kitchen rOPINIRole (REQUIP) 1 MG tablet TAKE 1-2 TABLETS BY MOUTH AT BEDTIME. NEEDS OFFICE VISIT  . rosuvastatin (CRESTOR) 40 MG tablet Take 1 tablet (40 mg total) by mouth daily. Please keep upcoming appt in April for future refills. Thank you     Allergies:   Penicillins   Social History   Tobacco Use  . Smoking status: Current Every Day Smoker    Packs/day: 0.50    Years: 38.00    Pack years: 19.00    Types: Cigarettes  . Smokeless tobacco: Never Used  Substance Use Topics  . Alcohol use: No  . Drug use: No     Family Hx: The patient's family history includes Diabetes in her sister; Heart attack in her father; Heart disease (age of onset: 69) in her father; Hypertension in her father; Prostate cancer in her father.  ROS:   Please see the history of present illness.    Review of Systems  Respiratory: Positive for cough and shortness of breath.   Psychiatric/Behavioral: Positive for depression.   All other systems reviewed and are negative.   EKGs/Labs/Other Test Reviewed:    EKG:  EKG is not ordered today.    Recent Labs: 06/25/2017: ALT 13 01/07/2018: BUN 15; Creatinine, Ser 1.11; Hemoglobin 14.6; NT-Pro BNP 20; Platelets 271; Potassium 4.0; Sodium 131   Recent Lipid Panel Lab Results  Component Value Date/Time   CHOL 123 06/25/2017 07:34 AM   TRIG 153 (H) 06/25/2017 07:34 AM   HDL 28 (L) 06/25/2017 07:34 AM   CHOLHDL 4.4 06/25/2017 07:34 AM   CHOLHDL 4 02/18/2015 07:46 AM   LDLCALC 64 06/25/2017 07:34 AM    Physical Exam:    VS:  BP 140/78   Pulse 80   Ht 5' 7"  (1.702 m)   Wt 191 lb (86.6 kg)   SpO2 95%   BMI 29.91 kg/m     Wt Readings from Last 3 Encounters:  01/21/18 191 lb (86.6 kg)  01/11/18 191 lb 8 oz (86.9 kg)  01/10/18 192 lb (87.1 kg)      Physical Exam  Constitutional: She is oriented to person, place, and time. She appears well-developed and well-nourished. No distress.  HENT:  Head: Normocephalic and atraumatic.  Neck: Neck supple. No JVD present.  Cardiovascular: Normal rate, regular rhythm, S1 normal and S2 normal. Exam reveals distant heart sounds.  No murmur heard. Pulmonary/Chest: Effort normal. She has decreased breath sounds. She has no wheezes. She has no rales.  Abdominal: Soft. There is no hepatomegaly.  Musculoskeletal: She exhibits no edema.  Neurological: She is alert and oriented to person, place, and time.  Skin: Skin is warm  and dry.    ASSESSMENT & PLAN:    Shortness of breath Recent echo with normal LV function.  Nuclear stress test was negative for ischemia.  I suspect her shortness of breath is mainly related to COPD.  She does have moderate diastolic dysfunction on echocardiogram.  She does not display signs of volume excess on exam.  Recent BNP was normal.  However, she has noted some increased wheezing since stopping hydrochlorothiazide.  Therefore, she may have a small component of diastolic heart failure contributing.  But her shortness of breath is mainly related to lung issues from smoking.  We discussed referral to pulmonology versus follow-up with primary care to further evaluate and manage COPD.  She prefers to follow-up with primary care.  -Discuss evaluation and management of COPD with PCP at next visit  Coronary artery disease involving native coronary artery of native heart without angina pectoris History of non-ST elevation microinfarction 2013 treated with a drug-eluting stent to the LAD.  Recent nuclear stress test negative for ischemia.  No further ischemic evaluation needed.  Continue beta-blocker, ACE inhibitor, statin, Plavix.  Essential hypertension Blood pressure somewhat elevated.  I have asked her to go ahead and resume hydrochlorthiazide 12.5 mg daily.  Obtain follow-up BMET  2 weeks.  TOBACCO ABUSE I have again recommended cessation.  As noted, she likely has COPD and will follow up with primary care for further evaluation and management.  Type 2 diabetes mellitus  ([place]) Continue follow-up with primary care.   Dispo:  Return in about 6 months (around 07/24/2018) for Routine Follow Up, w/ Dr. Burt Knack, or Richardson Dopp, PA-C.   Medication Adjustments/Labs and Tests Ordered: Current medicines are reviewed at length with the patient today.  Concerns regarding medicines are outlined above.  Tests Ordered: Orders Placed This Encounter  Procedures  . Basic Metabolic Panel (BMET)   Medication Changes: Meds ordered this encounter  Medications  . hydrochlorothiazide (MICROZIDE) 12.5 MG capsule    Sig: Take 1 capsule (12.5 mg total) by mouth daily.    Dispense:  90 capsule    Refill:  3    Signed, Richardson Dopp, PA-C  01/21/2018 9:24 AM    [facility] [address], [place], [place]  [address] Phone: [phone]; Fax: [phone]

## 2018-01-21 ENCOUNTER — Ambulatory Visit: Payer: BLUE CROSS/BLUE SHIELD | Admitting: Physician Assistant

## 2018-01-21 ENCOUNTER — Encounter: Payer: Self-pay | Admitting: Physician Assistant

## 2018-01-21 VITALS — BP 140/78 | HR 80 | Ht 67.0 in | Wt 191.0 lb

## 2018-01-21 DIAGNOSIS — E1169 Type 2 diabetes mellitus with other specified complication: Secondary | ICD-10-CM

## 2018-01-21 DIAGNOSIS — I10 Essential (primary) hypertension: Secondary | ICD-10-CM

## 2018-01-21 DIAGNOSIS — R0602 Shortness of breath: Secondary | ICD-10-CM | POA: Diagnosis not present

## 2018-01-21 DIAGNOSIS — F17200 Nicotine dependence, unspecified, uncomplicated: Secondary | ICD-10-CM

## 2018-01-21 DIAGNOSIS — I2510 Atherosclerotic heart disease of native coronary artery without angina pectoris: Secondary | ICD-10-CM

## 2018-01-21 MED ORDER — HYDROCHLOROTHIAZIDE 12.5 MG PO CAPS: 12.5000 mg | ORAL_CAPSULE | Freq: Every day | ORAL | 3 refills | Status: DC

## 2018-01-21 NOTE — Patient Instructions (Addendum)
Medication Instructions:  Restart HCTZ 12.5 mg Once daily   Labwork: In 2 weeks - BMET   Testing/Procedures: None   Follow-Up: Your physician wants you to follow-up in: 6 MONTHS WITH DR. [name] will receive a reminder letter in the mail two months in advance. If you don't receive a letter, please call our office to schedule the follow-up appointment.   Any Other Special Instructions Will Be Listed Below (If Applicable).  Discuss with [name], MD, at your next visit, evaluation and management of COPD. Continue to work on quitting smoking cigarettes.  If you need a refill on your cardiac medications before your next appointment, please call your pharmacy.

## 2018-01-27 ENCOUNTER — Ambulatory Visit: Payer: BLUE CROSS/BLUE SHIELD | Admitting: Dietician

## 2018-01-31 ENCOUNTER — Other Ambulatory Visit: Payer: Self-pay | Admitting: Family Medicine

## 2018-01-31 NOTE — Telephone Encounter (Signed)
Electronic refill request Last refill 01/03/18 #60 Last office visit 01/11/18

## 2018-02-02 ENCOUNTER — Ambulatory Visit: Payer: BLUE CROSS/BLUE SHIELD | Admitting: Family Medicine

## 2018-02-02 ENCOUNTER — Encounter: Payer: Self-pay | Admitting: Family Medicine

## 2018-02-02 VITALS — BP 118/68 | HR 80 | Temp 97.9°F | Ht 67.0 in | Wt 189.8 lb

## 2018-02-02 DIAGNOSIS — I2510 Atherosclerotic heart disease of native coronary artery without angina pectoris: Secondary | ICD-10-CM | POA: Diagnosis not present

## 2018-02-02 DIAGNOSIS — E1169 Type 2 diabetes mellitus with other specified complication: Secondary | ICD-10-CM

## 2018-02-02 DIAGNOSIS — F17200 Nicotine dependence, unspecified, uncomplicated: Secondary | ICD-10-CM | POA: Diagnosis not present

## 2018-02-02 DIAGNOSIS — R0602 Shortness of breath: Secondary | ICD-10-CM

## 2018-02-02 DIAGNOSIS — J984 Other disorders of lung: Secondary | ICD-10-CM | POA: Insufficient documentation

## 2018-02-02 MED ORDER — METFORMIN HCL 1000 MG PO TABS: 1000.0000 mg | ORAL_TABLET | Freq: Two times a day (BID) | ORAL | 1 refills | Status: DC

## 2018-02-02 NOTE — Progress Notes (Signed)
BP 118/68 (BP Location: Left Arm, Patient Position: Sitting, Cuff Size: Normal)   Pulse 80   Temp 97.9 F (36.6 C) (Oral)   Ht _0  (1.702 m)   Wt 189 lb 12 oz (86.1 kg)   SpO2 94%   BMI 29.72 kg/m    CC: 3 wk f/u visit DM Subjective:    Patient ID: Karen Osborne, female    DOB: 1956-08-24, 62 y.o.   MRN: [id number]  HPI: Karen Osborne is a 62 y.o. female presenting on 02/02/2018 for Diabetes (Here for 3 wk f/u.)   See prior note for details. Recent DM diagnosis. Here for close 3 wk f/u  DM - does regularly check sugars fasting in the morning 210-280. Compliant with antihyperglycemic regimen which includes: metformin 537m bid. Denies low sugars or hypoglycemic symptoms. Denies paresthesias. Last diabetic eye exam DUE. Pneumovax: 01/2012. Prevnar: not due. Glucometer brand: one touch Ultra. DSME: referred last visit - appt Friday afternoon.  Lab Results  Component Value Date   HGBA1C 12.7 01/11/2018   Diabetic Foot Exam - Simple   Simple Foot Form Diabetic Foot exam was performed with the following findings:  Yes 02/02/2018  5:03 PM  Visual Inspection No deformities, no ulcerations, no other skin breakdown bilaterally:  Yes Sensation Testing See comments:  Yes Pulse Check Posterior Tibialis and Dorsalis pulse intact bilaterally:  Yes Comments Diminished pulses bilaterally L sole with small corn vs wart    No results found for: MICROALBUR, [id number]   Ongoing dyspnea and cough s/p cardiac evaluation - thought COPD related. Echo showed mod DD. H/o NSTEMI 2013 s/p DES to LAD. Recent nuclear stress test negative for ischemia.   Smoking - trying to cut back on her own - not as enjoyable as previously. Down to 1/2 ppd.   Relevant past medical, surgical, family and social history reviewed and updated as indicated. Interim medical history since our last visit reviewed. Allergies and medications reviewed and updated. Outpatient Medications Prior to Visit  Medication Sig  Dispense Refill  . Blood Glucose Monitoring Suppl (ONE TOUCH ULTRA 2) w/Device KIT 1 each by Does not apply route daily. Use as directed to check sugar once a day 1 each 0  . carvedilol (COREG) 6.25 MG tablet TAKE 1 TABLET BY MOUTH TWICE DAILY WITH A MEAL 180 tablet 3  . clopidogrel (PLAVIX) 75 MG tablet TAKE 1 TABLET BY MOUTH EVERY DAY 90 tablet 0  . glucose blood (ONE TOUCH ULTRA TEST) test strip 1 each by Other route daily. Use as instructed to check sugar once a day. 100 each 0  . hydrochlorothiazide (MICROZIDE) 12.5 MG capsule Take 1 capsule (12.5 mg total) by mouth daily. 90 capsule 3  . Ibuprofen-Diphenhydramine HCl (ADVIL PM) 200-25 MG CAPS Take 1 capsule by mouth at bedtime as needed.    .Marland Kitchenlisinopril (PRINIVIL,ZESTRIL) 20 MG tablet TAKE 1 TABLET (20 MG TOTAL) BY MOUTH DAILY. 90 tablet 2  . ONETOUCH DELICA LANCETS 396QMISC 1 each by Does not apply route daily. Use as directed to check sugar once a day. 100 each 0  . rOPINIRole (REQUIP) 1 MG tablet TAKE 1-2 TABLETS BY MOUTH AT BEDTIME. NEEDS OFFICE VISIT 60 tablet 0  . rosuvastatin (CRESTOR) 40 MG tablet Take 1 tablet (40 mg total) by mouth daily. Please keep upcoming appt in April for future refills. Thank you 90 tablet 0  . metFORMIN (GLUCOPHAGE) 500 MG tablet Take 1 tablet (500 mg total) by mouth 2 (two) times  daily with a meal. First week take once daily 180 tablet 1  . nitroGLYCERIN (NITROSTAT) 0.4 MG SL tablet Place 1 tablet (0.4 mg total) under the tongue every 5 (five) minutes as needed for chest pain (up to 3 doses). 25 tablet 3   No facility-administered medications prior to visit.      Per HPI unless specifically indicated in ROS section below Review of Systems     Objective:    BP 118/68 (BP Location: Left Arm, Patient Position: Sitting, Cuff Size: Normal)   Pulse 80   Temp 97.9 F (36.6 C) (Oral)   Ht _0  (1.702 m)   Wt 189 lb 12 oz (86.1 kg)   SpO2 94%   BMI 29.72 kg/m   Wt Readings from Last 3 Encounters:    02/02/18 189 lb 12 oz (86.1 kg)  01/21/18 191 lb (86.6 kg)  01/11/18 191 lb 8 oz (86.9 kg)    Physical Exam  Constitutional: She appears well-developed and well-nourished. No distress.  HENT:  Mouth/Throat: Oropharynx is clear and moist. No oropharyngeal exudate.  Eyes: Pupils are equal, round, and reactive to light. Conjunctivae and EOM are normal. No scleral icterus.  Neck: Normal range of motion. Neck supple.  Cardiovascular: Normal rate, regular rhythm, normal heart sounds and intact distal pulses.  No murmur heard. Pulmonary/Chest: Effort normal and breath sounds normal. No respiratory distress. She has no wheezes. She has no rales.  Lungs largely clear  Musculoskeletal: She exhibits no edema.  See HPI for foot exam if done  Lymphadenopathy:    She has no cervical adenopathy.  Skin: Skin is warm and dry. No rash noted.  Psychiatric: She has a normal mood and affect.  Nursing note and vitals reviewed.   Lab Results  Component Value Date   CHOL 123 06/25/2017   HDL 28 (L) 06/25/2017   LDLCALC 64 06/25/2017   TRIG 153 (H) 06/25/2017   CHOLHDL 4.4 06/25/2017      Assessment & Plan:  Also due for physical - will schedule this later in the year Problem List Items Addressed This Visit    CAD (coronary artery disease)   Dyspnea    Ongoing. Cards eval reviewed - largely attributed to COPD. rec spirometry to further assess, encouraged full smoking cessation. Pt desires to defer spirometry to future visit.       TOBACCO ABUSE    Contemplative. Continue to encourage cessation.       Type 2 diabetes mellitus with other specified complication (HCC) - Primary    Improving sugar readings - 200s range in place of 400s. Will continue titrating metformin - has tolerated well to date. We again reviewed difference between type 1 and 2 DM. Foot exam today.  Has DSME scheduled this Friday. Encouraged schedule eye exam as due.       Relevant Medications   metFORMIN (GLUCOPHAGE) 1000  MG tablet       Meds ordered this encounter  Medications  . metFORMIN (GLUCOPHAGE) 1000 MG tablet    Sig: Take 1 tablet (1,000 mg total) by mouth 2 (two) times daily with a meal.    Dispense:  180 tablet    Refill:  1    Note new sig/dose - hold until next refill needed   No orders of the defined types were placed in this encounter.   Follow up plan: Return in about 6 weeks (around 03/16/2018) for follow up visit.  [name], MD

## 2018-02-02 NOTE — Patient Instructions (Addendum)
Schedule eye exam.  Increase metformin to 1 in the morning, 2 at night for 2 weeks then increase to 2 in am and 2 at night.  Continue checking sugars. Return in 4-6 weeks for follow up diabetes and spirometry. Visit after this will be physical.

## 2018-02-02 NOTE — Assessment & Plan Note (Signed)
Ongoing. Cards eval reviewed - largely attributed to COPD. rec spirometry to further assess, encouraged full smoking cessation. Pt desires to defer spirometry to future visit.

## 2018-02-02 NOTE — Assessment & Plan Note (Signed)
Contemplative. Continue to encourage cessation.

## 2018-02-02 NOTE — Assessment & Plan Note (Signed)
Improving sugar readings - 200s range in place of 400s. Will continue titrating metformin - has tolerated well to date. We again reviewed difference between type 1 and 2 DM. Foot exam today.  Has DSME scheduled this Friday. Encouraged schedule eye exam as due.

## 2018-02-04 ENCOUNTER — Encounter: Payer: Self-pay | Admitting: Dietician

## 2018-02-04 ENCOUNTER — Other Ambulatory Visit: Payer: BLUE CROSS/BLUE SHIELD | Admitting: *Deleted

## 2018-02-04 ENCOUNTER — Telehealth: Payer: Self-pay | Admitting: *Deleted

## 2018-02-04 ENCOUNTER — Encounter: Payer: BLUE CROSS/BLUE SHIELD | Attending: Family Medicine | Admitting: Dietician

## 2018-02-04 VITALS — Ht 67.0 in | Wt 188.9 lb

## 2018-02-04 DIAGNOSIS — I10 Essential (primary) hypertension: Secondary | ICD-10-CM | POA: Diagnosis not present

## 2018-02-04 DIAGNOSIS — I2510 Atherosclerotic heart disease of native coronary artery without angina pectoris: Secondary | ICD-10-CM

## 2018-02-04 DIAGNOSIS — R0602 Shortness of breath: Secondary | ICD-10-CM

## 2018-02-04 DIAGNOSIS — E119 Type 2 diabetes mellitus without complications: Secondary | ICD-10-CM

## 2018-02-04 DIAGNOSIS — E7800 Pure hypercholesterolemia, unspecified: Secondary | ICD-10-CM | POA: Diagnosis not present

## 2018-02-04 LAB — BASIC METABOLIC PANEL
BUN/Creatinine Ratio: 13 (ref 12–28)
BUN: 14 mg/dL (ref 8–27)
CO2: 23 mmol/L (ref 20–29)
Calcium: 9.9 mg/dL (ref 8.7–10.3)
Chloride: 97 mmol/L (ref 96–106)
Creatinine, Ser: 1.07 mg/dL — ABNORMAL HIGH (ref 0.57–1.00)
GFR calc Af Amer: 65 mL/min/{1.73_m2} (ref >59)
GFR calc non Af Amer: 56 mL/min/{1.73_m2} — ABNORMAL LOW (ref >59)
Glucose: 194 mg/dL — ABNORMAL HIGH (ref 65–99)
Potassium: 4.1 mmol/L (ref 3.5–5.2)
Sodium: 137 mmol/L (ref 134–144)

## 2018-02-04 LAB — ALT: ALT: 16 IU/L (ref 0–32)

## 2018-02-04 NOTE — Progress Notes (Signed)
Medical Nutrition Therapy: Visit start time: 1937  end time: 1630  Assessment:  Diagnosis: DM type II Past medical history: HLD, HTN, CAD Psychosocial issues/ stress concerns: dx of anxiety and depression Preferred learning method:  . Auditory  Current weight: 188.9#  Height: 5\' 7"  Medications, supplements: Vitamin D3, Lisinopril, Rosuvastatin, HCTZ, Carvedilol, One Touch, Meformin, Ropinirole, Nitroglycerine  Progress and evaluation: Patient with new dx of DM type II (x4wks). Her sister is a diabetic but outside of this she has had no exposure to nutrition education for DM management. Currently she checks her BG once daily in the morning before breakfast, which currently runs in the low 200s, down from high 400s when diagnosed. Changes to her diet include reducing the amount of sweets consumed, broiling meats more often than frying and becoming more conscious of the amount of CHO that foods contain by reading the nutrition facts label. Traditionally her diet has been heavy in fried foods. Her dinner meal is usually later in the evenings.   Physical activity: None at this time  Dietary Intake:  Usual eating pattern includes 3 meals and 1 snacks per day. Dining out frequency: 5 meals per week.  Breakfast: breakfast bowl (egg, meat, potatoes) or McD[employer]ds sausage and cheese muffin Snack: 2 halo oranges Lunch: out to eat typically: chicken salad on lettuce, chick-fil-a grilled chicken sandwich - may eat without the bun + french fries Snack: none Supper: meat + salad or meat + vegetable lately Snack: none Beverages: Diet Mt. Dew, water  Nutrition Care Education: Topics covered: foods that contain carbohydrates, appropriate portion sizes for all food groups and for standard CHO servings, simple vs. Complex CHO, eating out with DM type II Basic nutrition: basic food groups, appropriate nutrient balance, appropriate meal and snack schedule, general nutrition guidelines    Weight control:  behavioral changes for weight loss Advanced nutrition: cooking techniques, dining out, food label reading Diabetes:  goals for BGs, appropriate meal and snack schedule, Carb counting, appropriate carb intake and balance Other lifestyle changes: benefits of making changes, increasing motivation, readiness for change, identifying habits that need to change  Nutritional Diagnosis:  [place]-2.1 Inpaired nutrition utilization As related to dx of DM type II.  As evidenced by new dx, BG readings in the 200s, reliance on medication for BG control.  Intervention: Discussion as noted above. Patient will start to become familiar with counting CHO and serving sizes for different food groups, particularly CHO. She will continue to check her BG in the mornings and start to create a regular eating schedule for herself to best manage her blood sugars. Ideally she will choose snack foods with 1 serv CHO + 1 serv PRO. She plans to consume less fried food overall as well.  Education Materials given:  . General diet guidelines for Diabetes . Food lists/ Planning A Balanced Meal . Baking, Broiling, Grilling cooking handout . Goals/ instructions  Learner/ who was taught:  . Patient   Level of understanding: . Partial understanding; needs review/ practice  Demonstrated degree of understanding via:   Teach back Learning barriers: . None  Willingness to learn/ readiness for change: . Acceptance, ready for change  Monitoring and Evaluation:  Dietary intake, exercise, blood glucose readings, and body weight      follow up: prn: interested in attending diabetes classes at [facility]

## 2018-02-04 NOTE — Telephone Encounter (Signed)
Left message to go over lab results. DPR on file ok to lmom. Lmom lab work ok, no changes to be made with medications. Any questions please call the office [phone].

## 2018-02-04 NOTE — Patient Instructions (Signed)
   Start to familiarize yourself with counting carbohydrates, proper portion sizes for different food groups, and what servings of carbohydrates look like in different forms  Stick to 3 servings of carbs for each meal and 1-2 for one to two snacks per day to start  Eat on a regular schedule and try not to skip meals. Always have a snack on hand that is a combination of carbohydrate and protein to avoid periods of low blood sugar  Use the plate method when planning out meals (1/2 plate non-starchy vegetables, 1/4 plate protein 1/4 plate starch/carb)

## 2018-02-04 NOTE — Telephone Encounter (Signed)
-----   Message from Liliane Shi, [place] sent at 02/04/2018  3:44 PM EDT ----- Renal function stable.  The potassium and ALT are normal. Continue current medications and follow up as planned.  Richardson Dopp, PA-C    02/04/2018 3:44 PM

## 2018-03-07 ENCOUNTER — Other Ambulatory Visit: Payer: Self-pay | Admitting: Physician Assistant

## 2018-03-07 DIAGNOSIS — E7800 Pure hypercholesterolemia, unspecified: Secondary | ICD-10-CM

## 2018-03-11 ENCOUNTER — Other Ambulatory Visit: Payer: Self-pay | Admitting: Cardiology

## 2018-03-11 DIAGNOSIS — I10 Essential (primary) hypertension: Secondary | ICD-10-CM

## 2018-03-15 ENCOUNTER — Ambulatory Visit: Payer: BLUE CROSS/BLUE SHIELD | Admitting: Family Medicine

## 2018-03-15 ENCOUNTER — Encounter: Payer: Self-pay | Admitting: Family Medicine

## 2018-03-15 VITALS — BP 124/76 | HR 79 | Temp 97.9°F | Ht 67.0 in | Wt 184.8 lb

## 2018-03-15 DIAGNOSIS — R0602 Shortness of breath: Secondary | ICD-10-CM

## 2018-03-15 DIAGNOSIS — I10 Essential (primary) hypertension: Secondary | ICD-10-CM | POA: Diagnosis not present

## 2018-03-15 DIAGNOSIS — E1169 Type 2 diabetes mellitus with other specified complication: Secondary | ICD-10-CM

## 2018-03-15 DIAGNOSIS — F17200 Nicotine dependence, unspecified, uncomplicated: Secondary | ICD-10-CM

## 2018-03-15 MED ORDER — ALBUTEROL SULFATE (2.5 MG/3ML) 0.083% IN NEBU
2.5000 mg | INHALATION_SOLUTION | Freq: Once | RESPIRATORY_TRACT | Status: AC
  Administered 2018-03-15: 2.5 mg via RESPIRATORY_TRACT

## 2018-03-15 NOTE — Assessment & Plan Note (Signed)
Continue to encourage cessation. Baseline spirometry today

## 2018-03-15 NOTE — Assessment & Plan Note (Signed)
S/p reassuring cards eval. Will check baseline spirometry to eval for COPD. Lungs clear today.

## 2018-03-15 NOTE — Patient Instructions (Addendum)
Schedule diabetic eye exam. Continue current medicines.  Fructosamine check today. Return in 1 month for lab visit only to check A1c.  Spirometry today and we will be in touch with results. Return for physical at next visit (2-3 months)

## 2018-03-15 NOTE — Assessment & Plan Note (Addendum)
Chronic, stable. Continue current regimen (lisinopril 20mg  daily)

## 2018-03-15 NOTE — Assessment & Plan Note (Signed)
DSME at [facility] 01/2018 - had 1 of 3 sessions - unaffordable to complete.  Improved readings noted by patient with frequent checks. Check fructosamine level today then return in 1 month for A1c. I also asked her to schedule CPE in 2-3 months.

## 2018-03-15 NOTE — Progress Notes (Signed)
BP 124/76 (BP Location: Left Arm, Patient Position: Sitting, Cuff Size: Normal)   Pulse 79   Temp 97.9 F (36.6 C) (Oral)   Ht 5' 7"  (1.702 m)   Wt 184 lb 12 oz (83.8 kg)   SpO2 96%   BMI 28.94 kg/m    CC: 6 wk f/u visit Subjective:    Patient ID: Karen Osborne, female    DOB: [birth year], 62 y.o.   MRN: [id number]  HPI: Karen Osborne is a 62 y.o. female presenting on 03/15/2018 for 6 wk follow up   DM - does regularly check sugars fasting daily 140 in am, then occasionally low 100s. Compliant with antihyperglycemic regimen which includes: metformin 1064m bid. Denies low sugars or hypoglycemic symptoms. Denies paresthesias. Last diabetic eye exam DUE. Pneumovax: 01/2012. Prevnar: not due. Glucometer brand: one touch ultra. DSME: had initial diabetes class and saw nutritionist - but each class was $400 so unaffordable. Lab Results  Component Value Date   HGBA1C 12.7 01/11/2018   Diabetic Foot Exam - Simple   No data filed     No results found for: MDerl [place]   COPD - requests spirometry today Smoking - down 1/2 ppd  Relevant past medical, surgical, family and social history reviewed and updated as indicated. Interim medical history since our last visit reviewed. Allergies and medications reviewed and updated. Outpatient Medications Prior to Visit  Medication Sig Dispense Refill  . Blood Glucose Monitoring Suppl (ONE TOUCH ULTRA 2) w/Device KIT 1 each by Does not apply route daily. Use as directed to check sugar once a day 1 each 0  . carvedilol (COREG) 6.25 MG tablet TAKE 1 TABLET BY MOUTH TWICE DAILY WITH A MEAL 180 tablet 3  . clopidogrel (PLAVIX) 75 MG tablet TAKE 1 TABLET BY MOUTH EVERY DAY 90 tablet 0  . glucose blood (ONE TOUCH ULTRA TEST) test strip 1 each by Other route daily. Use as instructed to check sugar once a day. 100 each 0  . hydrochlorothiazide (MICROZIDE) 12.5 MG capsule Take 1 capsule (12.5 mg total) by mouth daily. 90 capsule 3  .  Ibuprofen-Diphenhydramine HCl (ADVIL PM) 200-25 MG CAPS Take 1 capsule by mouth at bedtime as needed.    .Marland Kitchenlisinopril (PRINIVIL,ZESTRIL) 20 MG tablet TAKE 1 TABLET BY MOUTH EVERY DAY. NEED APPT FOR FURTHER REFILLS 90 tablet 3  . metFORMIN (GLUCOPHAGE) 1000 MG tablet Take 1 tablet (1,000 mg total) by mouth 2 (two) times daily with a meal. 180 tablet 1  . ONETOUCH DELICA LANCETS 367SMISC 1 each by Does not apply route daily. Use as directed to check sugar once a day. 100 each 0  . rOPINIRole (REQUIP) 1 MG tablet Take 1-2 tablets (1-2 mg total) by mouth at bedtime. 60 tablet 3  . rosuvastatin (CRESTOR) 40 MG tablet Take 1 tablet (40 mg total) by mouth daily. 90 tablet 3  . Vitamin D, Ergocalciferol, (DRISDOL) 50000 units CAPS capsule Take 50,000 Units by mouth every 7 (seven) days.    . nitroGLYCERIN (NITROSTAT) 0.4 MG SL tablet Place 1 tablet (0.4 mg total) under the tongue every 5 (five) minutes as needed for chest pain (up to 3 doses). 25 tablet 3   No facility-administered medications prior to visit.      Per HPI unless specifically indicated in ROS section below Review of Systems     Objective:    BP 124/76 (BP Location: Left Arm, Patient Position: Sitting, Cuff Size: Normal)   Pulse 79  Temp 97.9 F (36.6 C) (Oral)   Ht 5' 7"  (1.702 m)   Wt 184 lb 12 oz (83.8 kg)   SpO2 96%   BMI 28.94 kg/m   Wt Readings from Last 3 Encounters:  03/15/18 184 lb 12 oz (83.8 kg)  02/04/18 188 lb 14.4 oz (85.7 kg)  02/02/18 189 lb 12 oz (86.1 kg)    Physical Exam  Constitutional: She appears well-developed and well-nourished. No distress.  HENT:  Head: Normocephalic and atraumatic.  Right Ear: External ear normal.  Left Ear: External ear normal.  Nose: Nose normal.  Mouth/Throat: Oropharynx is clear and moist. No oropharyngeal exudate.  Eyes: Pupils are equal, round, and reactive to light. Conjunctivae and EOM are normal. No scleral icterus.  Neck: Normal range of motion. Neck supple.    Cardiovascular: Normal rate, regular rhythm, normal heart sounds and intact distal pulses.  No murmur heard. Pulmonary/Chest: Effort normal and breath sounds normal. No respiratory distress. She has no wheezes. She has no rales.  Musculoskeletal: She exhibits no edema.  See HPI for foot exam if done  Lymphadenopathy:    She has no cervical adenopathy.  Skin: Skin is warm and dry. No rash noted.  Psychiatric: She has a normal mood and affect.  Nursing note and vitals reviewed.     Assessment & Plan:   Problem List Items Addressed This Visit    Type 2 diabetes mellitus with other specified complication ([place]) - Primary    DSME at [facility] 01/2018 - had 1 of 3 sessions - unaffordable to complete.  Improved readings noted by patient with frequent checks. Check fructosamine level today then return in 1 month for A1c. I also asked her to schedule CPE in 2-3 months.       Relevant Orders   Fructosamine   POCT glycosylated hemoglobin (Hb A1C)   TOBACCO ABUSE    Continue to encourage cessation. Baseline spirometry today      Essential hypertension    Chronic, stable. Continue current regimen (lisinopril 66m daily)      Dyspnea    S/p reassuring cards eval. Will check baseline spirometry to eval for COPD. Lungs clear today.       Relevant Orders   PR EVAL OF BRONCHOSPASM       No orders of the defined types were placed in this encounter.  Orders Placed This Encounter  Procedures  . Fructosamine  . POCT glycosylated hemoglobin (Hb A1C)    Standing Status:   Future    Standing Expiration Date:   06/15/2018  . PR EVAL OF BRONCHOSPASM    Follow up plan: Return in about 3 months (around 06/15/2018) for annual exam, prior fasting for blood work.  JRia Bush MD

## 2018-03-15 NOTE — Addendum Note (Signed)
Addended by: Brenton Grills on: 10/17/3359 22:44 AM   Modules accepted: Orders

## 2018-03-18 LAB — FRUCTOSAMINE: Fructosamine: 211 umol/L (ref 190–270)

## 2018-03-19 ENCOUNTER — Encounter: Payer: Self-pay | Admitting: Family Medicine

## 2018-03-21 ENCOUNTER — Telehealth: Payer: Self-pay | Admitting: Family Medicine

## 2018-03-21 DIAGNOSIS — J984 Other disorders of lung: Secondary | ICD-10-CM

## 2018-03-21 NOTE — Telephone Encounter (Signed)
plz call patient to see if she's willing for pulm referral.  From my mychart message: Karen Osborne,  Your lung function test was not really consistent with COPD but rather restrictive lung disease - which means you weren't able to fill your lungs as well as we would have expected. There was not significant improvement with albuterol inhaler treatment.  For this we usually suggest referral to a lung doctor for further evaluation - let me know if you're willing to see the pulmonologist.  Have a good weekend,  Dr Danise Mina

## 2018-03-21 NOTE — Telephone Encounter (Signed)
Copied from [place] [id number]. Topic: Inquiry >> Mar 21, 2018  2:41 PM Karen Osborne wrote: Reason for CRM: pt called to ask results of her spirometry test. I also reset her mychart password as she was unable to get in but will check her lab results online. Please call pt.

## 2018-03-22 NOTE — Telephone Encounter (Signed)
Spoke with pt relaying Dr. Synthia Innocent message.  Pt verbalizes understanding and agrees to pulm referral.  Says she is now able to see her MyChart messages, fyi.

## 2018-03-23 NOTE — Telephone Encounter (Signed)
pulm referral placed.

## 2018-03-23 NOTE — Addendum Note (Signed)
Addended by: [name] on: 03/23/2018 08:37 AM   Modules accepted: Orders

## 2018-03-31 ENCOUNTER — Other Ambulatory Visit: Payer: Self-pay | Admitting: Cardiovascular Disease

## 2018-04-05 NOTE — Progress Notes (Addendum)
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 Possible restrictive lung disease.  -We will send for full pulmonary function test to look for evidence of restrictive lung disease. - We will start Tudorza inhaler for dyspnea and chronic cough. - We will check alpha 1 antitrypsin level, sent for 6-minute walk test. - And next visit will discuss preventive care including vaccines,  referral for lung cancer screening.   Date: 04/06/2018  MRN# [id number] Karen Osborne Dec 02, 1955  Referring Physician: Dr. Carlynn Spry is a 62 y.o. old female seen in consultation for chief complaint of:    Chief Complaint  Patient presents with  . Consult    SOB w/activity: prod cough w/clear mucus    HPI:   The patient is a 62 year old female smoker with COPD, RLS. She has a spiro at her doctors office which showed possible restrictive lung disease. She has been having dyspnea on exertion, she can walk 100 meters but then she will might have to sit down.  She does not take any inhalers, she does not feel limited by her breathing.  She denies reflux, or sinus drainage.  She does have a cough all the time, worse in the afternoon, she has a lot of congestion overall.  She occasionally hears gurgling at night, which is better with taking a water pill.   She is smoking about a half ppd, and she is thinking of quitting, she has not quit in the past, her son at home smoked.   **Chest x-ray 01/07/2018>> mild emphysematous changes. **Echocardiogram 03/16/2017>> EF equals 60%  **Spirometry 03/15/2018>> outside spirometry tracings personally reviewed.  Best FVC is 2.43 L, 66% predicted, best FEV1 is 1.93 L, 68% predicted.  Ratio 83%.  No significant improvement with bronchodilator.  Test shows moderate restrictive lung disease.   PMHX:   Past Medical History:  Diagnosis Date  . Coronary artery disease    a. s/p NSTEMI - LHC  01/27/12: prox LAD 20%, mid LAD 99%, prox OM 30%, prox RCA 30%, mid RCA 50%, dist RCA 30%, EF 60%. PCI: 2.25 x 16 mm Promus Element DES to mid LAD //  b. Myoview (7/15): Fixed apical defect (breast attenuation versus scar), no ischemia, EF 64%; low risk   . Depression   . Glucose intolerance (impaired glucose tolerance)    A1C 6.1 01/2012  . History of echocardiogram    Echo (01/28/12): Mild LVH, EF 55%, apical septum and true apex severe HK, Gr 2 DD, mild LAE, normal RVF // Echo 7/18: mild LVH, EF 60-65, no RWMA, Gr 2 DD, normal RVF  . History of nuclear stress test    Nuclear stress test 4/19: EF 74, apicoseptal defect c/w atten artifact, normal perfusion; Low Risk  . Hypercholesteremia   . Hypertension   . NSVD (normal spontaneous vaginal delivery) 1988  . Osteopenia   . Restless leg syndrome   . Tobacco abuse    Surgical Hx:  Past Surgical History:  Procedure Laterality Date  . ABDOMINAL HYSTERECTOMY    . COLONOSCOPY  10/2006   TA, [facility] (Dr Vira Agar)  . CORONARY ANGIOPLASTY WITH STENT PLACEMENT  01/27/2012   DES to LAD  . gyn surgery  02/1998   total hysterectomy, abn pap smear, no cancer  . LEFT HEART CATHETERIZATION WITH CORONARY ANGIOGRAM N/A 01/27/2012   Procedure: LEFT HEART CATHETERIZATION WITH CORONARY ANGIOGRAM;  Surgeon: Burnell Blanks, MD;  Location: [facility] CATH LAB;  Service: Cardiovascular;  Laterality: N/A;  . PERCUTANEOUS CORONARY STENT INTERVENTION (PCI-S)  01/27/2012   Procedure: PERCUTANEOUS CORONARY STENT INTERVENTION (PCI-S);  Surgeon: Burnell Blanks, MD;  Location: [facility] CATH LAB;  Service: Cardiovascular;;   Family Hx:  Family History  Problem Relation Age of Onset  . Prostate cancer Father   . Heart disease Father 49  . Hypertension Father   . Heart attack Father   . Diabetes Sister    Social Hx:   Social History   Tobacco Use  . Smoking status: Current Every Day Smoker    Packs/day: 0.50    Years: 38.00    Pack years: 19.00    Types:  Cigarettes  . Smokeless tobacco: Never Used  Substance Use Topics  . Alcohol use: No  . Drug use: No   Medication:    Current Outpatient Medications:  .  Blood Glucose Monitoring Suppl (ONE TOUCH ULTRA 2) w/Device KIT, 1 each by Does not apply route daily. Use as directed to check sugar once a day, Disp: 1 each, Rfl: 0 .  carvedilol (COREG) 6.25 MG tablet, TAKE 1 TABLET BY MOUTH TWICE DAILY WITH A MEAL, Disp: 180 tablet, Rfl: 3 .  clopidogrel (PLAVIX) 75 MG tablet, TAKE 1 TABLET BY MOUTH EVERY DAY, Disp: 90 tablet, Rfl: 2 .  glucose blood (ONE TOUCH ULTRA TEST) test strip, 1 each by Other route daily. Use as instructed to check sugar once a day., Disp: 100 each, Rfl: 0 .  hydrochlorothiazide (MICROZIDE) 12.5 MG capsule, Take 1 capsule (12.5 mg total) by mouth daily., Disp: 90 capsule, Rfl: 3 .  Ibuprofen-Diphenhydramine HCl (ADVIL PM) 200-25 MG CAPS, Take 1 capsule by mouth at bedtime as needed., Disp: , Rfl:  .  lisinopril (PRINIVIL,ZESTRIL) 20 MG tablet, TAKE 1 TABLET BY MOUTH EVERY DAY. NEED APPT FOR FURTHER REFILLS, Disp: 90 tablet, Rfl: 3 .  metFORMIN (GLUCOPHAGE) 1000 MG tablet, Take 1 tablet (1,000 mg total) by mouth 2 (two) times daily with a meal., Disp: 180 tablet, Rfl: 1 .  ONETOUCH DELICA LANCETS 81M MISC, 1 each by Does not apply route daily. Use as directed to check sugar once a day., Disp: 100 each, Rfl: 0 .  rOPINIRole (REQUIP) 1 MG tablet, Take 1-2 tablets (1-2 mg total) by mouth at bedtime., Disp: 60 tablet, Rfl: 3 .  rosuvastatin (CRESTOR) 40 MG tablet, Take 1 tablet (40 mg total) by mouth daily., Disp: 90 tablet, Rfl: 3 .  Vitamin D, Ergocalciferol, (DRISDOL) 50000 units CAPS capsule, Take 50,000 Units by mouth every 7 (seven) days., Disp: , Rfl:  .  nitroGLYCERIN (NITROSTAT) 0.4 MG SL tablet, Place 1 tablet (0.4 mg total) under the tongue every 5 (five) minutes as needed for chest pain (up to 3 doses)., Disp: 25 tablet, Rfl: 3   Allergies:  Penicillins  Review of  Systems: Gen:  Denies  fever, sweats, chills HEENT: Denies blurred vision, double vision. bleeds, sore throat Cvc:  No dizziness, chest pain. Resp:   Denies cough or sputum production, shortness of breath Gi: Denies swallowing difficulty, stomach pain. Gu:  Denies bladder incontinence, burning urine Ext:   No Joint pain, stiffness. Skin: No skin rash,  hives  Endoc:  No polyuria, polydipsia. Psych: No depression, insomnia. Other:  All other systems were reviewed with the patient and were negative other that what is mentioned in the HPI.   Physical Examination:   VS: BP 112/60 (BP Location: Left Arm, Cuff Size: Normal)   Pulse 83  Ht 5' 7"  (1.702 m)   Wt 185 lb (83.9 kg)   SpO2 96%   BMI 28.98 kg/m   General Appearance: No distress  Neuro:without focal findings,  speech normal,  HEENT: PERRLA, EOM intact.   Pulmonary: normal breath sounds, No wheezing.  CardiovascularNormal S1,S2.  No m/r/g.   Abdomen: Benign, Soft, non-tender. Renal:  No costovertebral tenderness  GU:  No performed at this time. Endoc: No evident thyromegaly, no signs of acromegaly. Skin:   warm, no rashes, no ecchymosis  Extremities: normal, no cyanosis, clubbing.  Other findings:    LABORATORY PANEL:   CBC No results for input(s): WBC, HGB, HCT, PLT in the last 168 hours. ------------------------------------------------------------------------------------------------------------------  Chemistries  No results for input(s): NA, K, CL, CO2, GLUCOSE, BUN, CREATININE, CALCIUM, MG, AST, ALT, ALKPHOS, BILITOT in the last 168 hours.  Invalid input(s): GFRCGP ------------------------------------------------------------------------------------------------------------------  Cardiac Enzymes No results for input(s): TROPONINI in the last 168 hours. ------------------------------------------------------------  RADIOLOGY:  No results found.     Thank  you for the consultation and for allowing [facility] to assist in the care of your patient. Our recommendations are noted above.  Please contact us if we can be of further service.   Marda Stalker, M.D., F.C.C.P.  Board Certified in Internal Medicine, Pulmonary Medicine, [place], and Sleep Medicine.  [facility] Office Number: [phone]   04/06/2018

## 2018-04-06 ENCOUNTER — Other Ambulatory Visit
Admission: RE | Admit: 2018-04-06 | Discharge: 2018-04-06 | Disposition: A | Discharge status: Home / Self Care | Payer: BLUE CROSS/BLUE SHIELD | Source: Ambulatory Visit | Attending: Internal Medicine | Admitting: Internal Medicine

## 2018-04-06 ENCOUNTER — Ambulatory Visit: Payer: BLUE CROSS/BLUE SHIELD | Admitting: Internal Medicine

## 2018-04-06 ENCOUNTER — Encounter: Payer: Self-pay | Admitting: Internal Medicine

## 2018-04-06 VITALS — BP 112/60 | HR 83 | Ht 67.0 in | Wt 185.0 lb

## 2018-04-06 DIAGNOSIS — J449 Chronic obstructive pulmonary disease, unspecified: Secondary | ICD-10-CM | POA: Insufficient documentation

## 2018-04-06 DIAGNOSIS — Z720 Tobacco use: Secondary | ICD-10-CM | POA: Diagnosis not present

## 2018-04-06 MED ORDER — ACLIDINIUM BROMIDE 400 MCG/ACT IN AEPB: 1.0000 | INHALATION_SPRAY | Freq: Two times a day (BID) | RESPIRATORY_TRACT | 5 refills | Status: DC

## 2018-04-06 NOTE — Patient Instructions (Addendum)
Will send you for lung function test, walk test, and genetic test for emphysema.   Follow up after tests are completed.   --Quitting smoking is the most important thing that you can do for your health.  --Quitting smoking will have greater affect on your health than any medicine that we can give you.

## 2018-04-07 ENCOUNTER — Telehealth: Payer: Self-pay | Admitting: *Deleted

## 2018-04-07 MED ORDER — TIOTROPIUM BROMIDE MONOHYDRATE 18 MCG IN CAPS: 18.0000 ug | ORAL_CAPSULE | Freq: Every day | RESPIRATORY_TRACT | 2 refills | Status: DC

## 2018-04-07 NOTE — Telephone Encounter (Signed)
Insurance denied Karen Osborne Patient must try and fail Spiriva handihaler/Spiriva Respimat. Please advise of change?

## 2018-04-07 NOTE — Telephone Encounter (Signed)
Can do spiriva handihaler once daily.

## 2018-04-07 NOTE — Telephone Encounter (Signed)
Rx sent to pharmacy  Pt aware of change Nothing further needed.

## 2018-04-11 LAB — ALPHA-1 ANTITRYPSIN PHENOTYPE: A-1 Antitrypsin, Ser: 144 mg/dL (ref 90–200)

## 2018-04-14 ENCOUNTER — Ambulatory Visit (INDEPENDENT_AMBULATORY_CARE_PROVIDER_SITE_OTHER): Payer: BLUE CROSS/BLUE SHIELD | Admitting: *Deleted

## 2018-04-14 DIAGNOSIS — J984 Other disorders of lung: Secondary | ICD-10-CM

## 2018-04-14 NOTE — Progress Notes (Signed)
SIX MIN WALK 04/14/2018  Medications Carvedilol 6.25 mg/vit D 50000/HCTZ 12.5 mg/Metformin 1000 mg/  Supplimental Oxygen during Test? (L/min) No  Laps 6  Partial Lap (in Meters) 60  Baseline BP (sitting) 110/60  Baseline Heartrate 87  Baseline Dyspnea (Borg Scale) 4  Baseline Fatigue (Borg Scale) 1  Baseline SPO2 98  BP (sitting) 150/60  Heartrate 102  Dyspnea (Borg Scale) 7  Fatigue (Borg Scale) 10  SPO2 100  BP (sitting) 124/60  Heartrate 90  SPO2 98  Stopped or Paused before Six Minutes No  Distance Completed 348  Tech Comments: Patient walked brisk walk without complaint of sob/dizziness/chest pain etc. She did not de-sat.     SMW performed today.

## 2018-04-15 ENCOUNTER — Other Ambulatory Visit: Payer: BLUE CROSS/BLUE SHIELD

## 2018-04-28 ENCOUNTER — Ambulatory Visit: Payer: BLUE CROSS/BLUE SHIELD

## 2018-04-28 ENCOUNTER — Ambulatory Visit: Payer: BLUE CROSS/BLUE SHIELD | Attending: Internal Medicine

## 2018-04-28 DIAGNOSIS — J449 Chronic obstructive pulmonary disease, unspecified: Secondary | ICD-10-CM | POA: Diagnosis not present

## 2018-04-28 MED ORDER — ALBUTEROL SULFATE (2.5 MG/3ML) 0.083% IN NEBU
2.5000 mg | INHALATION_SOLUTION | Freq: Once | RESPIRATORY_TRACT | Status: AC
  Administered 2018-04-28: 2.5 mg via RESPIRATORY_TRACT
  Filled 2018-04-28: qty 3

## 2018-05-04 NOTE — Progress Notes (Signed)
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 --Will stop spiriva which did not help, start Brio, given co-pay coupon. - referral for lung cancer screening.  Nicotine abuse. - Spent for medicine discussion, patient considering quitting, currently smoking half pack per day. - Given prescription for nicotine patches, discussed to quit plan.  She does not think she can do cold [place], therefore recommended she cut down by 1 cigarette/day.  Meds ordered this encounter  Medications  . fluticasone furoate-vilanterol (BREO ELLIPTA) 200-25 MCG/INH AEPB    Sig: Inhale 1 puff into the lungs daily. Rinse mouth after use.    Dispense:  1 each    Refill:  5  . nicotine (NICODERM CQ - DOSED IN MG/24 HOURS) 21 mg/24hr patch    Sig: Place 1 patch (21 mg total) onto the skin daily.    Dispense:  30 patch    Refill:  1   Return in about 6 months (around 11/05/2018).   Date: 05/04/2018  MRN# [id number] Karen Osborne Jan 20, 1956   Karen Osborne is a 62 y.o. old female seen in consultation for chief complaint of:    Chief Complaint  Patient presents with  . COPD    pt was started on Tudorza in which she has not had much benefit. She still has cough and sob with exertion.    HPI:   The patient is a 62 year old female smoker with COPD, RLS.  At last visit she was found to have mild emphysema, she was started on spiriva, but noticed no difference. She continues to have mild dyspnea on exertion.   She is smoking about a half ppd, and she is thinking of quitting, she has not quit in the past, her son at home smoked.   **Alpha-1 04/06/2018>> normal. **PFT 04/28/2018>> tracings personally reviewed.  FVC 86% predicted, FEV1 73% predicted, there is no significant improvement bronchodilator.  Ratio 68%.  Flow volume loop is normal. TLC is 83% predicted, ratio is normal.  DLCO is moderately reduced at 58%. - Overall this test shows  evidence of mild obstructive lung disease consistent with COPD. **6-minute walk test 04/14/2018>> patient walked 348 feet, lowest oxygen saturation at the end of test was 98%. **Chest x-ray 01/07/2018>> mild emphysematous changes. **Echocardiogram 03/16/2017>> EF equals 60% **Spirometry 03/15/2018>> outside spirometry tracings personally reviewed.  Best FVC is 2.43 L, 66% predicted, best FEV1 is 1.93 L, 68% predicted.  Ratio 83%.  No significant improvement with bronchodilator.  Test shows moderate restrictive lung disease.  Medication:    Current Outpatient Medications:  .  Aclidinium Bromide 400 MCG/ACT AEPB, Inhale 1 puff into the lungs 2 (two) times daily., Disp: 1 each, Rfl: 5 .  Blood Glucose Monitoring Suppl (ONE TOUCH ULTRA 2) w/Device KIT, 1 each by Does not apply route daily. Use as directed to check sugar once a day, Disp: 1 each, Rfl: 0 .  carvedilol (COREG) 6.25 MG tablet, TAKE 1 TABLET BY MOUTH TWICE DAILY WITH A MEAL, Disp: 180 tablet, Rfl: 3 .  clopidogrel (PLAVIX) 75 MG tablet, TAKE 1 TABLET BY MOUTH EVERY DAY, Disp: 90 tablet, Rfl: 2 .  glucose blood (ONE TOUCH ULTRA TEST) test strip, 1 each by Other route daily. Use as instructed to check sugar once a day., Disp: 100 each, Rfl: 0 .  hydrochlorothiazide (MICROZIDE) 12.5 MG capsule, Take 1 capsule (12.5 mg total) by mouth daily., Disp: 90 capsule, Rfl: 3 .  Ibuprofen-Diphenhydramine HCl (  ADVIL PM) 200-25 MG CAPS, Take 1 capsule by mouth at bedtime as needed., Disp: , Rfl:  .  lisinopril (PRINIVIL,ZESTRIL) 20 MG tablet, TAKE 1 TABLET BY MOUTH EVERY DAY. NEED APPT FOR FURTHER REFILLS, Disp: 90 tablet, Rfl: 3 .  metFORMIN (GLUCOPHAGE) 1000 MG tablet, Take 1 tablet (1,000 mg total) by mouth 2 (two) times daily with a meal., Disp: 180 tablet, Rfl: 1 .  nitroGLYCERIN (NITROSTAT) 0.4 MG SL tablet, Place 1 tablet (0.4 mg total) under the tongue every 5 (five) minutes as needed for chest pain (up to 3 doses)., Disp: 25 tablet, Rfl: 3 .  ONETOUCH  DELICA LANCETS 68H MISC, 1 each by Does not apply route daily. Use as directed to check sugar once a day., Disp: 100 each, Rfl: 0 .  rOPINIRole (REQUIP) 1 MG tablet, Take 1-2 tablets (1-2 mg total) by mouth at bedtime., Disp: 60 tablet, Rfl: 3 .  rosuvastatin (CRESTOR) 40 MG tablet, Take 1 tablet (40 mg total) by mouth daily., Disp: 90 tablet, Rfl: 3 .  tiotropium (SPIRIVA HANDIHALER) 18 MCG inhalation capsule, Place 1 capsule (18 mcg total) into inhaler and inhale daily., Disp: 30 capsule, Rfl: 2 .  Vitamin D, Ergocalciferol, (DRISDOL) 50000 units CAPS capsule, Take 50,000 Units by mouth every 7 (seven) days., Disp: , Rfl:    Allergies:  Penicillins  Review of Systems:  Constitutional: Feels well. Cardiovascular: No chest pain.  Pulmonary: Denies hemoptysis The remainder of systems were reviewed and were found to be negative other than what is documented in the HPI.    Physical Examination:   VS: BP 118/62 (BP Location: Left Arm, Cuff Size: Normal)   Pulse 77   Resp 16   Ht 5' 7"  (1.702 m)   Wt 183 lb (83 kg)   SpO2 98%   BMI 28.66 kg/m   General Appearance: No distress  Neuro:without focal findings, mental status, speech normal, alert and oriented HEENT: PERRLA, EOM intact Pulmonary: Bilateral coarse breath sounds CardiovascularNormal S1,S2.  No m/r/g.  Abdomen: Benign, Soft, non-tender, No masses Renal:  No costovertebral tenderness  GU:  No performed at this time. Endoc: No evident thyromegaly, no signs of acromegaly or Cushing features Skin:   warm, no rashes, no ecchymosis  Extremities: normal, no cyanosis, clubbing.      LABORATORY PANEL:   CBC No results for input(s): WBC, HGB, HCT, PLT in the last 168 hours. ------------------------------------------------------------------------------------------------------------------  Chemistries  No results for input(s): NA, K, CL, CO2, GLUCOSE, BUN, CREATININE, CALCIUM, MG, AST, ALT, ALKPHOS, BILITOT in the last 168  hours.  Invalid input(s): GFRCGP ------------------------------------------------------------------------------------------------------------------  Cardiac Enzymes No results for input(s): TROPONINI in the last 168 hours. ------------------------------------------------------------  RADIOLOGY:  No results found.     Thank  you for the consultation and for allowing [facility] to assist in the care of your patient. Our recommendations are noted above.  Please contact us if we can be of further service.   Marda Stalker, M.D., F.C.C.P.  Board Certified in Internal Medicine, Pulmonary Medicine, [place], and Sleep Medicine.  [facility] Office Number: [phone]   05/04/2018

## 2018-05-05 ENCOUNTER — Telehealth: Payer: Self-pay | Admitting: *Deleted

## 2018-05-05 ENCOUNTER — Ambulatory Visit: Payer: BLUE CROSS/BLUE SHIELD | Admitting: Internal Medicine

## 2018-05-05 ENCOUNTER — Encounter: Payer: Self-pay | Admitting: Internal Medicine

## 2018-05-05 VITALS — BP 118/62 | HR 77 | Resp 16 | Ht 67.0 in | Wt 183.0 lb

## 2018-05-05 DIAGNOSIS — J449 Chronic obstructive pulmonary disease, unspecified: Secondary | ICD-10-CM | POA: Diagnosis not present

## 2018-05-05 DIAGNOSIS — Z122 Encounter for screening for malignant neoplasm of respiratory organs: Secondary | ICD-10-CM

## 2018-05-05 MED ORDER — FLUTICASONE FUROATE-VILANTEROL 200-25 MCG/INH IN AEPB: 1.0000 | INHALATION_SPRAY | Freq: Every day | RESPIRATORY_TRACT | 5 refills | Status: DC

## 2018-05-05 MED ORDER — NICOTINE 21 MG/24HR TD PT24: 21.0000 mg | MEDICATED_PATCH | TRANSDERMAL | 1 refills | Status: AC

## 2018-05-05 NOTE — Patient Instructions (Addendum)
Will switch spiriva to Breo 200, take one puff once daily, rinse mouth after use.  Will refer for lung cancer screening.   --Quitting smoking is the most important thing that you can do for your health.  --Quitting smoking will have greater affect on your health than any medicine that we can give you.

## 2018-05-05 NOTE — Telephone Encounter (Signed)
Received a referral for initial lung cancer screening scan.  Contacted the patient and obtained their smoking history, current smoker pkyr 72.5 history   as well as answering questions related to screening process.  Patient denies signs of lung cancer such as weight loss or hemoptysis at this time.  Patient denies comorbidity that would prevent curative treatment if lung cancer were found.  Patient is scheduled for the Shared Decision Making Visit and CT scan on 05-26-18@1415  .

## 2018-05-08 ENCOUNTER — Other Ambulatory Visit: Payer: Self-pay | Admitting: Internal Medicine

## 2018-05-18 ENCOUNTER — Other Ambulatory Visit: Payer: Self-pay | Admitting: Family Medicine

## 2018-05-26 ENCOUNTER — Ambulatory Visit: Admission: RE | Admit: 2018-05-26 | Payer: BLUE CROSS/BLUE SHIELD | Source: Ambulatory Visit

## 2018-05-26 ENCOUNTER — Inpatient Hospital Stay: Payer: BLUE CROSS/BLUE SHIELD | Attending: Nurse Practitioner | Admitting: Nurse Practitioner

## 2018-05-26 ENCOUNTER — Encounter: Payer: Self-pay | Admitting: Nurse Practitioner

## 2018-05-31 ENCOUNTER — Telehealth: Payer: Self-pay | Admitting: *Deleted

## 2018-05-31 NOTE — Telephone Encounter (Signed)
Patient was a NO SHOW For appt for LDCT screening on 05-26-18.  Patient rescheduled for 06-21-18@1400  Tuesday, voiced understanding of where to go for appt.

## 2018-06-12 ENCOUNTER — Other Ambulatory Visit: Payer: Self-pay | Admitting: Cardiovascular Disease

## 2018-06-12 DIAGNOSIS — I2510 Atherosclerotic heart disease of native coronary artery without angina pectoris: Secondary | ICD-10-CM

## 2018-06-12 DIAGNOSIS — I10 Essential (primary) hypertension: Secondary | ICD-10-CM

## 2018-06-21 ENCOUNTER — Ambulatory Visit
Admission: RE | Admit: 2018-06-21 | Discharge: 2018-06-21 | Disposition: A | Discharge status: Home / Self Care | Payer: BLUE CROSS/BLUE SHIELD | Source: Ambulatory Visit | Attending: Nurse Practitioner | Admitting: Nurse Practitioner

## 2018-06-21 ENCOUNTER — Inpatient Hospital Stay: Payer: BLUE CROSS/BLUE SHIELD | Attending: Nurse Practitioner | Admitting: Nurse Practitioner

## 2018-06-21 DIAGNOSIS — Z122 Encounter for screening for malignant neoplasm of respiratory organs: Secondary | ICD-10-CM

## 2018-06-21 DIAGNOSIS — Z87891 Personal history of nicotine dependence: Secondary | ICD-10-CM

## 2018-06-21 DIAGNOSIS — I2510 Atherosclerotic heart disease of native coronary artery without angina pectoris: Secondary | ICD-10-CM | POA: Diagnosis not present

## 2018-06-21 DIAGNOSIS — F17210 Nicotine dependence, cigarettes, uncomplicated: Secondary | ICD-10-CM | POA: Diagnosis not present

## 2018-06-21 DIAGNOSIS — I700 Atherosclerosis of aorta: Secondary | ICD-10-CM | POA: Diagnosis not present

## 2018-06-21 DIAGNOSIS — J432 Centrilobular emphysema: Secondary | ICD-10-CM | POA: Diagnosis not present

## 2018-06-21 NOTE — Progress Notes (Signed)
In accordance with CMS guidelines, patient has met eligibility criteria including age, absence of signs or symptoms of lung cancer.  Social History   Tobacco Use  . Smoking status: Current Every Day Smoker    Packs/day: 1.50    Years: 48.40    Pack years: 72.60    Types: Cigarettes  . Smokeless tobacco: Never Used  Substance Use Topics  . Alcohol use: No  . Drug use: No      A shared decision-making session was conducted prior to the performance of CT scan. This includes one or more decision aids, includes benefits and harms of screening, follow-up diagnostic testing, over-diagnosis, false positive rate, and total radiation exposure.   Counseling on the importance of adherence to annual lung cancer LDCT screening, impact of co-morbidities, and ability or willingness to undergo diagnosis and treatment is imperative for compliance of the program.   Counseling on the importance of continued smoking cessation for former smokers; the importance of smoking cessation for current smokers, and information about tobacco cessation interventions have been given to patient including [place] and 1800 quit [place] programs.   Written order for lung cancer screening with LDCT has been given to the patient and any and all questions have been answered to the best of my abilities.    Yearly follow up will be coordinated by Burgess Estelle, Thoracic Navigator.  Beckey Rutter, DNP, AGNP-C [place] at [facility] [phone] (work cell) [phone] (office) 06/21/18 3:23 PM

## 2018-06-23 ENCOUNTER — Encounter: Payer: Self-pay | Admitting: *Deleted

## 2018-06-26 ENCOUNTER — Encounter: Payer: Self-pay | Admitting: Family Medicine

## 2018-06-26 DIAGNOSIS — I700 Atherosclerosis of aorta: Secondary | ICD-10-CM | POA: Insufficient documentation

## 2018-06-26 DIAGNOSIS — J449 Chronic obstructive pulmonary disease, unspecified: Secondary | ICD-10-CM | POA: Insufficient documentation

## 2018-07-05 ENCOUNTER — Other Ambulatory Visit (HOSPITAL_COMMUNITY)
Admission: RE | Admit: 2018-07-05 | Discharge: 2018-07-05 | Disposition: A | Discharge status: Home / Self Care | Payer: BLUE CROSS/BLUE SHIELD | Source: Ambulatory Visit | Attending: Obstetrics and Gynecology | Admitting: Obstetrics and Gynecology

## 2018-07-05 ENCOUNTER — Encounter: Payer: Self-pay | Admitting: Obstetrics and Gynecology

## 2018-07-05 ENCOUNTER — Ambulatory Visit (INDEPENDENT_AMBULATORY_CARE_PROVIDER_SITE_OTHER): Payer: BLUE CROSS/BLUE SHIELD | Admitting: Obstetrics and Gynecology

## 2018-07-05 VITALS — BP 130/70 | HR 87 | Ht 66.0 in | Wt 183.0 lb

## 2018-07-05 DIAGNOSIS — Z1151 Encounter for screening for human papillomavirus (HPV): Secondary | ICD-10-CM

## 2018-07-05 DIAGNOSIS — M810 Age-related osteoporosis without current pathological fracture: Secondary | ICD-10-CM

## 2018-07-05 DIAGNOSIS — Z23 Encounter for immunization: Secondary | ICD-10-CM

## 2018-07-05 DIAGNOSIS — Z1239 Encounter for other screening for malignant neoplasm of breast: Secondary | ICD-10-CM

## 2018-07-05 DIAGNOSIS — Z01419 Encounter for gynecological examination (general) (routine) without abnormal findings: Secondary | ICD-10-CM

## 2018-07-05 DIAGNOSIS — Z124 Encounter for screening for malignant neoplasm of cervix: Secondary | ICD-10-CM | POA: Insufficient documentation

## 2018-07-05 DIAGNOSIS — Z01411 Encounter for gynecological examination (general) (routine) with abnormal findings: Secondary | ICD-10-CM

## 2018-07-05 DIAGNOSIS — Z8041 Family history of malignant neoplasm of ovary: Secondary | ICD-10-CM

## 2018-07-05 DIAGNOSIS — Z1211 Encounter for screening for malignant neoplasm of colon: Secondary | ICD-10-CM

## 2018-07-05 NOTE — Progress Notes (Signed)
PCP: [name], MD   Chief Complaint  Patient presents with  . Gynecologic Exam    HPI:      Ms. Karen Osborne is a 62 y.o. No obstetric history on file. who LMP was No LMP recorded. Patient has had a hysterectomy., presents today for her annual examination.  Her menses are absent due to menopause. She does not have intermenstrual bleeding. She does not have vasomotor sx.   Sex activity: single partner, contraception - post menopausal status. She does have vaginal dryness.  Last Pap: July 24, 2015  Results were: no abnormalities /neg HPV DNA.  Hx of STDs: none  Last mammogram: July 24, 2015  Results were: normal--routine follow-up in 12 months There is no FH of breast cancer. There is a FH of ovarian cancer in her mat aunt, and prostate cancer and kidney cancer in her dad. She is MyRisk neg 2016. The patient does do self-breast exams.  Colonoscopy: colonoscopy 11 years ago with abnormalities.  Repeat due after 5 years. Pt has never had it done. Doesn't remember GI name.   Tobacco use: The patient currently smokes 1 packs of cigarettes per day for the past many years. Alcohol use: none Exercise: moderately active  DEXA 2016 at [facility] with osteoporosis in spine, severe osteopenia in hip. Pt is long term smoker. Pt has done fosamax in past but PCP stopped it. Due for repeat DEXA.  She does get adequate calcium and Vitamin D in her diet.  Labs with PCP.   Past Medical History:  Diagnosis Date  . BRCA negative 08/2015   MyRisk neg  . COPD (chronic obstructive pulmonary disease) ([place]) 2019  . Coronary artery disease    a. s/p NSTEMI - LHC 01/27/12: prox LAD 20%, mid LAD 99%, prox OM 30%, prox RCA 30%, mid RCA 50%, dist RCA 30%, EF 60%. PCI: 2.25 x 16 mm Promus Element DES to mid LAD //  b. Myoview (7/15): Fixed apical defect (breast attenuation versus scar), no ischemia, EF 64%; low risk   . Depression   . Diabetes mellitus without complication ([place]) 5027  .  Family history of ovarian cancer   . Glucose intolerance (impaired glucose tolerance)    A1C 6.1 01/2012  . History of echocardiogram    Echo (01/28/12): Mild LVH, EF 55%, apical septum and true apex severe HK, Gr 2 DD, mild LAE, normal RVF // Echo 7/18: mild LVH, EF 60-65, no RWMA, Gr 2 DD, normal RVF  . History of nuclear stress test    Nuclear stress test 4/19: EF 74, apicoseptal defect c/w atten artifact, normal perfusion; Low Risk  . Hypercholesteremia   . Hypertension   . NSVD (normal spontaneous vaginal delivery) 1988  . Osteopenia 2016   hip  . Osteoporosis 10/2014   in spine  . Restless leg syndrome   . Tobacco abuse     Past Surgical History:  Procedure Laterality Date  . ABDOMINAL HYSTERECTOMY     TAHBSO  . COLONOSCOPY  10/2006   TA, [facility] (Dr Vira Agar)  . CORONARY ANGIOPLASTY WITH STENT PLACEMENT  01/27/2012   DES to LAD  . gyn surgery  02/1998   total hysterectomy, abn pap smear, no cancer  . LEFT HEART CATHETERIZATION WITH CORONARY ANGIOGRAM N/A 01/27/2012   Procedure: LEFT HEART CATHETERIZATION WITH CORONARY ANGIOGRAM;  Surgeon: Burnell Blanks, MD;  Location: [facility] C[facility] LAB;  Service: Cardiovascular;  Laterality: N/A;  . PERCUTANEOUS CORONARY STENT INTERVENTION (PCI-S)  01/27/2012   Procedure:  PERCUTANEOUS CORONARY STENT INTERVENTION (PCI-S);  Surgeon: Burnell Blanks, MD;  Location: [facility] CATH LAB;  Service: Cardiovascular;;    Family History  Problem Relation Age of Onset  . Prostate cancer Father   . Heart disease Father 19  . Hypertension Father   . Heart attack Father   . Kidney cancer Father   . Diabetes Sister   . Ovarian cancer Maternal Aunt 70  . Breast cancer Neg Hx     Social History   Socioeconomic History  . Marital status: Married    Spouse name: Not on file  . Number of children: 1  . Years of education: Not on file  . Highest education level: Not on file  Occupational History  . 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 Financial resource strain: Not on file  . Food insecurity:    Worry: Not on file    Inability: Not on file  . Transportation needs:    Medical: Not on file    Non-medical: Not on file  Tobacco Use  . Smoking status: Current Every Day Smoker    Packs/day: 1.50    Years: 48.40    Pack years: 72.60    Types: Cigarettes  . Smokeless tobacco: Never Used  Substance and Sexual Activity  . Alcohol use: No  . Drug use: No  . Sexual activity: Yes    Birth control/protection: Surgical    Comment: Hysterectomy  Lifestyle  . Physical activity:    Days per week: Not on file    Minutes per session: Not on file  . Stress: Not on file  Relationships  . Social connections:    Talks on phone: Not on file    Gets together: Not on file    Attends religious service: Not on file    Active member of club or organization: Not on file    Attends meetings of clubs or organizations: Not on file    Relationship status: Not on file  . Intimate partner violence:    Fear of current or ex partner: Not on file    Emotionally abused: Not on file    Physically abused: Not on file    Forced sexual activity: Not on file  Other Topics Concern  . 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 Blood Glucose Monitoring Suppl (ONE TOUCH ULTRA 2) w/Device KIT 1 each by Does not apply route daily. Use as directed to check sugar once a day 1 each 0  . carvedilol (COREG) 6.25 MG tablet TAKE 1 TABLET BY MOUTH TWICE DAILY WITH A MEAL 180 tablet 2  . clopidogrel (PLAVIX) 75 MG tablet TAKE 1 TABLET BY MOUTH EVERY DAY 90 tablet 2  . glucose blood (ONE TOUCH ULTRA TEST) test strip 1 each by Other route daily. Use as instructed to check sugar once a day. 100 each 0  . Ibuprofen-Diphenhydramine HCl (ADVIL PM) 200-25 MG CAPS Take 1 capsule by mouth at bedtime as needed.     Marland Kitchen lisinopril (PRINIVIL,ZESTRIL) 20 MG tablet TAKE 1 TABLET BY MOUTH EVERY DAY. NEED APPT FOR FURTHER REFILLS 90 tablet 3  . metFORMIN (GLUCOPHAGE) 1000 MG tablet Take 1 tablet (1,000 mg total) by mouth 2 (two) times daily with a meal. 180 tablet 1  . nicotine (NICODERM CQ - DOSED IN MG/24  HOURS) 21 mg/24hr patch Place 1 patch (21 mg total) onto the skin daily. 30 patch 1  . ONETOUCH DELICA LANCETS 59X MISC 1 each by Does not apply route daily. Use as directed to check sugar once a day. 100 each 0  . rOPINIRole (REQUIP) 1 MG tablet TAKE 1-2 TABLETS (1-2 MG TOTAL) BY MOUTH AT BEDTIME. 180 tablet 0  . rosuvastatin (CRESTOR) 40 MG tablet Take 1 tablet (40 mg total) by mouth daily. 90 tablet 3  . tiotropium (SPIRIVA HANDIHALER) 18 MCG inhalation capsule Place 1 capsule (18 mcg total) into inhaler and inhale daily. 30 capsule 2  . Vitamin D, Ergocalciferol, (DRISDOL) 50000 units CAPS capsule Take 50,000 Units by mouth every 7 (seven) days.    . hydrochlorothiazide (MICROZIDE) 12.5 MG capsule Take 1 capsule (12.5 mg total) by mouth daily. 90 capsule 3  . nitroGLYCERIN (NITROSTAT) 0.4 MG SL tablet Place 1 tablet (0.4 mg total) under the tongue every 5 (five) minutes as needed for chest pain (up to 3 doses). 25 tablet 3  . Aclidinium Bromide 400 MCG/ACT AEPB Inhale 1 puff into the lungs 2 (two) times daily. 1 each 5  . fluticasone furoate-vilanterol (BREO ELLIPTA) 200-25 MCG/INH AEPB Inhale 1 puff into the lungs daily. Rinse mouth after use. 1 each 5   No facility-administered medications prior to visit.      ROS:  Review of Systems  Constitutional: Positive for fatigue. Negative for fever and unexpected weight change.  Respiratory: Positive for cough and shortness of breath. Negative for wheezing.   Cardiovascular: Negative for chest pain, palpitations and leg swelling.  Gastrointestinal: Negative for blood in stool, constipation, diarrhea, nausea and vomiting.  Endocrine: Negative for cold  intolerance, heat intolerance and polyuria.  Genitourinary: Negative for dyspareunia, dysuria, flank pain, frequency, genital sores, hematuria, menstrual problem, pelvic pain, urgency, vaginal bleeding, vaginal discharge and vaginal pain.  Musculoskeletal: Negative for back pain, joint swelling and myalgias.  Skin: Negative for rash.  Neurological: Negative for dizziness, syncope, light-headedness, numbness and headaches.  Hematological: Negative for adenopathy.  Psychiatric/Behavioral: Positive for dysphoric mood. Negative for agitation, confusion, sleep disturbance and suicidal ideas. The patient is not nervous/anxious.    BREAST: No symptoms    Objective: BP 130/70   Pulse 87   Ht _0  (1.676 m)   Wt 183 lb (83 kg)   BMI 29.54 kg/m    Physical Exam  Constitutional: She is oriented to person, place, and time. She appears well-developed and well-nourished.  Genitourinary: Vagina normal. There is no rash or tenderness on the right labia. There is no rash or tenderness on the left labia. No erythema or tenderness in the vagina. No vaginal discharge found. Right adnexum does not display mass and does not display tenderness. Left adnexum does not display mass and does not display tenderness.  Genitourinary Comments: UTERUS/CX SURG REM  Neck: Normal range of motion. No thyromegaly present.  Cardiovascular: Normal rate, regular rhythm and normal heart sounds.  No murmur heard. Pulmonary/Chest: Effort normal and breath sounds normal. Right breast exhibits no mass, no nipple discharge, no skin change and no tenderness. Left breast exhibits no mass, no nipple discharge, no skin change and no tenderness.  Abdominal: Soft. There is no tenderness. There is no guarding.  Musculoskeletal: Normal range of motion.  Neurological: She is alert and oriented to person, place, and time. No cranial nerve deficit.  Psychiatric: She has a normal mood and affect. Her behavior is normal.  Vitals  reviewed.  Assessment/Plan:  Encounter for annual routine gynecological examination  Cervical cancer screening - Plan: Cytology - PAP  Screening for HPV (human papillomavirus) - Plan: Cytology - PAP  Screening for breast cancer - Pt to sched mammo - Plan: MM 3D SCREEN BREAST BILATERAL  Age-related osteoporosis without current pathological fracture - D[facility] at [facility] due. Will call with results. Cont Vit D/ca. - Plan: DG Bone Density  Screening for colon cancer - Refer to GI for scr colonoscopy. Pt is past due. - Plan: Ambulatory referral to Gastroenterology  Family history of ovarian cancer - Pt is MyRisk neg. No further screening indicated          GYN counsel breast self exam, mammography screening, menopause, adequate intake of calcium and vitamin D, diet and exercise    F/U  Return in about 1 year (around 07/06/2019).  Fatema Rabe B. Rosaleah Person, PA-C 07/05/2018 8:43 AM

## 2018-07-05 NOTE — Addendum Note (Signed)
Addended by: Drenda Freeze on: 07/05/2018 09:06 AM   Modules accepted: Orders

## 2018-07-05 NOTE — Patient Instructions (Addendum)
I value your feedback and entrusting us with your care. If you get a [place] patient survey, I would appreciate you taking the time to let us know about your experience today. Thank you!  [facility]: [phone]  [facility]: [phone]  

## 2018-07-06 ENCOUNTER — Other Ambulatory Visit: Payer: Self-pay

## 2018-07-06 DIAGNOSIS — Z1211 Encounter for screening for malignant neoplasm of colon: Secondary | ICD-10-CM

## 2018-07-06 LAB — CYTOLOGY - PAP
Diagnosis: NEGATIVE
HPV: NOT DETECTED

## 2018-07-12 ENCOUNTER — Encounter: Payer: Self-pay | Admitting: Obstetrics and Gynecology

## 2018-07-12 DIAGNOSIS — Z1231 Encounter for screening mammogram for malignant neoplasm of breast: Secondary | ICD-10-CM | POA: Diagnosis not present

## 2018-07-12 DIAGNOSIS — M85852 Other specified disorders of bone density and structure, left thigh: Secondary | ICD-10-CM | POA: Diagnosis not present

## 2018-07-12 DIAGNOSIS — M810 Age-related osteoporosis without current pathological fracture: Secondary | ICD-10-CM | POA: Diagnosis not present

## 2018-07-12 DIAGNOSIS — Z780 Asymptomatic menopausal state: Secondary | ICD-10-CM | POA: Diagnosis not present

## 2018-07-12 DIAGNOSIS — R928 Other abnormal and inconclusive findings on diagnostic imaging of breast: Secondary | ICD-10-CM | POA: Diagnosis not present

## 2018-07-14 ENCOUNTER — Encounter: Payer: Self-pay | Admitting: Obstetrics and Gynecology

## 2018-07-15 ENCOUNTER — Telehealth: Payer: Self-pay

## 2018-07-15 ENCOUNTER — Other Ambulatory Visit: Payer: Self-pay

## 2018-07-15 NOTE — Telephone Encounter (Signed)
Patient contacted office to reschedule her colonoscopy from 11/15 with Dr. Bonna Gains to 08/19/18 with Dr. Bonna Gains.  Trish in Endoscopy has been informed.  Referral has been updated.  Thanks  [employer]

## 2018-07-18 ENCOUNTER — Telehealth: Payer: Self-pay | Admitting: Obstetrics and Gynecology

## 2018-07-18 DIAGNOSIS — M810 Age-related osteoporosis without current pathological fracture: Secondary | ICD-10-CM

## 2018-07-18 MED ORDER — IBANDRONATE SODIUM 150 MG PO TABS: ORAL_TABLET | ORAL | 3 refills | Status: DC

## 2018-07-18 NOTE — Telephone Encounter (Signed)
Py aware of osteoporosis in spine/hip and FRAX shows 10 yr probability hip fx=7.4% and mj osteoporotic event=17%. Cont ca/Vit D/exercise. Recommend tobacco cessation. Pt willing to restart bisphosphonate. Did in past short-term and PCP took pt off. She states he is now retired and she can take med. Rx boniva. Rechk DEXA in 2 yrs.

## 2018-07-18 NOTE — Telephone Encounter (Signed)
Patient is calling for labs results. Please advise. 

## 2018-08-08 ENCOUNTER — Encounter: Payer: Self-pay | Admitting: Obstetrics and Gynecology

## 2018-08-08 DIAGNOSIS — R928 Other abnormal and inconclusive findings on diagnostic imaging of breast: Secondary | ICD-10-CM | POA: Diagnosis not present

## 2018-08-18 ENCOUNTER — Encounter: Payer: Self-pay | Admitting: *Deleted

## 2018-08-19 ENCOUNTER — Encounter
Admission: RE | Disposition: A | Discharge status: Home / Self Care | Payer: Self-pay | Source: Ambulatory Visit | Attending: Gastroenterology

## 2018-08-19 ENCOUNTER — Ambulatory Visit: Payer: BLUE CROSS/BLUE SHIELD | Admitting: Anesthesiology

## 2018-08-19 ENCOUNTER — Ambulatory Visit
Admission: RE | Admit: 2018-08-19 | Discharge: 2018-08-19 | Disposition: A | Discharge status: Home / Self Care | Payer: BLUE CROSS/BLUE SHIELD | Source: Ambulatory Visit | Attending: Gastroenterology | Admitting: Gastroenterology

## 2018-08-19 DIAGNOSIS — G2581 Restless legs syndrome: Secondary | ICD-10-CM | POA: Insufficient documentation

## 2018-08-19 DIAGNOSIS — K635 Polyp of colon: Secondary | ICD-10-CM

## 2018-08-19 DIAGNOSIS — Z79899 Other long term (current) drug therapy: Secondary | ICD-10-CM | POA: Diagnosis not present

## 2018-08-19 DIAGNOSIS — Z880 Allergy status to penicillin: Secondary | ICD-10-CM | POA: Diagnosis not present

## 2018-08-19 DIAGNOSIS — K6289 Other specified diseases of anus and rectum: Secondary | ICD-10-CM | POA: Diagnosis not present

## 2018-08-19 DIAGNOSIS — J449 Chronic obstructive pulmonary disease, unspecified: Secondary | ICD-10-CM | POA: Insufficient documentation

## 2018-08-19 DIAGNOSIS — M8580 Other specified disorders of bone density and structure, unspecified site: Secondary | ICD-10-CM | POA: Diagnosis not present

## 2018-08-19 DIAGNOSIS — I509 Heart failure, unspecified: Secondary | ICD-10-CM | POA: Insufficient documentation

## 2018-08-19 DIAGNOSIS — E119 Type 2 diabetes mellitus without complications: Secondary | ICD-10-CM | POA: Diagnosis not present

## 2018-08-19 DIAGNOSIS — I252 Old myocardial infarction: Secondary | ICD-10-CM | POA: Diagnosis not present

## 2018-08-19 DIAGNOSIS — K6389 Other specified diseases of intestine: Secondary | ICD-10-CM

## 2018-08-19 DIAGNOSIS — Z1211 Encounter for screening for malignant neoplasm of colon: Secondary | ICD-10-CM | POA: Diagnosis not present

## 2018-08-19 DIAGNOSIS — Z8051 Family history of malignant neoplasm of kidney: Secondary | ICD-10-CM | POA: Insufficient documentation

## 2018-08-19 DIAGNOSIS — F17210 Nicotine dependence, cigarettes, uncomplicated: Secondary | ICD-10-CM | POA: Insufficient documentation

## 2018-08-19 DIAGNOSIS — D125 Benign neoplasm of sigmoid colon: Secondary | ICD-10-CM | POA: Insufficient documentation

## 2018-08-19 DIAGNOSIS — I110 Hypertensive heart disease with heart failure: Secondary | ICD-10-CM | POA: Diagnosis not present

## 2018-08-19 DIAGNOSIS — D124 Benign neoplasm of descending colon: Secondary | ICD-10-CM

## 2018-08-19 DIAGNOSIS — Z8041 Family history of malignant neoplasm of ovary: Secondary | ICD-10-CM | POA: Insufficient documentation

## 2018-08-19 DIAGNOSIS — I2510 Atherosclerotic heart disease of native coronary artery without angina pectoris: Secondary | ICD-10-CM | POA: Diagnosis not present

## 2018-08-19 DIAGNOSIS — D122 Benign neoplasm of ascending colon: Secondary | ICD-10-CM | POA: Diagnosis not present

## 2018-08-19 DIAGNOSIS — Z7984 Long term (current) use of oral hypoglycemic drugs: Secondary | ICD-10-CM | POA: Insufficient documentation

## 2018-08-19 DIAGNOSIS — D126 Benign neoplasm of colon, unspecified: Secondary | ICD-10-CM | POA: Diagnosis not present

## 2018-08-19 DIAGNOSIS — I10 Essential (primary) hypertension: Secondary | ICD-10-CM | POA: Diagnosis not present

## 2018-08-19 DIAGNOSIS — Z8249 Family history of ischemic heart disease and other diseases of the circulatory system: Secondary | ICD-10-CM | POA: Insufficient documentation

## 2018-08-19 DIAGNOSIS — Z833 Family history of diabetes mellitus: Secondary | ICD-10-CM | POA: Insufficient documentation

## 2018-08-19 DIAGNOSIS — Z90710 Acquired absence of both cervix and uterus: Secondary | ICD-10-CM | POA: Diagnosis not present

## 2018-08-19 DIAGNOSIS — Z791 Long term (current) use of non-steroidal anti-inflammatories (NSAID): Secondary | ICD-10-CM | POA: Insufficient documentation

## 2018-08-19 DIAGNOSIS — Z955 Presence of coronary angioplasty implant and graft: Secondary | ICD-10-CM | POA: Diagnosis not present

## 2018-08-19 DIAGNOSIS — E7800 Pure hypercholesterolemia, unspecified: Secondary | ICD-10-CM | POA: Diagnosis not present

## 2018-08-19 DIAGNOSIS — F329 Major depressive disorder, single episode, unspecified: Secondary | ICD-10-CM | POA: Diagnosis not present

## 2018-08-19 HISTORY — PX: COLONOSCOPY WITH PROPOFOL: SHX5780

## 2018-08-19 LAB — GLUCOSE, CAPILLARY: Glucose-Capillary: 130 mg/dL — ABNORMAL HIGH (ref 70–99)

## 2018-08-19 SURGERY — COLONOSCOPY WITH PROPOFOL
Anesthesia: General

## 2018-08-19 MED ORDER — LIDOCAINE HCL (PF) 2 % IJ SOLN
INTRAMUSCULAR | Status: AC
  Filled 2018-08-19: qty 10

## 2018-08-19 MED ORDER — MIDAZOLAM HCL 2 MG/2ML IJ SOLN
INTRAMUSCULAR | Status: AC
  Filled 2018-08-19: qty 2

## 2018-08-19 MED ORDER — PHENYLEPHRINE HCL 10 MG/ML IJ SOLN
INTRAMUSCULAR | Status: DC | PRN
  Administered 2018-08-19: 200 ug via INTRAVENOUS

## 2018-08-19 MED ORDER — MIDAZOLAM HCL 2 MG/2ML IJ SOLN
INTRAMUSCULAR | Status: DC | PRN
  Administered 2018-08-19: 2 mg via INTRAVENOUS

## 2018-08-19 MED ORDER — LIDOCAINE HCL (CARDIAC) PF 100 MG/5ML IV SOSY
PREFILLED_SYRINGE | INTRAVENOUS | Status: DC | PRN
  Administered 2018-08-19: 40 mg via INTRAVENOUS

## 2018-08-19 MED ORDER — SODIUM CHLORIDE 0.9 % IV SOLN
INTRAVENOUS | Status: DC
  Administered 2018-08-19: 1000 mL via INTRAVENOUS

## 2018-08-19 MED ORDER — PROPOFOL 10 MG/ML IV BOLUS
INTRAVENOUS | Status: DC | PRN
  Administered 2018-08-19: 90 mg via INTRAVENOUS

## 2018-08-19 MED ORDER — LIDOCAINE HCL (PF) 1 % IJ SOLN
INTRAMUSCULAR | Status: AC
  Administered 2018-08-19: 0.3 mL
  Filled 2018-08-19: qty 2

## 2018-08-19 MED ORDER — PROPOFOL 500 MG/50ML IV EMUL
INTRAVENOUS | Status: AC
  Filled 2018-08-19: qty 50

## 2018-08-19 MED ORDER — PROPOFOL 500 MG/50ML IV EMUL
INTRAVENOUS | Status: DC | PRN
  Administered 2018-08-19: 150 ug/kg/min via INTRAVENOUS

## 2018-08-19 NOTE — Anesthesia Preprocedure Evaluation (Addendum)
Anesthesia Evaluation  Patient identified by MRN, date of birth, ID band Patient awake    History of Anesthesia Complications Negative for: history of anesthetic complications  Airway Mallampati: II       Dental   Pulmonary neg sleep apnea, COPD,  COPD inhaler, Current Smoker,           Cardiovascular hypertension, Pt. on medications + Past MI and + Cardiac Stents  (-) CHF (-) dysrhythmias (-) Valvular Problems/Murmurs     Neuro/Psych neg Seizures Anxiety Depression    GI/Hepatic Neg liver ROS, neg GERD  ,  Endo/Other  diabetes, Type 2, Oral Hypoglycemic Agents  Renal/GU negative Renal ROS     Musculoskeletal   Abdominal   Peds  Hematology   Anesthesia Other Findings   Reproductive/Obstetrics                            Anesthesia Physical Anesthesia Plan  ASA: III  Anesthesia Plan: General   Post-op Pain Management:    Induction: Intravenous  PONV Risk Score and Plan: 2 and Propofol infusion and TIVA  Airway Management Planned: Nasal Cannula  Additional Equipment:   Intra-op Plan:   Post-operative Plan:   Informed Consent: I have reviewed the patients History and Physical, chart, labs and discussed the procedure including the risks, benefits and alternatives for the proposed anesthesia with the patient or authorized representative who has indicated his/her understanding and acceptance.     Plan Discussed with:   Anesthesia Plan Comments:         Anesthesia Quick Evaluation

## 2018-08-19 NOTE — Transfer of Care (Signed)
Immediate Anesthesia Transfer of Care Note  Patient: Karen Osborne  Procedure(s) Performed: Procedure(s): COLONOSCOPY WITH PROPOFOL (N/A)  Patient Location: PACU and Endoscopy Unit  Anesthesia Type:General  Level of Consciousness: sedated  Airway & Oxygen Therapy: Patient Spontanous Breathing and Patient connected to nasal cannula oxygen  Post-op Assessment: Report given to RN and Post -op Vital signs reviewed and stable  Post vital signs: Reviewed and stable  Last Vitals:  Vitals:   08/19/18 0731 08/19/18 0903  BP: 139/85 117/87  Pulse: 91 83  Resp: 16 12  Temp: (!) 36.2 C (!) 36.2 C  SpO2: 209% 10%    Complications: No apparent anesthesia complications

## 2018-08-19 NOTE — Anesthesia Postprocedure Evaluation (Signed)
Anesthesia Post Note  Patient: Karen Osborne  Procedure(s) Performed: COLONOSCOPY WITH PROPOFOL (N/A )  Patient location during evaluation: Endoscopy Anesthesia Type: General Level of consciousness: awake and alert Pain management: pain level controlled Vital Signs Assessment: post-procedure vital signs reviewed and stable Respiratory status: spontaneous breathing and respiratory function stable Cardiovascular status: stable Anesthetic complications: no     Last Vitals:  Vitals:   08/19/18 0913 08/19/18 0933  BP: 100/60 (!) 121/58  Pulse: 76 72  Resp: 14   Temp:    SpO2: 100% 100%    Last Pain:  Vitals:   08/19/18 0933  TempSrc:   PainSc: 0-No pain                 [place] Artola K

## 2018-08-19 NOTE — H&P (Signed)
Karen Antigua, MD [address] [place], [place], [place], [address] [address], [place], [place], [place], [address] Phone: [phone]  Fax: [phone]  Primary Care Physician:  [name], MD   Pre-Procedure History & Physical: HPI:  Karen Osborne is a 62 y.o. female is here for a colonoscopy.   Past Medical History:  Diagnosis Date  . BRCA negative 08/2015   MyRisk neg  . COPD (chronic obstructive pulmonary disease) ([place]) 2019  . Coronary artery disease    a. s/p NSTEMI - LHC 01/27/12: prox LAD 20%, mid LAD 99%, prox OM 30%, prox RCA 30%, mid RCA 50%, dist RCA 30%, EF 60%. PCI: 2.25 x 16 mm Promus Element DES to mid LAD //  b. Myoview (7/15): Fixed apical defect (breast attenuation versus scar), no ischemia, EF 64%; low risk   . Depression   . Diabetes mellitus without complication ([place]) 6761  . Family history of ovarian cancer   . Glucose intolerance (impaired glucose tolerance)    A1C 6.1 01/2012  . History of echocardiogram    Echo (01/28/12): Mild LVH, EF 55%, apical septum and true apex severe HK, Gr 2 DD, mild LAE, normal RVF // Echo 7/18: mild LVH, EF 60-65, no RWMA, Gr 2 DD, normal RVF  . History of nuclear stress test    Nuclear stress test 4/19: EF 74, apicoseptal defect c/w atten artifact, normal perfusion; Low Risk  . Hypercholesteremia   . Hypertension   . Myocardial infarction ([place])   . NSVD (normal spontaneous vaginal delivery) 1988  . Osteopenia 2016   hip  . Osteoporosis 2019   in spine and femoral neck; at [facility]  . Restless leg syndrome   . Tobacco abuse     Past Surgical History:  Procedure Laterality Date  . ABDOMINAL HYSTERECTOMY     TA[facility]BSO  . COLONOSCOPY  10/2006   TA, [place] (Dr Vira Agar)  . CORONARY ANGIOPLASTY WITH STENT PLACEMENT  01/27/2012   DES to LAD  . gyn surgery  02/1998   total hysterectomy, abn pap smear, no cancer  . LEFT HEART CATHETERIZATION WITH CORONARY ANGIOGRAM N/A 01/27/2012   Procedure: LEFT HEART  CATHETERIZATION WITH CORONARY ANGIOGRAM;  Surgeon: Burnell Blanks, MD;  Location: [facility] CATH LAB;  Service: Cardiovascular;  Laterality: N/A;  . PERCUTANEOUS CORONARY STENT INTERVENTION (PCI-S)  01/27/2012   Procedure: PERCUTANEOUS CORONARY STENT INTERVENTION (PCI-S);  Surgeon: Burnell Blanks, MD;  Location: [facility] CATH LAB;  Service: Cardiovascular;;    Prior to Admission medications   Medication Sig Start Date End Date Taking? Authorizing Provider  Blood Glucose Monitoring Suppl (ONE TOUCH ULTRA 2) w/Device KIT 1 each by Does not apply route daily. Use as directed to check sugar once a day 01/12/18  Yes [name], MD  carvedilol (COREG) 6.25 MG tablet TAKE 1 TABLET BY MOUTH TWICE DAILY WITH A MEAL 06/14/18  Yes Sherren Mocha, MD  glucose blood (ONE TOUCH ULTRA TEST) test strip 1 each by Other route daily. Use as instructed to check sugar once a day. 01/12/18  Yes [name], MD  ibandronate (BONIVA) 150 MG tablet Take 1 tab monthly, on an empty stomach in the AM with 8 oz tap water, and nothing else by mouth or lie down for the next 30 min. 95/0/93  Yes Copland, Alicia B, PA-C  lisinopril (PRINIVIL,ZESTRIL) 20 MG tablet TAKE 1 TABLET BY MOUTH EVERY DAY. NEED APPT FOR FURTHER REFILLS 03/11/18  Yes Richardson Dopp T, PA-C  metFORMIN (GLUCOPHAGE) 1000 MG  tablet Take 1 tablet (1,000 mg total) by mouth 2 (two) times daily with a meal. 02/02/18  Yes [name], MD  rOPINIRole (REQUIP) 1 MG tablet TAKE 1-2 TABLETS (1-2 MG TOTAL) BY MOUTH AT BEDTIME. 05/18/18  Yes [name], MD  rosuvastatin (CRESTOR) 40 MG tablet Take 1 tablet (40 mg total) by mouth daily. 03/07/18  Yes Weaver, Scott T, PA-C  tiotropium (SPIRIVA HANDIHALER) 18 MCG inhalation capsule Place 1 capsule (18 mcg total) into inhaler and inhale daily. 04/07/18 04/07/19 Yes Laverle Hobby, MD  Vitamin D, Ergocalciferol, (DRISDOL) 50000 units CAPS capsule Take 50,000 Units by mouth every 7 (seven) days.   Yes  [provider]  clopidogrel (PLAVIX) 75 MG tablet TAKE 1 TABLET BY MOUTH EVERY DAY 03/31/18   Sherren Mocha, MD  hydrochlorothiazide (MICROZIDE) 12.5 MG capsule Take 1 capsule (12.5 mg total) by mouth daily. 01/21/18 04/21/18  Richardson Dopp T, PA-C  Ibuprofen-Diphenhydramine HCl (ADVIL PM) 200-25 MG CAPS Take 1 capsule by mouth at bedtime as needed. 05/28/16   [name], MD  nicotine (NICODERM CQ - DOSED IN MG/24 HOURS) 21 mg/24hr patch Place 1 patch (21 mg total) onto the skin daily. 05/05/18 05/05/19  Laverle Hobby, MD  nitroGLYCERIN (NITROSTAT) 0.4 MG SL tablet Place 1 tablet (0.4 mg total) under the tongue every 5 (five) minutes as needed for chest pain (up to 3 doses). 05/29/15 12/18/17  Richardson Dopp T, PA-C  ONETOUCH DELICA LANCETS 87F MISC 1 each by Does not apply route daily. Use as directed to check sugar once a day. 01/12/18   [name], MD    Allergies as of 07/07/2018 - Review Complete 07/05/2018  Allergen Reaction Noted  . Penicillins Rash 05/28/2016    Family History  Problem Relation Age of Onset  . Prostate cancer Father   . Heart disease Father 29  . Hypertension Father   . Heart attack Father   . Kidney cancer Father   . Diabetes Sister   . Ovarian cancer Maternal Aunt 70  . Breast cancer Neg Hx     Social History   Socioeconomic History  . Marital status: Married    Spouse name: Not on file  . Number of children: 1  . Years of education: Not on file  . Highest education level: Not on file  Occupational History  . 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 Financial resource strain: Not on file  . Food insecurity:    Worry: Not on file    Inability: Not on file  . Transportation needs:    Medical: Not on file    Non-medical: Not on file  Tobacco Use  . Smoking status: Current Every Day Smoker    Packs/day: 1.50    Years: 48.40    Pack years: 72.60    Types: Cigarettes  .  Smokeless tobacco: Never Used  Substance and Sexual Activity  . Alcohol use: No  . Drug use: No  . Sexual activity: Yes    Birth control/protection: Surgical    Comment: Hysterectomy  Lifestyle  . Physical activity:    Days per week: Not on file    Minutes per session: Not on file  . Stress: Not on file  Relationships  . Social connections:    Talks on phone: Not on file    Gets together: Not on file    Attends religious service: Not on file    Active member of club or organization:  Not on file    Attends meetings of clubs or organizations: Not on file    Relationship status: Not on file  . Intimate partner violence:    Fear of current or ex partner: Not on file    Emotionally abused: Not on file    Physically abused: Not on file    Forced sexual activity: Not on file  Other Topics Concern  . 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 Neck:  Supple; no masses or thyromegaly. Lungs:  Clear throughout to auscultation, normal respiratory effort.    Heart:  +S1, +S2, Regular rate and rhythm, No edema. Abdomen:  Soft, nontender and nondistended. Normal bowel sounds, without guarding, and without rebound.   Neurologic:  Alert and  oriented x4;  grossly normal neurologically.  Impression/Plan: Karen Osborne is here for a colonoscopy to be performed for average risk screening.  Risks, benefits, limitations, and alternatives regarding  colonoscopy have been reviewed with the patient.  Questions have been answered.  All parties agreeable.   Virgel Manifold, MD  08/19/2018, 8:09 AM

## 2018-08-19 NOTE — Anesthesia Procedure Notes (Signed)
Date/Time: 08/19/2018 8:12 AM Performed by: Doreen Salvage, CRNA Pre-anesthesia Checklist: Patient identified, Emergency Drugs available, Suction available and Patient being monitored Patient Re-evaluated:Patient Re-evaluated prior to induction Oxygen Delivery Method: Nasal cannula Induction Type: IV induction Dental Injury: Teeth and Oropharynx as per pre-operative assessment  Comments: Nasal cannula with etCO2 monitoring

## 2018-08-19 NOTE — Op Note (Signed)
[facility]astroenterology Patient Name: Karen Osborne Procedure Date: 08/19/2018 7:56 AM MRN: [id number] Account #: [id number] Date of Birth: [birth year] Admit Type: Outpatient Age: 62 Room: [facility] ENDO ROOM 2 Gender: Female Note Status: Finalized Procedure:            Colonoscopy Indications:          Screening for colorectal malignant neoplasm Providers:            Rickey Farrier B. Bonna Gains MD, MD Referring MD:         [name] (Referring MD) Medicines:            Monitored Anesthesia Care Complications:        No immediate complications. Procedure:            Pre-Anesthesia Assessment:                       - ASA Grade Assessment: II - A patient with mild                        systemic disease.                       - Prior to the procedure, a History and Physical was                        performed, and patient medications, allergies and                        sensitivities were reviewed. The patient's tolerance of                        previous anesthesia was reviewed.                       - The risks and benefits of the procedure and the                        sedation options and risks were discussed with the                        patient. All questions were answered and informed                        consent was obtained.                       - Patient identification and proposed procedure were                        verified prior to the procedure by the physician, the                        nurse, the anesthesiologist, the anesthetist and the                        technician. The procedure was verified in the procedure                        room.                       After  obtaining informed consent, the colonoscope was                        passed under direct vision. Throughout the procedure,                        the patient's blood pressure, pulse, and oxygen                        saturations were monitored continuously. The                Colonoscope was introduced through the anus and                        advanced to the the cecum, identified by appendiceal                        orifice and ileocecal valve. The colonoscopy was                        performed with ease. The patient tolerated the                        procedure well. The quality of the bowel preparation                        was good. Findings:      The perianal and digital rectal examinations were normal.      A localized area of mildly thickened folds of the mucosa was found at       the ileocecal valve. Biopsies were taken with a cold forceps for       histology.      A 8 mm polyp was found in the ascending colon. The polyp was flat. The       polyp was removed with a cold snare. Resection and retrieval were       complete. To prevent bleeding after the polypectomy, one hemostatic clip       was successfully placed. There was no bleeding at the end of the       procedure.      Four sessile polyps were found in the sigmoid colon and descending       colon. The polyps were 3 to 4 mm in size. These polyps were removed with       a cold biopsy forceps. Resection and retrieval were complete.      A 5 mm polyp was found in the sigmoid colon. The polyp was sessile. The       polyp was removed with a cold snare. Resection and retrieval were       complete.      The exam was otherwise without abnormality.      The rectum, sigmoid colon, descending colon, transverse colon, ascending       colon and cecum appeared normal.      Anal papilla(e) were hypertrophied.      No additional abnormalities were found on retroflexion. Impression:           - Thickened folds of the mucosa at the ileocecal valve.                        Biopsied.                       -  One 8 mm polyp in the ascending colon, removed with a                        cold snare. Resected and retrieved. Clip was placed.                       - Four 3 to 4 mm polyps in the sigmoid  colon and in the                        descending colon, removed with a cold biopsy forceps.                        Resected and retrieved.                       - One 5 mm polyp in the sigmoid colon, removed with a                        cold snare. Resected and retrieved.                       - The examination was otherwise normal.                       - The rectum, sigmoid colon, descending colon,                        transverse colon, ascending colon and cecum are normal.                       - Anal papilla(e) were hypertrophied. Recommendation:       - Discharge patient to home (with escort).                       - Advance diet as tolerated.                       - Continue present medications.                       - Await pathology results.                       - Repeat colonoscopy in 3 years.                       - The findings and recommendations were discussed with                        the patient.                       - The findings and recommendations were discussed with                        the patient's family.                       - Return to primary care physician as previously                        scheduled. Procedure  Code(s):    --- Professional ---                       [phone], Colonoscopy, flexible; with removal of tumor(s),                        polyp(s), or other lesion(s) by snare technique                       45380, 59, Colonoscopy, flexible; with biopsy, single                        or multiple Diagnosis Code(s):    --- Professional ---                       Z12.11, Encounter for screening for malignant neoplasm                        of colon                       K63.89, Other specified diseases of intestine                       D12.2, Benign neoplasm of ascending colon                       D12.5, Benign neoplasm of sigmoid colon                       D12.4, Benign neoplasm of descending colon                       K62.89, Other  specified diseases of anus and rectum CPT copyright 2018 American Medical Association. All rights reserved. The codes documented in this report are preliminary and upon coder review may  be revised to meet current compliance requirements.  Vonda Antigua, MD Margretta Sidle B. Bonna Gains MD, MD 08/19/2018 9:06:09 AM This report has been signed electronically. Number of Addenda: 0 Note Initiated On: 08/19/2018 7:56 AM Scope Withdrawal Time: 0 hours 28 minutes 25 seconds  Total Procedure Duration: 0 hours 41 minutes 14 seconds  Estimated Blood Loss: Estimated blood loss: none.      [facility]

## 2018-08-19 NOTE — Anesthesia Post-op Follow-up Note (Signed)
Anesthesia QCDR form completed.        

## 2018-08-22 ENCOUNTER — Encounter: Payer: Self-pay | Admitting: Gastroenterology

## 2018-08-22 LAB — SURGICAL PATHOLOGY

## 2018-08-23 ENCOUNTER — Encounter: Payer: Self-pay | Admitting: Family Medicine

## 2018-08-24 ENCOUNTER — Encounter: Payer: Self-pay | Admitting: Gastroenterology

## 2018-09-09 ENCOUNTER — Other Ambulatory Visit: Payer: Self-pay | Admitting: Family Medicine

## 2018-09-09 NOTE — Telephone Encounter (Signed)
Will you plz schedule pt for annual CPE?

## 2018-09-09 NOTE — Telephone Encounter (Signed)
I spoke with pt and advised her. She said she will check her schedule when she gets back to work and call back to schedule.

## 2018-10-30 ENCOUNTER — Encounter: Payer: Self-pay | Admitting: Family Medicine

## 2018-11-09 ENCOUNTER — Ambulatory Visit: Payer: BLUE CROSS/BLUE SHIELD | Admitting: Cardiovascular Disease

## 2018-11-15 ENCOUNTER — Encounter: Payer: Self-pay | Admitting: Physician Assistant

## 2018-11-15 ENCOUNTER — Ambulatory Visit: Payer: BLUE CROSS/BLUE SHIELD | Admitting: Physician Assistant

## 2018-11-15 VITALS — BP 128/72 | HR 82 | Ht 66.0 in | Wt 183.8 lb

## 2018-11-15 DIAGNOSIS — I2510 Atherosclerotic heart disease of native coronary artery without angina pectoris: Secondary | ICD-10-CM

## 2018-11-15 DIAGNOSIS — F17200 Nicotine dependence, unspecified, uncomplicated: Secondary | ICD-10-CM

## 2018-11-15 DIAGNOSIS — E1169 Type 2 diabetes mellitus with other specified complication: Secondary | ICD-10-CM

## 2018-11-15 DIAGNOSIS — I10 Essential (primary) hypertension: Secondary | ICD-10-CM | POA: Diagnosis not present

## 2018-11-15 DIAGNOSIS — E7800 Pure hypercholesterolemia, unspecified: Secondary | ICD-10-CM | POA: Diagnosis not present

## 2018-11-15 NOTE — Patient Instructions (Addendum)
Medication Instructions:  Your physician recommends that you continue on your current medications as directed. Please refer to the Current Medication list given to you today.  If you need a refill on your cardiac medications before your next appointment, please call your pharmacy.   Lab work: None ordered  If you have labs (blood work) drawn today and your tests are completely normal, you will receive your results only by: . MyChart Message (if you have MyChart) OR . A paper copy in the mail If you have any lab test that is abnormal or we need to change your treatment, we will call you to review the results.  Testing/Procedures: None ordered  Follow-Up: At [facility], you and your health needs are our priority.  As part of our continuing mission to provide you with exceptional heart care, we have created designated Provider Care Teams.  These Care Teams include your primary Cardiologist (physician) and Advanced Practice Providers (APPs -  Physician Assistants and Nurse Practitioners) who all work together to provide you with the care you need, when you need it. You will need a follow up appointment in:  6 months.  Please call our office 2 months in advance to schedule this appointment.  You may see Michael Cooper, MD or one of the following Advanced Practice Providers on your designated Care Team: Scott Weaver, PA-C Vin Bhagat, PA-C . Janine Hammond, NP  Any Other Special Instructions Will Be Listed Below (If Applicable).    

## 2018-11-15 NOTE — Progress Notes (Signed)
Cardiology Office Note    Date:  11/15/2018   ID:  Karen Osborne, DOB 31-Dec-1955, MRN [id number]  PCP:  [name], MD  Cardiologist:  Dr. Burt Knack   Chief Complaint: 10  Months follow up  History of Present Illness:   Karen Osborne is a 63 y.o. female  with CAD s/p NSTEMI 01/2012 tx with DES to mid LAD, HTN, HL, COPD, tobacco abuse and DM presents for follow up.  Echocardiogram in July 2018 demonstrated normal LV function with moderate diastolic dysfunction.  Stress test 12/2017: EF 74, tiny apicoseptal fixed defect unchanged from 2015 (likely attenuation artifact), normal perfusion, low risk.  Complained of dyspnea when last seen by APP 01/2018. Felt due to COPD given reassuring stress test and echo as above.   Here today for follow up.  Recent CT of the chest for lung cancer screening showed COPD/emphysema, aortic atherosclerosis and three-vessel coronary artery disease.  Patient denies any exertional chest pain however has dyspnea.  Patient most likely due to pulmonary disease.  This is stable for many years.  Patient denies chest pain, palpitation, orthopnea, PND, syncope, lower extremity edema, melena or blood in his stool or urine.  She continues to smoke, she has cut back, now smoking half a pack a day from 2 packs.   Past Medical History:  Diagnosis Date  . BRCA negative 08/2015   MyRisk neg  . COPD (chronic obstructive pulmonary disease) ([place]) 2019  . Coronary artery disease    a. s/p NSTEMI - [facility] 01/27/12: prox LAD 20%, mid LAD 99%, prox OM 30%, prox RCA 30%, mid RCA 50%, dist RCA 30%, EF 60%. PCI: 2.25 x 16 mm Promus Element DES to mid LAD //  b. Myoview (7/15): Fixed apical defect (breast attenuation versus scar), no ischemia, EF 64%; low risk   . Depression   . Diabetes mellitus without complication ([place]) 7482  . Family history of ovarian cancer   . Glucose intolerance (impaired glucose tolerance)    A1C 6.1 01/2012  . History of echocardiogram    Echo  (01/28/12): Mild LVH, EF 55%, apical septum and true apex severe HK, Gr 2 DD, mild LAE, normal RVF // Echo 7/18: mild LVH, EF 60-65, no RWMA, Gr 2 DD, normal RVF  . History of nuclear stress test    Nuclear stress test 4/19: EF 74, apicoseptal defect c/w atten artifact, normal perfusion; Low Risk  . Hypercholesteremia   . Hypertension   . Myocardial infarction ([place])   . NSVD (normal spontaneous vaginal delivery) 1988  . Osteopenia 2016   hip  . Osteoporosis 2019   in spine and femoral neck; at [facility]  . Restless leg syndrome   . Tobacco abuse     Past Surgical History:  Procedure Laterality Date  . ABDOMINAL HYSTERECTOMY     TA[facility]BS[facility]  . COLONOSCOPY  10/2006   TA, [facility] (Dr Vira Agar)  . COLONOSCOPY WITH PROPOFOL N/A 08/19/2018   TAs, rpt 3 yrs (Tahiliani, Lennette Bihari, MD)  . CORONARY ANGIOPLASTY WITH STENT PLACEMENT  01/27/2012   DES to LAD  . gyn surgery  02/1998   total hysterectomy, abn pap smear, no cancer  . LEFT HEART CATHETERIZATION WITH CORONARY ANGIOGRAM N/A 01/27/2012   Procedure: LEFT HEART CATHETERIZATION WITH CORONARY ANGIOGRAM;  Surgeon: Burnell Blanks, MD;  Location: [facility] CATH LAB;  Service: Cardiovascular;  Laterality: N/A;  . PERCUTANEOUS CORONARY STENT INTERVENTION (PCI-S)  01/27/2012   Procedure: PERCUTANEOUS CORONARY STENT INTERVENTION (PCI-S);  Surgeon: Harrell Gave  Santina Evans, MD;  Location: [facility] CATH LAB;  Service: Cardiovascular;;    Current Medications: Prior to Admission medications   Medication Sig Start Date End Date Taking? Authorizing Provider  Blood Glucose Monitoring Suppl (ONE TOUCH ULTRA 2) w/Device KIT 1 each by Does not apply route daily. Use as directed to check sugar once a day 01/12/18   [name], MD  carvedilol (COREG) 6.25 MG tablet TAKE 1 TABLET BY MOUTH TWICE DAILY WITH A MEAL 06/14/18   Sherren Mocha, MD  clopidogrel (PLAVIX) 75 MG tablet TAKE 1 TABLET BY MOUTH EVERY DAY 03/31/18   Sherren Mocha, MD  glucose blood (ONE TOUCH ULTRA TEST)  test strip 1 each by Other route daily. Use as instructed to check sugar once a day. 01/12/18   [name], MD  hydrochlorothiazide (MICROZIDE) 12.5 MG capsule Take 1 capsule (12.5 mg total) by mouth daily. 01/21/18 04/21/18  Richardson Dopp T, PA-C  ibandronate (BONIVA) 150 MG tablet Take 1 tab monthly, on an empty stomach in the AM with 8 oz tap water, and nothing else by mouth or lie down for the next 30 min. 79/0/24   Copland, Deirdre Evener, PA-C  Ibuprofen-Diphenhydramine HCl (ADVIL PM) 200-25 MG CAPS Take 1 capsule by mouth at bedtime as needed. 05/28/16   [name], MD  lisinopril (PRINIVIL,ZESTRIL) 20 MG tablet TAKE 1 TABLET BY MOUTH EVERY DAY. NEED APPT FOR FURTHER REFILLS 03/11/18   Richardson Dopp T, PA-C  metFORMIN (GLUCOPHAGE) 1000 MG tablet TAKE 1 TABLET (1,000 MG TOTAL) BY MOUTH 2 (TWO) TIMES DAILY WITH A MEAL. 09/09/18   [name], MD  nicotine (NICODERM CQ - DOSED IN MG/24 HOURS) 21 mg/24hr patch Place 1 patch (21 mg total) onto the skin daily. 05/05/18 05/05/19  Laverle Hobby, MD  nitroGLYCERIN (NITROSTAT) 0.4 MG SL tablet Place 1 tablet (0.4 mg total) under the tongue every 5 (five) minutes as needed for chest pain (up to 3 doses). 05/29/15 12/18/17  Richardson Dopp T, PA-C  ONETOUCH DELICA LANCETS 09B MISC 1 each by Does not apply route daily. Use as directed to check sugar once a day. 01/12/18   [name], MD  rOPINIRole (REQUIP) 1 MG tablet TAKE 1-2 TABLETS (1-2 MG TOTAL) BY MOUTH AT BEDTIME. 05/18/18   [name], MD  rosuvastatin (CRESTOR) 40 MG tablet Take 1 tablet (40 mg total) by mouth daily. 03/07/18   Richardson Dopp T, PA-C  tiotropium (SPIRIVA HANDIHALER) 18 MCG inhalation capsule Place 1 capsule (18 mcg total) into inhaler and inhale daily. 04/07/18 04/07/19  Laverle Hobby, MD  Vitamin D, Ergocalciferol, (DRISDOL) 50000 units CAPS capsule Take 50,000 Units by mouth every 7 (seven) days.    [provider]    Allergies:   Penicillins    Social History   Socioeconomic History  . Marital status: Married    Spouse name: Not on file  . Number of children: 1  . Years of education: Not on file  . Highest education level: Not on file  Occupational History  . 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 Financial resource strain: Not on file  . Food insecurity:    Worry: Not on file    Inability: Not on file  . Transportation needs:    Medical: Not on file    Non-medical: Not on file  Tobacco Use  . Smoking status: Current Every Day Smoker    Packs/day: 1.50    Years: 48.40  Pack years: 72.60    Types: Cigarettes  . Smokeless tobacco: Never Used  Substance and Sexual Activity  . Alcohol use: No  . Drug use: No  . Sexual activity: Yes    Birth control/protection: Surgical    Comment: Hysterectomy  Lifestyle  . Physical activity:    Days per week: Not on file    Minutes per session: Not on file  . Stress: Not on file  Relationships  . Social connections:    Talks on phone: Not on file    Gets together: Not on file    Attends religious service: Not on file    Active member of club or organization: Not on file    Attends meetings of clubs or organizations: Not on file    Relationship status: Not on file  Other Topics Concern  . 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  ROS:   Please see the history of present illness.    ROS All other systems reviewed and are negative.   PHYSICAL EXAM:   VS:  BP 128/72   Pulse 82   Ht 5' 6"  (1.676 m)   Wt 183 lb 12.8 oz (83.4 kg)   SpO2 97%   BMI 29.67 kg/m    GEN: Well  nourished, well developed, in no acute distress  HEENT: normal  Neck: no JVD, carotid bruits, or masses Cardiac:RRR; no murmurs, rubs, or gallops,no edema  Respiratory:  clear to auscultation bilaterally, normal work of breathing GI: soft, nontender, nondistended, + BS MS: no deformity or atrophy  Skin: warm and dry, no rash Neuro:  Alert and Oriented x 3, Strength and sensation are intact Psych: euthymic mood, full affect  Wt Readings from Last 3 Encounters:  11/15/18 183 lb 12.8 oz (83.4 kg)  08/19/18 179 lb (81.2 kg)  07/05/18 183 lb (83 kg)      Studies/Labs Reviewed:   EKG:  EKG is ordered today.  The ekg ordered today demonstrates NSR at rate of 73 bpm  Recent Labs: 01/07/2018: Hemoglobin 14.6; NT-Pro BNP 20; Platelets 271 02/04/2018: ALT 16; BUN 14; Creatinine, Ser 1.07; Potassium 4.1; Sodium 137   Lipid Panel    Component Value Date/Time   CHOL 123 06/25/2017 0734   TRIG 153 (H) 06/25/2017 0734   HDL 28 (L) 06/25/2017 0734   CHOLHDL 4.4 06/25/2017 0734   CHOLHDL 4 02/18/2015 0746   VLDL 22.4 02/18/2015 0746   LDLCALC 64 06/25/2017 0734    Additional studies/ records that were reviewed today include:   As above  ASSESSMENT & PLAN:    1. CAD -No angina.  Her dyspnea of exertion most likely due to pulmonary disease.  Stress test year ago was low risk.  Continue Plavix, Coreg and statin.  She is due to check her lipids, stable follow-up with PCP.  2. HTN -Blood pressure stable on current medication.  3. Tobacco abuse -Trying to quit.  Advised complete cessation.  4. DM -Managed by PCP.  Blood sugar running 120s at home.  5. COPD - On Spiriva.    Medication Adjustments/Labs and Tests Ordered: Current medicines are reviewed at length  with the patient today.  Concerns regarding medicines are outlined above.  Medication changes, Labs and Tests ordered today are listed in the Patient Instructions below. Patient Instructions  Medication Instructions:  Your  physician recommends that you continue on your current medications as directed. Please refer to the Current Medication list given to you today.  If you need a refill on your cardiac medications before your next appointment, please call your pharmacy.   Lab work: None ordered  If you have labs (blood work) drawn today and your tests are completely normal, you will receive your results only by: Marland Kitchen MyChart Message (if you have MyChart) OR . A paper copy in the mail If you have any lab test that is abnormal or we need to change your treatment, we will call you to review the results.  Testing/Procedures: None ordered  Follow-Up: At [facility], you and your health needs are our priority.  As part of our continuing mission to provide you with exceptional heart care, we have created designated Provider Care Teams.  These Care Teams include your primary Cardiologist (physician) and Advanced Practice Providers (APPs -  Physician Assistants and Nurse Practitioners) who all work together to provide you with the care you need, when you need it. .   Any Other Special Instructions Will Be Listed Below (If Applicable).       Jarrett Soho, [place]  11/15/2018 3:30 PM    [facility] [place], [place], [place]  [address] Phone: [phone]; Fax: [phone]

## 2018-11-28 ENCOUNTER — Ambulatory Visit: Payer: BLUE CROSS/BLUE SHIELD | Admitting: Family Medicine

## 2018-11-28 ENCOUNTER — Telehealth: Payer: Self-pay

## 2018-11-28 NOTE — Telephone Encounter (Signed)
Spoke with pt to find out how she was doing since she missed her 8:15 OV this morning.  States she thought it was tomorrow and was very Tax adviser.  Pt r/s OV for 12/02/18 at 9:15 AM.  Fyi to Dr. Darnell Level.

## 2018-11-28 NOTE — Telephone Encounter (Signed)
Noted thanks °

## 2018-11-30 ENCOUNTER — Other Ambulatory Visit: Payer: Self-pay | Admitting: Family Medicine

## 2018-12-02 ENCOUNTER — Ambulatory Visit: Payer: BLUE CROSS/BLUE SHIELD | Admitting: Family Medicine

## 2018-12-16 ENCOUNTER — Other Ambulatory Visit: Payer: Self-pay | Admitting: Family Medicine

## 2018-12-30 ENCOUNTER — Other Ambulatory Visit (INDEPENDENT_AMBULATORY_CARE_PROVIDER_SITE_OTHER): Payer: BLUE CROSS/BLUE SHIELD

## 2018-12-30 ENCOUNTER — Telehealth: Payer: Self-pay | Admitting: Radiology

## 2018-12-30 ENCOUNTER — Other Ambulatory Visit: Payer: Self-pay | Admitting: Family Medicine

## 2018-12-30 DIAGNOSIS — Z1159 Encounter for screening for other viral diseases: Secondary | ICD-10-CM | POA: Diagnosis not present

## 2018-12-30 DIAGNOSIS — E538 Deficiency of other specified B group vitamins: Secondary | ICD-10-CM

## 2018-12-30 DIAGNOSIS — E7800 Pure hypercholesterolemia, unspecified: Secondary | ICD-10-CM

## 2018-12-30 DIAGNOSIS — E1169 Type 2 diabetes mellitus with other specified complication: Secondary | ICD-10-CM

## 2018-12-30 DIAGNOSIS — M810 Age-related osteoporosis without current pathological fracture: Secondary | ICD-10-CM | POA: Diagnosis not present

## 2018-12-30 LAB — COMPREHENSIVE METABOLIC PANEL
ALT: 16 U/L (ref 0–35)
AST: 15 U/L (ref 0–37)
Albumin: 4.2 g/dL (ref 3.5–5.2)
Alkaline Phosphatase: 78 U/L (ref 39–117)
BUN: 15 mg/dL (ref 6–23)
CO2: 27 mEq/L (ref 19–32)
Calcium: 9.7 mg/dL (ref 8.4–10.5)
Chloride: 100 mEq/L (ref 96–112)
Creatinine, Ser: 1.08 mg/dL (ref 0.40–1.20)
GFR: 51.39 mL/min — ABNORMAL LOW (ref >60.00)
Glucose, Bld: 129 mg/dL — ABNORMAL HIGH (ref 70–99)
Potassium: 4.4 mEq/L (ref 3.5–5.1)
Sodium: 137 mEq/L (ref 135–145)
Total Bilirubin: 0.4 mg/dL (ref 0.2–1.2)
Total Protein: 7 g/dL (ref 6.0–8.3)

## 2018-12-30 LAB — VITAMIN D 25 HYDROXY (VIT D DEFICIENCY, FRACTURES): VITD: 113.52 ng/mL (ref 30.00–100.00)

## 2018-12-30 LAB — VITAMIN B12: Vitamin B-12: 195 pg/mL — ABNORMAL LOW (ref 211–911)

## 2018-12-30 LAB — LIPID PANEL
Cholesterol: 104 mg/dL (ref 0–200)
HDL: 28.3 mg/dL — ABNORMAL LOW (ref >39.00)
LDL Cholesterol: 39 mg/dL (ref 0–99)
NonHDL: 75.28
Total CHOL/HDL Ratio: 4
Triglycerides: 182 mg/dL — ABNORMAL HIGH (ref 0.0–149.0)
VLDL: 36.4 mg/dL (ref 0.0–40.0)

## 2018-12-30 LAB — HEMOGLOBIN A1C: Hgb A1c MFr Bld: 6.9 % — ABNORMAL HIGH (ref 4.6–6.5)

## 2018-12-30 NOTE — Telephone Encounter (Signed)
Noted  

## 2018-12-30 NOTE — Telephone Encounter (Signed)
[facility] called a critical VIT D level - 113.52, results given to Dr Danise Mina

## 2019-01-02 ENCOUNTER — Encounter: Payer: Self-pay | Admitting: Family Medicine

## 2019-01-02 ENCOUNTER — Ambulatory Visit (INDEPENDENT_AMBULATORY_CARE_PROVIDER_SITE_OTHER): Payer: BLUE CROSS/BLUE SHIELD | Admitting: Family Medicine

## 2019-01-02 VITALS — Ht 67.0 in

## 2019-01-02 DIAGNOSIS — E7800 Pure hypercholesterolemia, unspecified: Secondary | ICD-10-CM | POA: Diagnosis not present

## 2019-01-02 DIAGNOSIS — F17200 Nicotine dependence, unspecified, uncomplicated: Secondary | ICD-10-CM | POA: Diagnosis not present

## 2019-01-02 DIAGNOSIS — E538 Deficiency of other specified B group vitamins: Secondary | ICD-10-CM

## 2019-01-02 DIAGNOSIS — M810 Age-related osteoporosis without current pathological fracture: Secondary | ICD-10-CM

## 2019-01-02 DIAGNOSIS — I700 Atherosclerosis of aorta: Secondary | ICD-10-CM

## 2019-01-02 DIAGNOSIS — E1169 Type 2 diabetes mellitus with other specified complication: Secondary | ICD-10-CM | POA: Diagnosis not present

## 2019-01-02 DIAGNOSIS — R748 Abnormal levels of other serum enzymes: Secondary | ICD-10-CM

## 2019-01-02 LAB — HEPATITIS C ANTIBODY
Hepatitis C Ab: NONREACTIVE
SIGNAL TO CUT-OFF: 0.01 (ref <1.00)

## 2019-01-02 MED ORDER — B-12 1000 MCG SL SUBL: 1.0000 | SUBLINGUAL_TABLET | Freq: Every day | SUBLINGUAL | Status: DC

## 2019-01-02 MED ORDER — METFORMIN HCL ER 500 MG PO TB24: 1000.0000 mg | ORAL_TABLET | Freq: Every day | ORAL | 1 refills | Status: DC

## 2019-01-02 MED ORDER — VITAMIN D3 25 MCG (1000 UT) PO CAPS: 1.0000 | ORAL_CAPSULE | Freq: Every day | ORAL | Status: DC

## 2019-01-02 MED ORDER — VITAMIN D 125 MCG (5000 UT) PO CAPS: 1.0000 | ORAL_CAPSULE | Freq: Every day | ORAL | Status: DC

## 2019-01-02 NOTE — Assessment & Plan Note (Addendum)
Continue to encourage cessation. Contemplative.  Undergoing lung cancer screening CT program.  

## 2019-01-02 NOTE — Assessment & Plan Note (Signed)
Chronic, stable. Continue crestor 40mg  daily. The ASCVD Risk score Mikey Bussing DC Jr., et al., 2013) failed to calculate for the following reasons:   The valid total cholesterol range is 130 to 320 mg/dL

## 2019-01-02 NOTE — Progress Notes (Signed)
Virtual visit completed through [employer]. Due to national recommendations of social distancing due to [place] 19, a virtual visit is felt to be most appropriate for this patient at this time.   Patient location: home Provider location: [place] at [facility], office If any vitals were documented, they were collected by patient at home unless specified below.    Ht 5' 7" (1.702 m)   BMI 28.79 kg/m    CC: 6 mo f/u visit (overdue) Subjective:    Patient ID: Karen Osborne, female    DOB: [birth year], 63 y.o.   MRN: [id number]  HPI: Karen Osborne is a 63 y.o. female presenting on 01/02/2019 for Follow-up   She is now working from home.   DM - does regularly check sugars daily - last post meal 139. Compliant with antihyperglycemic regimen which includes: metformin 1045m bid - unable to tolerate GI symptoms BID so only taking 1 tab QHS. Denies low sugars or hypoglycemic symptoms. Rare stinging pain of soles. Last diabetic eye exam DUE. Pneumovax: 01/2012. Prevnar: not due. Glucometer brand: one touch. [facility], unable to afford to finish). Lab Results  Component Value Date   HGBA1C 6.9 (H) 12/30/2018   Diabetic Foot Exam - Simple   No data filed     No results found for: MICROALBUR, [id number]   Smoking - <1 ppd. Worse while at home.  Undergoing lung cancer screening CT program. Latest CT showed diffuse brochial wall thickening with mild centrilobular and paraseptal emphysema.      Relevant past medical, surgical, family and social history reviewed and updated as indicated. Interim medical history since our last visit reviewed. Allergies and medications reviewed and updated. Outpatient Medications Prior to Visit  Medication Sig Dispense Refill  . Blood Glucose Monitoring Suppl (ONE TOUCH ULTRA 2) w/Device KIT 1 each by Does not apply route daily. Use as directed to check sugar once a day 1 each 0  . carvedilol (COREG) 6.25 MG tablet TAKE 1 TABLET BY MOUTH  TWICE DAILY WITH A MEAL 180 tablet 2  . clopidogrel (PLAVIX) 75 MG tablet TAKE 1 TABLET BY MOUTH EVERY DAY 90 tablet 2  . glucose blood (ONE TOUCH ULTRA TEST) test strip 1 each by Other route daily. Use as instructed to check sugar once a day. 100 each 0  . ibandronate (BONIVA) 150 MG tablet Take 1 tab monthly, on an empty stomach in the AM with 8 oz tap water, and nothing else by mouth or lie down for the next 30 min. 12 tablet 3  . Ibuprofen-Diphenhydramine HCl (ADVIL PM) 200-25 MG CAPS Take 1 capsule by mouth at bedtime as needed.    .Marland Kitchenlisinopril (PRINIVIL,ZESTRIL) 20 MG tablet TAKE 1 TABLET BY MOUTH EVERY DAY. NEED APPT FOR FURTHER REFILLS 90 tablet 3  . nicotine (NICODERM CQ - DOSED IN MG/24 HOURS) 21 mg/24hr patch Place 1 patch (21 mg total) onto the skin daily. 30 patch 1  . ONETOUCH DELICA LANCETS 332RMISC 1 each by Does not apply route daily. Use as directed to check sugar once a day. 100 each 0  . rOPINIRole (REQUIP) 1 MG tablet TAKE 1-2 TABLETS (1-2 MG TOTAL) BY MOUTH AT BEDTIME. 180 tablet 0  . rosuvastatin (CRESTOR) 40 MG tablet Take 1 tablet (40 mg total) by mouth daily. 90 tablet 3  . tiotropium (SPIRIVA HANDIHALER) 18 MCG inhalation capsule Place 1 capsule (18 mcg total) into inhaler and inhale daily. 30 capsule 2  . metFORMIN (  GLUCOPHAGE) 1000 MG tablet TAKE 1 TABLET (1,000 MG TOTAL) BY MOUTH 2 (TWO) TIMES DAILY WITH A MEAL. 180 tablet 0  . Vitamin D, Ergocalciferol, (DRISDOL) 50000 units CAPS capsule Take 50,000 Units by mouth every 7 (seven) days.    . hydrochlorothiazide (MICROZIDE) 12.5 MG capsule Take 1 capsule (12.5 mg total) by mouth daily. 90 capsule 3  . nitroGLYCERIN (NITROSTAT) 0.4 MG SL tablet Place 1 tablet (0.4 mg total) under the tongue every 5 (five) minutes as needed for chest pain (up to 3 doses). 25 tablet 3   No facility-administered medications prior to visit.      Per HPI unless specifically indicated in ROS section below Review of Systems Objective:     Ht 5' 7" (1.702 m)   BMI 28.79 kg/m   Wt Readings from Last 3 Encounters:  11/15/18 183 lb 12.8 oz (83.4 kg)  08/19/18 179 lb (81.2 kg)  07/05/18 183 lb (83 kg)     Physical exam: Gen: alert, NAD, not ill appearing Pulm: speaks in complete sentences without increased work of breathing Psych: normal mood, normal thought content      Results for orders placed or performed in visit on 12/30/18  Hemoglobin A1c  Result Value Ref Range   Hgb A1c MFr Bld 6.9 (H) 4.6 - 6.5 %  Comprehensive metabolic panel  Result Value Ref Range   Sodium 137 135 - 145 mEq/L   Potassium 4.4 3.5 - 5.1 mEq/L   Chloride 100 96 - 112 mEq/L   CO2 27 19 - 32 mEq/L   Glucose, Bld 129 (H) 70 - 99 mg/dL   BUN 15 6 - 23 mg/dL   Creatinine, Ser 1.08 0.40 - 1.20 mg/dL   Total Bilirubin 0.4 0.2 - 1.2 mg/dL   Alkaline Phosphatase 78 39 - 117 U/L   AST 15 0 - 37 U/L   ALT 16 0 - 35 U/L   Total Protein 7.0 6.0 - 8.3 g/dL   Albumin 4.2 3.5 - 5.2 g/dL   Calcium 9.7 8.4 - 10.5 mg/dL   GFR 51.39 (L) >60.00 mL/min  Lipid panel  Result Value Ref Range   Cholesterol 104 0 - 200 mg/dL   Triglycerides 182.0 (H) 0.0 - 149.0 mg/dL   HDL 28.30 (L) >39.00 mg/dL   VLDL 36.4 0.0 - 40.0 mg/dL   LDL Cholesterol 39 0 - 99 mg/dL   Total CHOL/HDL Ratio 4    NonHDL 75.28   VITAMIN D 25 Hydroxy (Vit-D Deficiency, Fractures)  Result Value Ref Range   VITD 113.52 (HH) 30.00 - 100.00 ng/mL  Vitamin B12  Result Value Ref Range   Vitamin B-12 195 (L) 211 - 911 pg/mL   Assessment & Plan:   Problem List Items Addressed This Visit    Vitamin B12 deficiency    rec start b12 1000 mcg dissolvable tablets daily.       Type 2 diabetes mellitus with other specified complication (HCC) - Primary    Chronic, stable. Improved control. She has only been taking IR tab 1000 mg once daily. Change metformin to XR for better GI tolerability.       Relevant Medications   metFORMIN (GLUCOPHAGE XR) 500 MG 24 hr tablet   TOBACCO ABUSE     Continue to encourage cessation. Contemplative. Undergoing lung cancer screening CT program.       Thoracic aorta atherosclerosis ([place])   Osteoporosis    Over treating with vit D (was taking weekly Rx dosing + 5000 IU  BID). Advised start taking only 5000 IU once daily. Continue boniva monthly.       Relevant Medications   Cholecalciferol (VITAMIN D) 125 MCG (5000 UT) CAPS   HYPERCHOLESTEROLEMIA    Chronic, stable. Continue crestor 54m daily. The ASCVD Risk score (Mikey BussingDC Jr., et al., 2013) failed to calculate for the following reasons:   The valid total cholesterol range is 130 to 320 mg/dL       Elevated alkaline phosphatase level    Latest reading normal.           Meds ordered this encounter  Medications  . metFORMIN (GLUCOPHAGE XR) 500 MG 24 hr tablet    Sig: Take 2 tablets (1,000 mg total) by mouth daily.    Dispense:  180 tablet    Refill:  1    Note new formulation  . DISCONTD: Cholecalciferol (VITAMIN D3) 25 MCG (1000 UT) CAPS    Sig: Take 1 capsule (1,000 Units total) by mouth daily.    Dispense:  30 capsule  . Cyanocobalamin (B-12) 1000 MCG SUBL    Sig: Place 1 tablet under the tongue daily.    Dispense:  30 each  . Cholecalciferol (VITAMIN D) 125 MCG (5000 UT) CAPS    Sig: Take 1 capsule by mouth daily.   No orders of the defined types were placed in this encounter.   Patient Instructions  Nice to see you today! Change IR metformin to XR 10037monce daily. Schedule eye exam when you can.  Start oral b12 100082mdissolvable tabs  Decrease vitamin D3 to 5000 units once daily. Stop the weekly dose.  Return in 3 months for physical, virtual if needed.   Follow up plan: Return in about 3 months (around 04/03/2019) for annual exam, prior fasting for blood work.  JavRia BushD

## 2019-01-02 NOTE — Assessment & Plan Note (Signed)
Chronic, stable. Improved control. She has only been taking IR tab 1000 mg once daily. Change metformin to XR for better GI tolerability.

## 2019-01-02 NOTE — Patient Instructions (Addendum)
Nice to see you today! Change IR metformin to XR 1000mg  once daily. Schedule eye exam when you can.  Start oral b12 1058mcg dissolvable tabs  Decrease vitamin D3 to 5000 units once daily. Stop the weekly dose.  Return in 3 months for physical, virtual if needed.

## 2019-01-02 NOTE — Assessment & Plan Note (Signed)
rec start b12 1000 mcg dissolvable tablets daily.

## 2019-01-02 NOTE — Assessment & Plan Note (Signed)
Latest reading normal.

## 2019-01-02 NOTE — Assessment & Plan Note (Addendum)
Over treating with vit D (was taking weekly Rx dosing + 5000 IU BID). Advised start taking only 5000 IU once daily. Continue boniva monthly.

## 2019-01-23 ENCOUNTER — Other Ambulatory Visit: Payer: Self-pay | Admitting: Cardiovascular Disease

## 2019-02-01 ENCOUNTER — Ambulatory Visit: Payer: BLUE CROSS/BLUE SHIELD | Admitting: Family Medicine

## 2019-02-22 ENCOUNTER — Other Ambulatory Visit: Payer: Self-pay | Admitting: Family Medicine

## 2019-02-26 ENCOUNTER — Other Ambulatory Visit: Payer: Self-pay | Admitting: Physician Assistant

## 2019-02-26 DIAGNOSIS — I10 Essential (primary) hypertension: Secondary | ICD-10-CM

## 2019-03-23 ENCOUNTER — Other Ambulatory Visit: Payer: Self-pay | Admitting: Physician Assistant

## 2019-03-23 ENCOUNTER — Other Ambulatory Visit: Payer: Self-pay | Admitting: Cardiovascular Disease

## 2019-03-23 DIAGNOSIS — E7800 Pure hypercholesterolemia, unspecified: Secondary | ICD-10-CM

## 2019-03-23 DIAGNOSIS — I10 Essential (primary) hypertension: Secondary | ICD-10-CM

## 2019-03-23 DIAGNOSIS — I2510 Atherosclerotic heart disease of native coronary artery without angina pectoris: Secondary | ICD-10-CM

## 2019-03-23 MED ORDER — ROSUVASTATIN CALCIUM 40 MG PO TABS: 40.0000 mg | ORAL_TABLET | Freq: Every day | ORAL | 0 refills | Status: DC

## 2019-03-23 MED ORDER — HYDROCHLOROTHIAZIDE 12.5 MG PO CAPS: 12.5000 mg | ORAL_CAPSULE | Freq: Every day | ORAL | 0 refills | Status: DC

## 2019-03-29 DIAGNOSIS — M79674 Pain in right toe(s): Secondary | ICD-10-CM | POA: Diagnosis not present

## 2019-03-29 DIAGNOSIS — B351 Tinea unguium: Secondary | ICD-10-CM | POA: Diagnosis not present

## 2019-03-29 DIAGNOSIS — E119 Type 2 diabetes mellitus without complications: Secondary | ICD-10-CM | POA: Diagnosis not present

## 2019-03-29 DIAGNOSIS — L600 Ingrowing nail: Secondary | ICD-10-CM | POA: Diagnosis not present

## 2019-05-27 ENCOUNTER — Other Ambulatory Visit: Payer: Self-pay | Admitting: Cardiovascular Disease

## 2019-05-27 ENCOUNTER — Other Ambulatory Visit: Payer: Self-pay | Admitting: Physician Assistant

## 2019-05-27 DIAGNOSIS — E7800 Pure hypercholesterolemia, unspecified: Secondary | ICD-10-CM

## 2019-05-27 DIAGNOSIS — I10 Essential (primary) hypertension: Secondary | ICD-10-CM

## 2019-05-29 NOTE — Progress Notes (Signed)
Cardiology Office Note    Date:  05/30/2019   ID:  Karen Osborne, DOB 1956/03/11, MRN [id number]  PCP:  [name], MD  Cardiologist:  Dr. Burt Knack   Chief Complaint: 6  Months follow up  History of Present Illness:   Karen Osborne is a 63 y.o. female with hx ofCAD s/p NSTEMI 01/2012 tx with DES to mid LAD, HTN, HL, COPD, tobacco abuse and DM presents for follow up.  Echocardiogram in July 2018 demonstrated normal LV function with moderate diastolic dysfunction.  Stress test 12/2017: EF 74, tiny apicoseptal fixed defect unchanged from 2015 (likely attenuation artifact), normal perfusion, low risk.This was done for dyspnea evaluation.   She had chronic dyspnea which felt due to lung issue. Last seen by me 11/2018.  Here today for follow up.  Patient dyspnea on exertion without chest discomfort.  She had shoulder blade pain with radiation to left shoulder for non-STEMI in 2013.  She denies chest pain, dizziness, palpitation, orthopnea, PND, syncope or lower extremity edema.  Compliant with her medication.  She does not exercise regularly however walks at work.  Past Medical History:  Diagnosis Date  . BRCA negative 08/2015   MyRisk neg  . COPD (chronic obstructive pulmonary disease) ([place]) 2019  . Coronary artery disease    a. s/p NSTEMI - LHC 01/27/12: prox LAD 20%, mid LAD 99%, prox OM 30%, prox RCA 30%, mid RCA 50%, dist RCA 30%, EF 60%. PCI: 2.25 x 16 mm Promus Element DES to mid LAD //  b. Myoview (7/15): Fixed apical defect (breast attenuation versus scar), no ischemia, EF 64%; low risk   . Depression   . Diabetes mellitus without complication ([place]) 5397  . Family history of ovarian cancer   . Glucose intolerance (impaired glucose tolerance)    A1C 6.1 01/2012  . History of echocardiogram    Echo (01/28/12): Mild LVH, EF 55%, apical septum and true apex severe HK, Gr 2 DD, mild LAE, normal RVF // Echo 7/18: mild LVH, EF 60-65, no RWMA, Gr 2 DD, normal RVF  . History of  nuclear stress test    Nuclear stress test 4/19: EF 74, apicoseptal defect c/w atten artifact, normal perfusion; Low Risk  . Hypercholesteremia   . Hypertension   . Myocardial infarction ([place])   . NSVD (normal spontaneous vaginal delivery) 1988  . Osteopenia 2016   hip  . Osteoporosis 2019   in spine and femoral neck; at [facility]  . Restless leg syndrome   . Tobacco abuse     Past Surgical History:  Procedure Laterality Date  . ABDOMINAL HYSTERECTOMY     TAHBSO  . COLONOSCOPY  10/2006   TA, [facility] (Dr Vira Agar)  . COLONOSCOPY WITH PROPOFOL N/A 08/19/2018   TAs, rpt 3 yrs (Tahiliani, Lennette Bihari, MD)  . CORONARY ANGIOPLASTY WITH STENT PLACEMENT  01/27/2012   DES to LAD  . gyn surgery  02/1998   total hysterectomy, abn pap smear, no cancer  . LEFT HEART CATHETERIZATION WITH CORONARY ANGIOGRAM N/A 01/27/2012   Procedure: LEFT HEART CATHETERIZATION WITH CORONARY ANGIOGRAM;  Surgeon: Burnell Blanks, MD;  Location: [facility] CATH LAB;  Service: Cardiovascular;  Laterality: N/A;  . PERCUTANEOUS CORONARY STENT INTERVENTION (PCI-S)  01/27/2012   Procedure: PERCUTANEOUS CORONARY STENT INTERVENTION (PCI-S);  Surgeon: Burnell Blanks, MD;  Location: [facility] CATH LAB;  Service: Cardiovascular;;    Current Medications: Prior to Admission medications   Medication Sig Start Date End Date Taking? Authorizing Provider  Blood  Glucose Monitoring Suppl (ONE TOUCH ULTRA 2) w/Device KIT 1 each by Does not apply route daily. Use as directed to check sugar once a day 01/12/18   [name], MD  carvedilol (COREG) 6.25 MG tablet TAKE 1 TABLET BY MOUTH TWICE DAILY WITH A MEAL 03/23/19   Sherren Mocha, MD  Cholecalciferol (VITAMIN D) 125 MCG (5000 UT) CAPS Take 1 capsule by mouth daily. 01/02/19   [name], MD  clopidogrel (PLAVIX) 75 MG tablet TAKE 1 TABLET BY MOUTH EVERY DAY 01/23/19   Sherren Mocha, MD  Cyanocobalamin (B-12) 1000 MCG SUBL Place 1 tablet under the tongue daily. 01/02/19   [name], MD  glucose blood (ONE TOUCH ULTRA TEST) test strip 1 each by Other route daily. Use as instructed to check sugar once a day. 01/12/18   [name], MD  hydrochlorothiazide (MICROZIDE) 12.5 MG capsule Take 1 capsule (12.5 mg total) by mouth daily. 03/23/19 06/21/19  Sherren Mocha, MD  ibandronate (BONIVA) 150 MG tablet Take 1 tab monthly, on an empty stomach in the AM with 8 oz tap water, and nothing else by mouth or lie down for the next 30 min. 76/1/60   Copland, Deirdre Evener, PA-C  Ibuprofen-Diphenhydramine HCl (ADVIL PM) 200-25 MG CAPS Take 1 capsule by mouth at bedtime as needed. 05/28/16   [name], MD  lisinopril (ZESTRIL) 20 MG tablet TAKE 1 TABLET BY MOUTH EVERY DAY 05/29/19   Richardson Dopp T, PA-C  metFORMIN (GLUCOPHAGE XR) 500 MG 24 hr tablet Take 2 tablets (1,000 mg total) by mouth daily. 01/02/19   [name], MD  nitroGLYCERIN (NITROSTAT) 0.4 MG SL tablet Place 1 tablet (0.4 mg total) under the tongue every 5 (five) minutes as needed for chest pain (up to 3 doses). 05/29/15 12/18/17  Richardson Dopp T, PA-C  ONETOUCH DELICA LANCETS 73X MISC 1 each by Does not apply route daily. Use as directed to check sugar once a day. 01/12/18   [name], MD  rOPINIRole (REQUIP) 1 MG tablet TAKE 1-2 TABLETS (1-2 MG TOTAL) BY MOUTH AT BEDTIME. 02/22/19   [name], MD  rosuvastatin (CRESTOR) 40 MG tablet TAKE 1 TABLET BY MOUTH EVERY DAY 05/29/19   Richardson Dopp T, PA-C  tiotropium (SPIRIVA HANDIHALER) 18 MCG inhalation capsule Place 1 capsule (18 mcg total) into inhaler and inhale daily. 04/07/18 04/07/19  Laverle Hobby, MD    Allergies:   Penicillins   Social History   Socioeconomic History  . Marital status: Married    Spouse name: Not on file  . Number of children: 1  . Years of education: Not on file  . Highest education level: Not on file  Occupational History  . 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  Financial resource strain: Not on file  . Food insecurity    Worry: Not on file    Inability: Not on file  . Transportation needs    Medical: Not on file    Non-medical: Not on file  Tobacco Use  . Smoking status: Current Every Day Smoker    Packs/day: 1.50    Years: 48.40    Pack years: 72.60    Types: Cigarettes  . Smokeless tobacco: Never Used  Substance and Sexual Activity  . Alcohol use: No  . Drug use: No  . Sexual activity: Yes    Birth control/protection: Surgical    Comment: Hysterectomy  Lifestyle  . Physical activity    Days per  week: Not on file    Minutes per session: Not on file  . Stress: Not on file  Relationships  . Social Herbalist on phone: Not on file    Gets together: Not on file    Attends religious service: Not on file    Active member of club or organization: Not on file    Attends meetings of clubs or organizations: Not on file    Relationship status: Not on file  Other Topics Concern  . Not on file  Social History Narrative   Works in Press photographer (Designer, television/film set and valves)     Family History:  The patient's family history includes Diabetes in her sister; Heart attack in her father; Heart disease (age of onset: 59) in her father; Hypertension in her father; Kidney cancer in her father; Ovarian cancer (age of onset: 71) in her maternal aunt; Prostate cancer in her father.   ROS:   Please see the history of present illness.    ROS All other systems reviewed and are negative.   PHYSICAL EXAM:   VS:  BP 120/70   Pulse 77   Ht _0  (1.702 m)   Wt 189 lb 6.4 oz (85.9 kg)   SpO2 95%   BMI 29.66 kg/m    GEN: Well nourished, well developed, in no acute distress  HEENT: normal  Neck: no JVD, carotid bruits, or masses Cardiac: RRR; no murmurs, rubs, or gallops,no edema  Respiratory:  clear to auscultation bilaterally, normal work of breathing GI: soft, nontender, nondistended, + BS MS: no deformity or atrophy  Skin: warm and dry, no  rash Neuro:  Alert and Oriented x 3, Strength and sensation are intact Psych: euthymic mood, full affect  Wt Readings from Last 3 Encounters:  05/30/19 189 lb 6.4 oz (85.9 kg)  11/15/18 183 lb 12.8 oz (83.4 kg)  08/19/18 179 lb (81.2 kg)      Studies/Labs Reviewed:   EKG:  EKG is not ordered today.   Recent Labs: 12/30/2018: ALT 16; BUN 15; Creatinine, Ser 1.08; Potassium 4.4; Sodium 137   Lipid Panel    Component Value Date/Time   CHOL 104 12/30/2018 0825   CHOL 123 06/25/2017 0734   TRIG 182.0 (H) 12/30/2018 0825   HDL 28.30 (L) 12/30/2018 0825   HDL 28 (L) 06/25/2017 0734   CHOLHDL 4 12/30/2018 0825   VLDL 36.4 12/30/2018 0825   LDLCALC 39 12/30/2018 0825   LDLCALC 64 06/25/2017 0734    Additional studies/ records that were reviewed today include:   Stress test 12/2017  Nuclear stress EF: 74%.  Defect 1: There is a small defect of mild severity present in the apical septal location.  This is a low risk study.   Low risk stress nuclear study with normal perfusion and normal left ventricular regional and global systolic function. A tiny apicoseptal fixed defect is unchanged from 2015 and is most likely due to attenuation artifact.   ASSESSMENT & PLAN:    1. Dyspnea on exertion She denies exertional chest tightness/pressure.  Her symptoms is totally different when she had a non-STEMI in 2013. Suspect her dyspnea likely due to pulmonary issue given ongoing tobacco smoking and COPD.  No active wheezing.  Echocardiogram in 2018 showed preserved LV function with stiffness of heart muscle.  She had a low risk study last year.  Certainly she could have worsening diastolic dysfunction or underlying CAD.  However, she does not have any signs of volume overload  or anginal pain.  Discussed up titration of hydrochlorothiazide as a trial.  She declined and wants to discuss with PCP next week.  She will follow-up with pulmonary for further evaluation. -Consider echocardiogram  +/-stress test if continues to have symptoms based on pulmonologist recommendation.  2.  CAD -As above.  Continue Coreg, lisinopril, Plavix and Crestor.  3.  Hypertension -Blood pressure stable on current medication.  4.  Tobacco smoking -She smokes less than 1 pack a day.  Used to smoke more than 2 packs a day.  Recommended complete cessation.  Declined nicotine patch/Chantix.    Medication Adjustments/Labs and Tests Ordered: Current medicines are reviewed at length with the patient today.  Concerns regarding medicines are outlined above.  Medication changes, Labs and Tests ordered today are listed in the Patient Instructions below. Patient Instructions  Medication Instructions:  Your physician recommends that you continue on your current medications as directed. Please refer to the Current Medication list given to you today.  If you need a refill on your cardiac medications before your next appointment, please call your pharmacy.   Lab work: None ordered  If you have labs (blood work) drawn today and your tests are completely normal, you will receive your results only by: Marland Kitchen MyChart Message (if you have MyChart) OR . A paper copy in the mail If you have any lab test that is abnormal or we need to change your treatment, we will call you to review the results.  Testing/Procedures: None ordered  Follow-Up: At [facility], you and your health needs are our priority.  As part of our continuing mission to provide you with exceptional heart care, we have created designated Provider Care Teams.  These Care Teams include your primary Cardiologist (physician) and Advanced Practice Providers (APPs -  Physician Assistants and Nurse Practitioners) who all work together to provide you with the care you need, when you need it. You will need a follow up appointment in:  3-4 months.  Please call our office 2 months in advance to schedule this appointment.  You may see Sherren Mocha, MD or one  of the following Advanced Practice Providers on your designated Care Team: Richardson Dopp, PA-C [place], [place] . Daune Perch, NP  Any Other Special Instructions Will Be Listed Below (If Applicable).       Jarrett Soho, [place]  05/30/2019 10:17 AM    [facility] [address], [place], [place]  [address] Phone: [phone]; Fax: [phone]

## 2019-05-30 ENCOUNTER — Other Ambulatory Visit: Payer: Self-pay

## 2019-05-30 ENCOUNTER — Encounter: Payer: Self-pay | Admitting: Physician Assistant

## 2019-05-30 ENCOUNTER — Ambulatory Visit (INDEPENDENT_AMBULATORY_CARE_PROVIDER_SITE_OTHER): Payer: BC Managed Care – PPO | Admitting: Physician Assistant

## 2019-05-30 VITALS — BP 120/70 | HR 77 | Ht 67.0 in | Wt 189.4 lb

## 2019-05-30 DIAGNOSIS — R0602 Shortness of breath: Secondary | ICD-10-CM | POA: Diagnosis not present

## 2019-05-30 DIAGNOSIS — I2510 Atherosclerotic heart disease of native coronary artery without angina pectoris: Secondary | ICD-10-CM | POA: Diagnosis not present

## 2019-05-30 DIAGNOSIS — F17200 Nicotine dependence, unspecified, uncomplicated: Secondary | ICD-10-CM

## 2019-05-30 DIAGNOSIS — I10 Essential (primary) hypertension: Secondary | ICD-10-CM

## 2019-05-30 NOTE — Patient Instructions (Addendum)
Medication Instructions:  Your physician recommends that you continue on your current medications as directed. Please refer to the Current Medication list given to you today.  If you need a refill on your cardiac medications before your next appointment, please call your pharmacy.   Lab work: None ordered  If you have labs (blood work) drawn today and your tests are completely normal, you will receive your results only by: Marland Kitchen MyChart Message (if you have MyChart) OR . A paper copy in the mail If you have any lab test that is abnormal or we need to change your treatment, we will call you to review the results.  Testing/Procedures: None ordered  Follow-Up: At [facility], you and your health needs are our priority.  As part of our continuing mission to provide you with exceptional heart care, we have created designated Provider Care Teams.  These Care Teams include your primary Cardiologist (physician) and Advanced Practice Providers (APPs -  Physician Assistants and Nurse Practitioners) who all work together to provide you with the care you need, when you need it. You will need a follow up appointment in:  3-4 months.  Please call our office 2 months in advance to schedule this appointment.  You may see Sherren Mocha, MD or one of the following Advanced Practice Providers on your designated Care Team: Richardson Dopp, PA-C [place], [place] . Daune Perch, NP  Any Other Special Instructions Will Be Listed Below (If Applicable).

## 2019-06-03 DIAGNOSIS — Z23 Encounter for immunization: Secondary | ICD-10-CM | POA: Diagnosis not present

## 2019-06-17 ENCOUNTER — Other Ambulatory Visit: Payer: Self-pay | Admitting: Family Medicine

## 2019-06-17 ENCOUNTER — Other Ambulatory Visit: Payer: Self-pay | Admitting: Cardiovascular Disease

## 2019-06-17 DIAGNOSIS — I2510 Atherosclerotic heart disease of native coronary artery without angina pectoris: Secondary | ICD-10-CM

## 2019-06-17 DIAGNOSIS — I10 Essential (primary) hypertension: Secondary | ICD-10-CM

## 2019-06-19 ENCOUNTER — Telehealth: Payer: Self-pay | Admitting: *Deleted

## 2019-06-19 ENCOUNTER — Encounter: Payer: Self-pay | Admitting: *Deleted

## 2019-06-19 DIAGNOSIS — Z87891 Personal history of nicotine dependence: Secondary | ICD-10-CM

## 2019-06-19 DIAGNOSIS — Z122 Encounter for screening for malignant neoplasm of respiratory organs: Secondary | ICD-10-CM

## 2019-06-19 NOTE — Addendum Note (Signed)
Addended by: Lieutenant Diego on: 06/19/2019 12:35 PM   Modules accepted: Orders

## 2019-06-19 NOTE — Telephone Encounter (Signed)
Patient has been notified that annual lung cancer screening low dose CT scan is due currently or will be in near future. Confirmed that patient is within the age range of 55-77, and asymptomatic, (no signs or symptoms of lung cancer). Patient denies illness that would prevent curative treatment for lung cancer if found. Verified smoking history, (current, 72.5 pack year). The shared decision making visit was done 06/23/18. Patient is agreeable for CT scan being scheduled.

## 2019-06-19 NOTE — Telephone Encounter (Signed)
Left message for patient to notify them that it is time to schedule annual low dose lung cancer screening CT scan. Instructed patient to call back to verify information prior to the scan being scheduled.  

## 2019-06-26 ENCOUNTER — Ambulatory Visit: Admission: RE | Admit: 2019-06-26 | Payer: BC Managed Care – PPO | Source: Ambulatory Visit

## 2019-07-03 ENCOUNTER — Other Ambulatory Visit: Payer: Self-pay

## 2019-07-03 ENCOUNTER — Ambulatory Visit
Admission: RE | Admit: 2019-07-03 | Discharge: 2019-07-03 | Disposition: A | Discharge status: Home / Self Care | Payer: BC Managed Care – PPO | Source: Ambulatory Visit | Attending: Oncology | Admitting: Oncology

## 2019-07-03 DIAGNOSIS — F17210 Nicotine dependence, cigarettes, uncomplicated: Secondary | ICD-10-CM | POA: Diagnosis not present

## 2019-07-03 DIAGNOSIS — Z87891 Personal history of nicotine dependence: Secondary | ICD-10-CM

## 2019-07-03 DIAGNOSIS — Z122 Encounter for screening for malignant neoplasm of respiratory organs: Secondary | ICD-10-CM

## 2019-07-05 ENCOUNTER — Encounter: Payer: Self-pay | Admitting: *Deleted

## 2019-08-17 DIAGNOSIS — D2262 Melanocytic nevi of left upper limb, including shoulder: Secondary | ICD-10-CM | POA: Diagnosis not present

## 2019-08-17 DIAGNOSIS — D225 Melanocytic nevi of trunk: Secondary | ICD-10-CM | POA: Diagnosis not present

## 2019-08-17 DIAGNOSIS — L821 Other seborrheic keratosis: Secondary | ICD-10-CM | POA: Diagnosis not present

## 2019-08-17 DIAGNOSIS — D485 Neoplasm of uncertain behavior of skin: Secondary | ICD-10-CM | POA: Diagnosis not present

## 2019-08-17 DIAGNOSIS — D2261 Melanocytic nevi of right upper limb, including shoulder: Secondary | ICD-10-CM | POA: Diagnosis not present

## 2019-09-01 DIAGNOSIS — D2362 Other benign neoplasm of skin of left upper limb, including shoulder: Secondary | ICD-10-CM | POA: Diagnosis not present

## 2019-09-01 DIAGNOSIS — D2262 Melanocytic nevi of left upper limb, including shoulder: Secondary | ICD-10-CM | POA: Diagnosis not present

## 2019-09-16 ENCOUNTER — Other Ambulatory Visit: Payer: Self-pay | Admitting: Family Medicine

## 2019-09-27 ENCOUNTER — Encounter: Payer: Self-pay | Admitting: Cardiology

## 2019-09-27 ENCOUNTER — Other Ambulatory Visit: Payer: Self-pay

## 2019-09-27 ENCOUNTER — Telehealth (INDEPENDENT_AMBULATORY_CARE_PROVIDER_SITE_OTHER): Payer: BC Managed Care – PPO | Admitting: Cardiology

## 2019-09-27 ENCOUNTER — Telehealth: Payer: Self-pay

## 2019-09-27 VITALS — Ht 67.0 in

## 2019-09-27 DIAGNOSIS — J449 Chronic obstructive pulmonary disease, unspecified: Secondary | ICD-10-CM

## 2019-09-27 DIAGNOSIS — F17200 Nicotine dependence, unspecified, uncomplicated: Secondary | ICD-10-CM

## 2019-09-27 DIAGNOSIS — E785 Hyperlipidemia, unspecified: Secondary | ICD-10-CM

## 2019-09-27 DIAGNOSIS — I2510 Atherosclerotic heart disease of native coronary artery without angina pectoris: Secondary | ICD-10-CM | POA: Diagnosis not present

## 2019-09-27 DIAGNOSIS — I10 Essential (primary) hypertension: Secondary | ICD-10-CM

## 2019-09-27 NOTE — Telephone Encounter (Signed)
Pt has given her verbal consent for phone call visit. Pt does not have capability to take vitals at home. Pt states last weight was about two weeks ago at about 185lb.   YOUR CARDIOLOGY TEAM HAS ARRANGED FOR AN E-VISIT FOR YOUR APPOINTMENT - PLEASE REVIEW IMPORTANT INFORMATION BELOW SEVERAL DAYS PRIOR TO YOUR APPOINTMENT  Due to the recent COVID-19 pandemic, we are transitioning in-person office visits to tele-medicine visits in an effort to decrease unnecessary exposure to our patients, their families, and staff. These visits are billed to your insurance just like a normal visit is. We also encourage you to sign up for MyChart if you have not already done so. You will need a smartphone if possible. For patients that do not have this, we can still complete the visit using a regular telephone but do prefer a smartphone to enable video when possible. You may have a family member that lives with you that can help. If possible, we also ask that you have a blood pressure cuff and scale at home to measure your blood pressure, heart rate and weight prior to your scheduled appointment. Patients with clinical needs that need an in-person evaluation and testing will still be able to come to the office if absolutely necessary. If you have any questions, feel free to call our office.     YOUR PROVIDER WILL BE USING THE FOLLOWING PLATFORM TO COMPLETE YOUR VISIT: Phone Call . IF USING MYCHART - How to Download the MyChart App to Your SmartPhone   - If Apple, go to [employer] and type in MyChart in the search bar and download the app. If Android, ask patient to go to [employer] and type in [place] in the search bar and download the app. The app is free but as with any other app downloads, your phone may require you to verify saved payment information or Apple/Android password.  - You will need to then log into the app with your MyChart username and password, and select [facility] as your healthcare provider to  link the account.  - When it is time for your visit, go to the MyChart app, find appointments, and click Begin Video Visit. Be sure to Select Allow for your device to access the Microphone and Camera for your visit. You will then be connected, and your provider will be with you shortly.  **If you have any issues connecting or need assistance, please contact MyChart service desk [phone])**  **If using a computer, in order to ensure the best quality for your visit, you will need to use either of the following Internet Browsers: Insurance underwriter or [employer]**  . IF USING DOXIMITY or DOXY.ME - The staff will give you instructions on receiving your link to join the meeting the day of your visit.      2-3 DAYS BEFORE YOUR APPOINTMENT  You will receive a telephone call from one of our [facility] team members - your caller ID may say "Unknown caller." If this is a video visit, we will walk you through how to get the video launched on your phone. We will remind you check your blood pressure, heart rate and weight prior to your scheduled appointment. If you have an Apple Watch or K[place]ia, please upload any pertinent ECG strips the day before or morning of your appointment to [place]. Our staff will also make sure you have reviewed the consent and agree to move forward with your scheduled tele-health visit.  THE DAY OF YOUR APPOINTMENT  Approximately 15 minutes prior to your scheduled appointment, you will receive a telephone call from one of [facility] team - your caller ID may say "Unknown caller."  Our staff will confirm medications, vital signs for the day and any symptoms you may be experiencing. Please have this information available prior to the time of visit start. It may also be helpful for you to have a pad of paper and pen handy for any instructions given during your visit. They will also walk you through joining the smartphone meeting if this is a video visit.    CONSENT  FOR TELE-HEALTH VISIT - PLEASE REVIEW  I hereby voluntarily request, consent and authorize [facility] and its employed or contracted physicians, physician assistants, nurse practitioners or other licensed health care professionals (the Practitioner), to provide me with telemedicine health care services (the "Services") as deemed necessary by the treating Practitioner. I acknowledge and consent to receive the Services by the Practitioner via telemedicine. I understand that the telemedicine visit will involve communicating with the Practitioner through live audiovisual communication technology and the disclosure of certain medical information by electronic transmission. I acknowledge that I have been given the opportunity to request an in-person assessment or other available alternative prior to the telemedicine visit and am voluntarily participating in the telemedicine visit.  I understand that I have the right to withhold or withdraw my consent to the use of telemedicine in the course of my care at any time, without affecting my right to future care or treatment, and that the Practitioner or I may terminate the telemedicine visit at any time. I understand that I have the right to inspect all information obtained and/or recorded in the course of the telemedicine visit and may receive copies of available information for a reasonable fee.  I understand that some of the potential risks of receiving the Services via telemedicine include:  Marland Kitchen Delay or interruption in medical evaluation due to technological equipment failure or disruption; . Information transmitted may not be sufficient (e.g. poor resolution of images) to allow for appropriate medical decision making by the Practitioner; and/or  . In rare instances, security protocols could fail, causing a breach of personal health information.  Furthermore, I acknowledge that it is my responsibility to provide information about my medical history, conditions  and care that is complete and accurate to the best of my ability. I acknowledge that Practitioner's advice, recommendations, and/or decision may be based on factors not within their control, such as incomplete or inaccurate data provided by me or distortions of diagnostic images or specimens that may result from electronic transmissions. I understand that the practice of medicine is not an exact science and that Practitioner makes no warranties or guarantees regarding treatment outcomes. I acknowledge that I will receive a copy of this consent concurrently upon execution via email to the email address I last provided but may also request a printed copy by calling the office of [facility].    I understand that my insurance will be billed for this visit.   I have read or had this consent read to me. . I understand the contents of this consent, which adequately explains the benefits and risks of the Services being provided via telemedicine.  . I have been provided ample opportunity to ask questions regarding this consent and the Services and have had my questions answered to my satisfaction. . I give my informed consent for the services to be provided through the  use of telemedicine in my medical care  By participating in this telemedicine visit I agree to the above.

## 2019-09-27 NOTE — Patient Instructions (Addendum)
Medication Instructions:  Your physician recommends that you continue on your current medications as directed. Please refer to the Current Medication list given to you today.  *If you need a refill on your cardiac medications before your next appointment, please call your pharmacy*  Lab Work: None ordered  If you have labs (blood work) drawn today and your tests are completely normal, you will receive your results only by: Marland Kitchen MyChart Message (if you have MyChart) OR . A paper copy in the mail If you have any lab test that is abnormal or we need to change your treatment, we will call you to review the results.  Testing/Procedures: None ordered  Follow-Up: At [facility], you and your health needs are our priority.  As part of our continuing mission to provide you with exceptional heart care, we have created designated Provider Care Teams.  These Care Teams include your primary Cardiologist (physician) and Advanced Practice Providers (APPs -  Physician Assistants and Nurse Practitioners) who all work together to provide you with the care you need, when you need it.  Your next appointment:   12 month(s)  The format for your next appointment:   In Person  Provider:   Richardson Dopp, PA-C or Daune Perch, NP  Other Instructions;  Lifestyle Modifications to Prevent and Treat Heart Disease -Recommend heart healthy/Mediterranean diet, with whole grains, fruits, vegetables, fish, lean meats, nuts, olive oil and avocado oil.  -Limit salt intake to less than 2000 mg per day.  -Recommend moderate walking, starting slowly with a few minutes and working up to 3-5 times/week for 30-50 minutes each session. Aim for at least 150 minutes.week. Goal should be pace of 3 miles/hours, or walking 1.5 miles in 30 minutes -Recommend avoidance of tobacco products. Avoid excess alcohol. -Keep blood pressure well controlled, ideally less than 130/80.  ======================   Smoking Tobacco  Information, Adult Smoking tobacco can be harmful to your health. Tobacco contains a poisonous (toxic), colorless chemical called nicotine. Nicotine is addictive. It changes the brain and can make it hard to stop smoking. Tobacco also has other toxic chemicals that can hurt your body and raise your risk of many cancers. How can smoking tobacco affect me? Smoking tobacco puts you at risk for:  Cancer. Smoking is most commonly associated with lung cancer, but can also lead to cancer in other parts of the body.  Chronic obstructive pulmonary disease (COPD). This is a long-term lung condition that makes it hard to breathe. It also gets worse over time.  High blood pressure (hypertension), heart disease, stroke, or heart attack.  Lung infections, such as pneumonia.  Cataracts. This is when the lenses in the eyes become clouded.  Digestive problems. This may include peptic ulcers, heartburn, and gastroesophageal reflux disease (GERD).  Oral health problems, such as gum disease and tooth loss.  Loss of taste and smell. Smoking can affect your appearance by causing:  Wrinkles.  Yellow or stained teeth, fingers, and fingernails. Smoking tobacco can also affect your social life, because:  It may be challenging to find places to smoke when away from home. Many workplaces, [employer], hotels, and public places are tobacco-free.  Smoking is expensive. This is due to the cost of tobacco and the long-term costs of treating health problems from smoking.  Secondhand smoke may affect those around you. Secondhand smoke can cause lung cancer, breathing problems, and heart disease. Children of smokers have a higher risk for: ? Sudden infant death syndrome (SIDS). ? Ear infections. ?  Lung infections. If you currently smoke tobacco, quitting now can help you:  Lead a longer and healthier life.  Look, smell, breathe, and feel better over time.  Save money.  Protect others from the harms of  secondhand smoke. What actions can I take to prevent health problems? Quit smoking   Do not start smoking. Quit if you already do.  Make a plan to quit smoking and commit to it. Look for programs to help you and ask your health care provider for recommendations and ideas.  Set a date and write down all the reasons you want to quit.  Let your friends and family know you are quitting so they can help and support you. Consider finding friends who also want to quit. It can be easier to quit with someone else, so that you can support each other.  Talk with your health care provider about using nicotine replacement medicines to help you quit, such as gum, lozenges, patches, sprays, or pills.  Do not replace cigarette smoking with electronic cigarettes, which are commonly called e-cigarettes. The safety of e-cigarettes is not known, and some may contain harmful chemicals.  If you try to quit but return to smoking, stay positive. It is common to slip up when you first quit, so take it one day at a time.  Be prepared for cravings. When you feel the urge to smoke, chew gum or suck on hard candy. Lifestyle  Stay busy and take care of your body.  Drink enough fluid to keep your urine pale yellow.  Get plenty of exercise and eat a healthy diet. This can help prevent weight gain after quitting.  Monitor your eating habits. Quitting smoking can cause you to have a larger appetite than when you smoke.  Find ways to relax. Go out with friends or family to a movie or a restaurant where people do not smoke.  Ask your health care provider about having regular tests (screenings) to check for cancer. This may include blood tests, imaging tests, and other tests.  Find ways to manage your stress, such as meditation, yoga, or exercise. Where to find support To get support to quit smoking, consider:  Asking your health care provider for more information and resources.  Taking classes to learn more  about quitting smoking.  Looking for local organizations that offer resources about quitting smoking.  Joining a support group for people who want to quit smoking in your local community.  Calling the smokefree.gov counselor helpline: 1-800-Quit-Now 530-823-2582) Where to find more information You may find more information about quitting smoking from:  HelpGuide.org: www.helpguide.org  https://hall.com/: smokefree.gov  American Lung Association: www.lung.org Contact a health care provider if you:  Have problems breathing.  Notice that your lips, nose, or fingers turn blue.  Have chest pain.  Are coughing up blood.  Feel faint or you pass out.  Have other health changes that cause you to worry. Summary  Smoking tobacco can negatively affect your health, the health of those around you, your finances, and your social life.  Do not start smoking. Quit if you already do. If you need help quitting, ask your health care provider.  Think about joining a support group for people who want to quit smoking in your local community. There are many effective programs that will help you to quit this behavior. This information is not intended to replace advice given to you by your health care provider. Make sure you discuss any questions you have with your health  care provider. Document Revised: 05/26/2019 Document Reviewed: 09/15/2016 Elsevier Patient Education  2020 [place] Modifications to Prevent and Treat Heart Disease -Recommend heart healthy/Mediterranean diet, with whole grains, fruits, vegetables, fish, lean meats, nuts, olive oil and avocado oil.  -Limit salt intake to less than 2000 mg per day.  -Recommend moderate walking, starting slowly with a few minutes and working up to 3-5 times/week for 30-50 minutes each session. Aim for at least 150 minutes.week. Goal should be pace of 3 miles/hours, or walking 1.5 miles in 30 minutes -Recommend avoidance of tobacco  products. Avoid excess alcohol. -Keep blood pressure well controlled, ideally less than 130/80.     Mediterranean Diet A Mediterranean diet refers to food and lifestyle choices that are based on the traditions of countries located on the [employer]. This way of eating has been shown to help prevent certain conditions and improve outcomes for people who have chronic diseases, like kidney disease and heart disease. What are tips for following this plan? Lifestyle  Cook and eat meals together with your family, when possible.  Drink enough fluid to keep your urine clear or pale yellow.  Be physically active every day. This includes: ? Aerobic exercise like running or swimming. ? Leisure activities like gardening, walking, or housework.  Get 7-8 hours of sleep each night.  If recommended by your health care provider, drink red wine in moderation. This means 1 glass a day for nonpregnant women and 2 glasses a day for men. A glass of wine equals 5 oz (150 mL). Reading food labels   Check the serving size of packaged foods. For foods such as rice and pasta, the serving size refers to the amount of cooked product, not dry.  Check the total fat in packaged foods. Avoid foods that have saturated fat or trans fats.  Check the ingredients list for added sugars, such as corn syrup. Shopping  At the grocery store, buy most of your food from the areas near the walls of the store. This includes: ? Fresh fruits and vegetables (produce). ? Grains, beans, nuts, and seeds. Some of these may be available in unpackaged forms or large amounts (in bulk). ? Fresh seafood. ? Poultry and eggs. ? Low-fat dairy products.  Buy whole ingredients instead of prepackaged foods.  Buy fresh fruits and vegetables in-season from local farmers markets.  Buy frozen fruits and vegetables in resealable bags.  If you do not have access to quality fresh seafood, buy precooked frozen shrimp or canned fish, such  as tuna, salmon, or sardines.  Buy small amounts of raw or cooked vegetables, salads, or olives from the deli or salad bar at your store.  Stock your pantry so you always have certain foods on hand, such as olive oil, canned tuna, canned tomatoes, rice, pasta, and beans. Cooking  Cook foods with extra-virgin olive oil instead of using butter or other vegetable oils.  Have meat as a side dish, and have vegetables or grains as your main dish. This means having meat in small portions or adding small amounts of meat to foods like pasta or stew.  Use beans or vegetables instead of meat in common dishes like chili or lasagna.  Experiment with different cooking methods. Try roasting or broiling vegetables instead of steaming or sauteing them.  Add frozen vegetables to soups, stews, pasta, or rice.  Add nuts or seeds for added healthy fat at each meal. You can add these to yogurt, salads, or vegetable dishes.  Marinate fish or vegetables using olive oil, lemon juice, garlic, and fresh herbs. Meal planning   Plan to eat 1 vegetarian meal one day each week. Try to work up to 2 vegetarian meals, if possible.  Eat seafood 2 or more times a week.  Have healthy snacks readily available, such as: ? Vegetable sticks with hummus. ? [place] yogurt. ? Fruit and nut trail mix.  Eat balanced meals throughout the week. This includes: ? Fruit: 2-3 servings a day ? Vegetables: 4-5 servings a day ? Low-fat dairy: 2 servings a day ? Fish, poultry, or lean meat: 1 serving a day ? Beans and legumes: 2 or more servings a week ? Nuts and seeds: 1-2 servings a day ? Whole grains: 6-8 servings a day ? Extra-virgin olive oil: 3-4 servings a day  Limit red meat and sweets to only a few servings a month What are my food choices?  Mediterranean diet ? Recommended  Grains: Whole-grain pasta. Brown rice. Bulgar wheat. Polenta. Couscous. Whole-wheat bread. [place].  Vegetables: Artichokes. Beets.  Broccoli. Cabbage. Carrots. Eggplant. Green beans. Chard. Kale. Spinach. Onions. Leeks. Peas. Squash. Tomatoes. Peppers. Radishes.  Fruits: Apples. Apricots. Avocado. Berries. Bananas. Cherries. Dates. Figs. Grapes. Lemons. Melon. Oranges. Peaches. Plums. Pomegranate.  Meats and other protein foods: Beans. Almonds. Sunflower seeds. Pine nuts. Peanuts. [place]. Salmon. Scallops. Shrimp. [place]. Tilapia. Clams. Oysters. Eggs.  Dairy: Low-fat milk. Cheese. Greek yogurt.  Beverages: Water. Red wine. Herbal tea.  Fats and oils: Extra virgin olive oil. Avocado oil. Grape seed oil.  Sweets and desserts: [place] yogurt with honey. Baked apples. Poached pears. Trail mix.  Seasoning and other foods: Basil. Cilantro. Coriander. Cumin. Mint. Parsley. Sage. Rosemary. Tarragon. Garlic. Oregano. Thyme. Pepper. Balsalmic vinegar. Tahini. Hummus. Tomato sauce. Olives. Mushrooms. ? Limit these  Grains: Prepackaged pasta or rice dishes. Prepackaged cereal with added sugar.  Vegetables: Deep fried potatoes (french fries).  Fruits: Fruit canned in syrup.  Meats and other protein foods: Beef. Pork. Lamb. Poultry with skin. Hot dogs. Berniece Salines.  Dairy: Ice cream. Sour cream. Whole milk.  Beverages: Juice. Sugar-sweetened soft drinks. Beer. Liquor and spirits.  Fats and oils: Butter. Canola oil. Vegetable oil. Beef fat (tallow). Lard.  Sweets and desserts: Cookies. Cakes. Pies. Candy.  Seasoning and other foods: Mayonnaise. Premade sauces and marinades. The items listed may not be a complete list. Talk with your dietitian about what dietary choices are right for you. Summary  The Mediterranean diet includes both food and lifestyle choices.  Eat a variety of fresh fruits and vegetables, beans, nuts, seeds, and whole grains.  Limit the amount of red meat and sweets that you eat.  Talk with your health care provider about whether it is safe for you to drink red wine in moderation. This means 1 glass a day for  nonpregnant women and 2 glasses a day for men. A glass of wine equals 5 oz (150 mL). This information is not intended to replace advice given to you by your health care provider. Make sure you discuss any questions you have with your health care provider. Document Revised: 04/30/2016 Document Reviewed: 04/23/2016 Elsevier Patient Education  [place].

## 2019-09-27 NOTE — Progress Notes (Signed)
Virtual Visit via Telephone Note   This visit type was conducted due to national recommendations for restrictions regarding the COVID-19 Pandemic (e.g. social distancing) in an effort to limit this patient's exposure and mitigate transmission in our community.  Due to her co-morbid illnesses, this patient is at least at moderate risk for complications without adequate follow up.  This format is felt to be most appropriate for this patient at this time.  The patient did not have access to video technology/had technical difficulties with video requiring transitioning to audio format only (telephone).  All issues noted in this document were discussed and addressed.  No physical exam could be performed with this format.  Please refer to the patient's chart for her  consent to telehealth for [facility].   Date:  09/27/2019   ID:  Karen Osborne, DOB 02-21-1956, MRN [id number]  Patient Location: Home Provider Location: Home  PCP:  [name], MD  Cardiologist:  Sherren Mocha, MD  Electrophysiologist:  None   Evaluation Performed:  Follow-Up Visit  Chief Complaint: Follow-up of CAD, hypertension and hyperlipidemia  History of Present Illness:    Karen Osborne is a 64 y.o. female with past medical history significant for CAD s/p NSTEMI 01/2012 treated with DES to mid LAD, hypertension, hyperlipidemia, COPD, tobacco abuse and diabetes. She had shoulder blade pain with radiation to left shoulder for non-STEMI in 2013.  Echocardiogram in July 2018 showed normal LV systolic function with moderate diastolic dysfunction. Stress test 12/2017: EF 74, tiny apicoseptal fixed defect unchanged from 2015 (likely attenuation artifact), normal perfusion, low risk.This was done for dyspnea evaluation.   She had chronic dyspnea felt due to lung issues.  She was last seen in the office on 05/30/2019 by Robbie Lis, PA.  She continued to have chronic stable DOE but no chest discomfort.  She is being seen  today for follow-up. She says that feels well.  She works, now working from home and does not leave the house. She denies chest pain/pressure, shortness of breath (except if walks long distances), orthopnea, PND, edema, palpitations, lightheadedness or syncope.   She still smokes, down to 1/2 PPD from 2-2.5 PPD. She has smoked since the age of 39.   She says that she [facility] and is always normal.   The patient does not have symptoms concerning for COVID-19 infection (fever, chills, cough, or new shortness of breath).    Past Medical History:  Diagnosis Date  . BRCA negative 08/2015   MyRisk neg  . COPD (chronic obstructive pulmonary disease) ([place]) 2019  . Coronary artery disease    a. s/p NSTEMI - LHC 01/27/12: prox LAD 20%, mid LAD 99%, prox OM 30%, prox RCA 30%, mid RCA 50%, dist RCA 30%, EF 60%. PCI: 2.25 x 16 mm Promus Element DES to mid LAD //  b. Myoview (7/15): Fixed apical defect (breast attenuation versus scar), no ischemia, EF 64%; low risk   . Depression   . Diabetes mellitus without complication ([place]) 5916  . Family history of ovarian cancer   . Glucose intolerance (impaired glucose tolerance)    A1C 6.1 01/2012  . History of echocardiogram    Echo (01/28/12): Mild LVH, EF 55%, apical septum and true apex severe HK, Gr 2 DD, mild LAE, normal RVF // Echo 7/18: mild LVH, EF 60-65, no RWMA, Gr 2 DD, normal RVF  . History of nuclear stress test    Nuclear stress test 4/19: EF  74, apicoseptal defect c/w atten artifact, normal perfusion; Low Risk  . Hypercholesteremia   . Hypertension   . Myocardial infarction ([place])   . NSVD (normal spontaneous vaginal delivery) 1988  . Osteopenia 2016   hip  . Osteoporosis 2019   in spine and femoral neck; at [facility]  . Restless leg syndrome   . Tobacco abuse    Past Surgical History:  Procedure Laterality Date  . ABDOMINAL HYSTERECTOMY     TAHBSO  . COLONOSCOPY  10/2006   TA, [facility] (Dr Vira Agar)  . COLONOSCOPY  WITH PROPOFOL N/A 08/19/2018   TAs, rpt 3 yrs (Tahiliani, Lennette Bihari, MD)  . CORONARY ANGIOPLASTY WITH STENT PLACEMENT  01/27/2012   DES to LAD  . gyn surgery  02/1998   total hysterectomy, abn pap smear, no cancer  . LEFT HEART CATHETERIZATION WITH CORONARY ANGIOGRAM N/A 01/27/2012   Procedure: LEFT HEART CATHETERIZATION WITH CORONARY ANGIOGRAM;  Surgeon: Burnell Blanks, MD;  Location: [facility] CATH LAB;  Service: Cardiovascular;  Laterality: N/A;  . PERCUTANEOUS CORONARY STENT INTERVENTION (PCI-S)  01/27/2012   Procedure: PERCUTANEOUS CORONARY STENT INTERVENTION (PCI-S);  Surgeon: Burnell Blanks, MD;  Location: [facility] CATH LAB;  Service: Cardiovascular;;     Current Meds  Medication Sig  . Blood Glucose Monitoring Suppl (ONE TOUCH ULTRA 2) w/Device KIT 1 each by Does not apply route daily. Use as directed to check sugar once a day  . carvedilol (COREG) 6.25 MG tablet TAKE 1 TABLET BY MOUTH TWICE DAILY WITH A MEAL  . Cholecalciferol (VITAMIN D) 125 MCG (5000 UT) CAPS Take 1 capsule by mouth daily.  . clopidogrel (PLAVIX) 75 MG tablet TAKE 1 TABLET BY MOUTH EVERY DAY  . Cyanocobalamin (B-12) 1000 MCG SUBL Place 1 tablet under the tongue daily.  Marland Kitchen glucose blood (ONE TOUCH ULTRA TEST) test strip 1 each by Other route daily. Use as instructed to check sugar once a day.  . hydrochlorothiazide (MICROZIDE) 12.5 MG capsule Take 1 capsule (12.5 mg total) by mouth daily.  . Ibuprofen-Diphenhydramine HCl (ADVIL PM) 200-25 MG CAPS Take 1 capsule by mouth at bedtime as needed.  Marland Kitchen lisinopril (ZESTRIL) 20 MG tablet TAKE 1 TABLET BY MOUTH EVERY DAY  . metFORMIN (GLUCOPHAGE-XR) 500 MG 24 hr tablet TAKE 2 TABLETS BY MOUTH EVERY DAY  . nitroGLYCERIN (NITROSTAT) 0.4 MG SL tablet Place 1 tablet (0.4 mg total) under the tongue every 5 (five) minutes as needed for chest pain (up to 3 doses).  Glory Rosebush DELICA LANCETS 82C MISC 1 each by Does not apply route daily. Use as directed to check sugar once a day.  Marland Kitchen  rOPINIRole (REQUIP) 1 MG tablet TAKE 1-2 TABLETS (1-2 MG TOTAL) BY MOUTH AT BEDTIME.  . rosuvastatin (CRESTOR) 40 MG tablet TAKE 1 TABLET BY MOUTH EVERY DAY  . tiotropium (SPIRIVA HANDIHALER) 18 MCG inhalation capsule Place 1 capsule (18 mcg total) into inhaler and inhale daily.     Allergies:   Penicillins   Social History   Tobacco Use  . Smoking status: Current Every Day Smoker    Packs/day: 1.50    Years: 48.40    Pack years: 72.60    Types: Cigarettes  . Smokeless tobacco: Never Used  Substance Use Topics  . Alcohol use: No  . Drug use: No     Family Hx: The patient's family history includes Diabetes in her sister; Heart attack in her father; Heart disease (age of onset: 73) in her father; Hypertension in her father; Kidney cancer  in her father; Ovarian cancer (age of onset: 24) in her maternal aunt; Prostate cancer in her father. There is no history of Breast cancer.  ROS:   Please see the history of present illness.     All other systems reviewed and are negative.   Prior CV studies:   The following studies were reviewed today:  Stress test 12/2017  Nuclear stress EF: 74%.  Defect 1: There is a small defect of mild severity present in the apical septal location.  This is a low risk study.  Low risk stress nuclear study with normal perfusion and normal left ventricular regional and global systolic function. A tiny apicoseptal fixed defect is unchanged from 2015 and is most likely due to attenuation artifact.   Labs/Other Tests and Data Reviewed:    EKG:  An ECG dated 11/15/2018 was personally reviewed today and demonstrated:  Normal sinus rhythm, 73 bpm  Recent Labs: 12/30/2018: ALT 16; BUN 15; Creatinine, Ser 1.08; Potassium 4.4; Sodium 137   Recent Lipid Panel Lab Results  Component Value Date/Time   CHOL 104 12/30/2018 08:25 AM   CHOL 123 06/25/2017 07:34 AM   TRIG 182.0 (H) 12/30/2018 08:25 AM   HDL 28.30 (L) 12/30/2018 08:25 AM   HDL 28 (L)  06/25/2017 07:34 AM   CHOLHDL 4 12/30/2018 08:25 AM   LDLCALC 39 12/30/2018 08:25 AM   LDLCALC 64 06/25/2017 07:34 AM    Wt Readings from Last 3 Encounters:  07/03/19 189 lb (85.7 kg)  05/30/19 189 lb 6.4 oz (85.9 kg)  11/15/18 183 lb 12.8 oz (83.4 kg)     Objective:    Vital Signs:  Ht 5' 7"  (1.702 m)   BMI 29.60 kg/m    VITAL SIGNS:  reviewed GEN:  no acute distress RESPIRATORY:  No coughing or wheezing noted over the phone.  Patient is able to speak in complete sentences.  Noted prolonged exhalation consistent with COPD. NEURO:  alert and oriented x 3, no obvious focal deficit PSYCH:  normal affect  ASSESSMENT & PLAN:    Dyspnea on exertion -Patient reports only mild dyspnea on exertion with walking further distances or carrying packages.  This has been stable with no recent worsening. -Likely her COPD and continued smoking is a strong contributor.  CAD -History of MI and LAD stent in 2013.  No further cardiac episodes. -Medical management with Plavix, statin, beta-blocker and ACE inhibitor. -No anginal symptoms. -She plans on getting the Covid vaccine as soon as it is offered for her age group.  Hypertension -On carvedilol 6.25 mg, hydrochlorothiazide 12.5 mg, lisinopril 20 mg -Patient reports well-controlled blood pressure [facility].  She will try to get a home blood pressure cuff.  Hyperlipidemia, goal LDL<70 -On rosuvastatin 40 mg daily -Lipids followed by primary care.  LDL was 39 by lipid panel in 12/2018.  At goal.  Continue current therapy.  Tobacco abuse -Smokes 1/2 pack/day, down from 2+ packs/day in the past. -Full cessation encouraged.  COPD -Followed by pulmonology.  COVID-19 Education: The signs and symptoms of COVID-19 were discussed with the patient and how to seek care for testing (follow up with PCP or arrange E-visit).  The importance of social distancing was discussed today.  Time:   Today, I have spent 9 minutes with the  patient with telehealth technology discussing the above problems.     Medication Adjustments/Labs and Tests Ordered: Current medicines are reviewed at length with the patient today.  Concerns regarding medicines  are outlined above.   Tests Ordered: No orders of the defined types were placed in this encounter.   Medication Changes: No orders of the defined types were placed in this encounter.   Follow Up:  In Person in 1 year(s)  Signed, Daune Perch, NP  09/27/2019 10:14 AM    [place]

## 2019-11-23 ENCOUNTER — Other Ambulatory Visit: Payer: Self-pay | Admitting: Cardiovascular Disease

## 2019-12-04 ENCOUNTER — Other Ambulatory Visit: Payer: Self-pay | Admitting: Physician Assistant

## 2019-12-04 DIAGNOSIS — I10 Essential (primary) hypertension: Secondary | ICD-10-CM

## 2019-12-04 DIAGNOSIS — E7800 Pure hypercholesterolemia, unspecified: Secondary | ICD-10-CM

## 2019-12-22 ENCOUNTER — Other Ambulatory Visit: Payer: Self-pay | Admitting: Family Medicine

## 2019-12-22 ENCOUNTER — Other Ambulatory Visit: Payer: Self-pay | Admitting: Cardiovascular Disease

## 2019-12-22 NOTE — Telephone Encounter (Signed)
Called and left voicemail for patient to call back and schedule cpe and labs.

## 2019-12-22 NOTE — Telephone Encounter (Signed)
CPE/LABS scheduled

## 2019-12-22 NOTE — Telephone Encounter (Signed)
Plz schedule CPE and lab visits.  Then return encounter to me for refills.

## 2020-01-09 DIAGNOSIS — H524 Presbyopia: Secondary | ICD-10-CM | POA: Diagnosis not present

## 2020-01-09 LAB — HM DIABETES EYE EXAM

## 2020-01-11 ENCOUNTER — Encounter: Payer: BC Managed Care – PPO | Admitting: Family Medicine

## 2020-01-11 ENCOUNTER — Encounter: Payer: Self-pay | Admitting: Family Medicine

## 2020-01-15 ENCOUNTER — Other Ambulatory Visit: Payer: Self-pay | Admitting: Family Medicine

## 2020-01-15 DIAGNOSIS — E7800 Pure hypercholesterolemia, unspecified: Secondary | ICD-10-CM

## 2020-01-15 DIAGNOSIS — E1169 Type 2 diabetes mellitus with other specified complication: Secondary | ICD-10-CM

## 2020-01-15 DIAGNOSIS — E538 Deficiency of other specified B group vitamins: Secondary | ICD-10-CM

## 2020-01-15 DIAGNOSIS — M810 Age-related osteoporosis without current pathological fracture: Secondary | ICD-10-CM

## 2020-01-16 ENCOUNTER — Other Ambulatory Visit: Payer: Self-pay

## 2020-01-16 ENCOUNTER — Other Ambulatory Visit (INDEPENDENT_AMBULATORY_CARE_PROVIDER_SITE_OTHER): Payer: BC Managed Care – PPO

## 2020-01-16 DIAGNOSIS — E538 Deficiency of other specified B group vitamins: Secondary | ICD-10-CM

## 2020-01-16 DIAGNOSIS — E7800 Pure hypercholesterolemia, unspecified: Secondary | ICD-10-CM | POA: Diagnosis not present

## 2020-01-16 DIAGNOSIS — M810 Age-related osteoporosis without current pathological fracture: Secondary | ICD-10-CM | POA: Diagnosis not present

## 2020-01-16 DIAGNOSIS — E1169 Type 2 diabetes mellitus with other specified complication: Secondary | ICD-10-CM

## 2020-01-16 LAB — COMPREHENSIVE METABOLIC PANEL
ALT: 10 U/L (ref 0–35)
AST: 12 U/L (ref 0–37)
Albumin: 4.1 g/dL (ref 3.5–5.2)
Alkaline Phosphatase: 71 U/L (ref 39–117)
BUN: 12 mg/dL (ref 6–23)
CO2: 29 mEq/L (ref 19–32)
Calcium: 9.3 mg/dL (ref 8.4–10.5)
Chloride: 101 mEq/L (ref 96–112)
Creatinine, Ser: 1.05 mg/dL (ref 0.40–1.20)
GFR: 52.91 mL/min — ABNORMAL LOW (ref >60.00)
Glucose, Bld: 127 mg/dL — ABNORMAL HIGH (ref 70–99)
Potassium: 4.2 mEq/L (ref 3.5–5.1)
Sodium: 136 mEq/L (ref 135–145)
Total Bilirubin: 0.5 mg/dL (ref 0.2–1.2)
Total Protein: 6.7 g/dL (ref 6.0–8.3)

## 2020-01-16 LAB — LIPID PANEL
Cholesterol: 99 mg/dL (ref 0–200)
HDL: 29.9 mg/dL — ABNORMAL LOW (ref >39.00)
LDL Cholesterol: 43 mg/dL (ref 0–99)
NonHDL: 69.28
Total CHOL/HDL Ratio: 3
Triglycerides: 131 mg/dL (ref 0.0–149.0)
VLDL: 26.2 mg/dL (ref 0.0–40.0)

## 2020-01-16 LAB — VITAMIN D 25 HYDROXY (VIT D DEFICIENCY, FRACTURES): VITD: 95.4 ng/mL (ref 30.00–100.00)

## 2020-01-16 LAB — HEMOGLOBIN A1C: Hgb A1c MFr Bld: 6.2 % (ref 4.6–6.5)

## 2020-01-16 LAB — VITAMIN B12: Vitamin B-12: 1204 pg/mL — ABNORMAL HIGH (ref 211–911)

## 2020-01-23 ENCOUNTER — Other Ambulatory Visit: Payer: Self-pay

## 2020-01-23 ENCOUNTER — Ambulatory Visit (INDEPENDENT_AMBULATORY_CARE_PROVIDER_SITE_OTHER): Payer: BC Managed Care – PPO | Admitting: Family Medicine

## 2020-01-23 ENCOUNTER — Encounter: Payer: Self-pay | Admitting: Family Medicine

## 2020-01-23 VITALS — BP 128/64 | HR 80 | Temp 98.2°F | Ht 64.25 in | Wt 181.0 lb

## 2020-01-23 DIAGNOSIS — Z23 Encounter for immunization: Secondary | ICD-10-CM

## 2020-01-23 DIAGNOSIS — E538 Deficiency of other specified B group vitamins: Secondary | ICD-10-CM

## 2020-01-23 DIAGNOSIS — G2581 Restless legs syndrome: Secondary | ICD-10-CM | POA: Diagnosis not present

## 2020-01-23 DIAGNOSIS — E7800 Pure hypercholesterolemia, unspecified: Secondary | ICD-10-CM | POA: Diagnosis not present

## 2020-01-23 DIAGNOSIS — F17200 Nicotine dependence, unspecified, uncomplicated: Secondary | ICD-10-CM

## 2020-01-23 DIAGNOSIS — Z0000 Encounter for general adult medical examination without abnormal findings: Secondary | ICD-10-CM | POA: Insufficient documentation

## 2020-01-23 DIAGNOSIS — F418 Other specified anxiety disorders: Secondary | ICD-10-CM

## 2020-01-23 DIAGNOSIS — J449 Chronic obstructive pulmonary disease, unspecified: Secondary | ICD-10-CM

## 2020-01-23 DIAGNOSIS — M810 Age-related osteoporosis without current pathological fracture: Secondary | ICD-10-CM

## 2020-01-23 DIAGNOSIS — I700 Atherosclerosis of aorta: Secondary | ICD-10-CM

## 2020-01-23 DIAGNOSIS — E1169 Type 2 diabetes mellitus with other specified complication: Secondary | ICD-10-CM

## 2020-01-23 DIAGNOSIS — I10 Essential (primary) hypertension: Secondary | ICD-10-CM

## 2020-01-23 DIAGNOSIS — I2510 Atherosclerotic heart disease of native coronary artery without angina pectoris: Secondary | ICD-10-CM

## 2020-01-23 MED ORDER — SERTRALINE HCL 25 MG PO TABS: 25.0000 mg | ORAL_TABLET | Freq: Every day | ORAL | 6 refills | Status: DC

## 2020-01-23 NOTE — Assessment & Plan Note (Signed)
H/o this. No recent DEXA. Will update. Reviewed vit D dosing - suggested MWF 5000 IU MWF.

## 2020-01-23 NOTE — Assessment & Plan Note (Signed)
Chronic. Not interested in counseling (costly). Agrees to start sertraline 25mg  daily. Previously on celexa and wellbutrin without much benefit.

## 2020-01-23 NOTE — Assessment & Plan Note (Signed)
Levels high - suggested change dosing to MWF.

## 2020-01-23 NOTE — Assessment & Plan Note (Signed)
Continue statin. 

## 2020-01-23 NOTE — Assessment & Plan Note (Signed)
Continue plavix, statin.  

## 2020-01-23 NOTE — Assessment & Plan Note (Signed)
Chronic, stable. Continue crestor 40mg  daily. The ASCVD Risk score Mikey Bussing DC Jr., et al., 2013) failed to calculate for the following reasons:   The valid total cholesterol range is 130 to 320 mg/dL

## 2020-01-23 NOTE — Assessment & Plan Note (Signed)
Chronic, stable on requip and plenty of water throughout the day. Will need iron panel next labs.

## 2020-01-23 NOTE — Progress Notes (Addendum)
This visit was conducted in person.  BP 128/64 (BP Location: Left Arm, Patient Position: Sitting, Cuff Size: Normal)   Pulse 80   Temp 98.2 F (36.8 C) (Temporal)   Ht 5' 4.25" (1.632 m)   Wt 181 lb (82.1 kg)   SpO2 97%   BMI 30.83 kg/m    CC: CPE Subjective:    Patient ID: Karen Osborne, female    DOB: 10/11/1955, 64 y.o.   MRN: [id number]  HPI: Karen Osborne is a 64 y.o. female presenting on 01/23/2020 for Annual Exam   DM - tolerating metformin XR 554m bid - sugars running well controlled: 110-120 fasting.   RLS - takes requip and drinks water with benefit   Struggles with depression "all my life" worse since MI 2013. Son with h/o addiction, now clean for 37yr but pt remains anxious about recurrence. She takes 2 advil PM to help sleep. Previous wellbutrin and celexa ineffective.  Preventative: COLONOSCOPY WITH PROPOFOL 08/19/2018 - TAs, rpt 3 yrs ([place], [place]) Breast cancer screening - mammo 06/2018 - rec additional views - she doesn't remember if she went back for this - will call [facility] to inquire Well woman exam with [facility] - s/p TAH w/ BSO - sees AlFraser DinLung cancer screening - undergoing, latest 06/2019, started 06/2018 DEXA scan - may have had 2007, will request records  Flu shot - yearly Tetanus shot - 2002, Tdap due but deferred COVID vaccine - completed Pf[place]inturn/2021  Pneumovax 2013 Shingrix - discussed, interested - will do first shingles shot today, return for second.   Seat belt use discussed Sunscreen use discussed. No changing moles on skin. Has seen derm.  Current smoker 1/2 ppd  Alcohol - none Dentist - q6 mo - discussing dentures Eye exam - yearly  Lives with husband and son Occ: sales (pipe fitting and valves) Activity: some walking Diet: good water, fruits/vegetables daily     Relevant past medical, surgical, family and social history reviewed and updated as indicated. Interim medical history since our last  visit reviewed. Allergies and medications reviewed and updated. Outpatient Medications Prior to Visit  Medication Sig Dispense Refill  . Blood Glucose Monitoring Suppl (ONE TOUCH ULTRA 2) w/Device KIT 1 each by Does not apply route daily. Use as directed to check sugar once a day 1 each 0  . carvedilol (COREG) 6.25 MG tablet TAKE 1 TABLET BY MOUTH TWICE DAILY WITH A MEAL 180 tablet 2  . Cholecalciferol (VITAMIN D) 125 MCG (5000 UT) CAPS Take 1 capsule by mouth daily.    . clopidogrel (PLAVIX) 75 MG tablet TAKE 1 TABLET BY MOUTH EVERY DAY 90 tablet 3  . Cyanocobalamin (B-12) 1000 MCG SUBL Place 1 tablet under the tongue daily. 30 each   . glucose blood (ONE TOUCH ULTRA TEST) test strip 1 each by Other route daily. Use as instructed to check sugar once a day. 100 each 0  . hydrochlorothiazide (MICROZIDE) 12.5 MG capsule TAKE 1 CAPSULE BY MOUTH EVERY DAY 90 capsule 2  . Ibuprofen-Diphenhydramine HCl (ADVIL PM) 200-25 MG CAPS Take 1 capsule by mouth at bedtime as needed.    . Marland Kitchenisinopril (ZESTRIL) 20 MG tablet TAKE 1 TABLET BY MOUTH EVERY DAY 90 tablet 3  . metFORMIN (GLUCOPHAGE-XR) 500 MG 24 hr tablet TAKE 2 TABLETS BY MOUTH EVERY DAY 180 tablet 1  . nitroGLYCERIN (NITROSTAT) 0.4 MG SL tablet Place 1 tablet (0.4 mg total) under the tongue every 5 (five)  minutes as needed for chest pain (up to 3 doses). 25 tablet 3  . ONETOUCH DELICA LANCETS 71G MISC 1 each by Does not apply route daily. Use as directed to check sugar once a day. 100 each 0  . rOPINIRole (REQUIP) 1 MG tablet TAKE 1-2 TABLETS (1-2 MG TOTAL) BY MOUTH AT BEDTIME. 180 tablet 0  . rosuvastatin (CRESTOR) 40 MG tablet TAKE 1 TABLET BY MOUTH EVERY DAY 90 tablet 3  . tiotropium (SPIRIVA HANDIHALER) 18 MCG inhalation capsule Place 1 capsule (18 mcg total) into inhaler and inhale daily. 30 capsule 2   No facility-administered medications prior to visit.     Per HPI unless specifically indicated in ROS section below Review of Systems   Constitutional: Negative for activity change, appetite change, chills, fatigue, fever and unexpected weight change.  HENT: Negative for hearing loss.   Eyes: Negative for visual disturbance.  Respiratory: Positive for cough. Negative for chest tightness, shortness of breath and wheezing.   Cardiovascular: Negative for chest pain, palpitations and leg swelling.  Gastrointestinal: Negative for abdominal distention, abdominal pain, blood in stool, constipation (managed with dulcolax), diarrhea, nausea and vomiting.  Genitourinary: Negative for difficulty urinating and hematuria.  Musculoskeletal: Negative for arthralgias, myalgias and neck pain.       RLS   Skin: Negative for rash.  Neurological: Negative for dizziness, seizures, syncope and headaches.  Hematological: Negative for adenopathy. Does not bruise/bleed easily.  Psychiatric/Behavioral: Positive for dysphoric mood. The patient is nervous/anxious.    Objective:  BP 128/64 (BP Location: Left Arm, Patient Position: Sitting, Cuff Size: Normal)   Pulse 80   Temp 98.2 F (36.8 C) (Temporal)   Ht 5' 4.25" (1.632 m)   Wt 181 lb (82.1 kg)   SpO2 97%   BMI 30.83 kg/m   Wt Readings from Last 3 Encounters:  01/23/20 181 lb (82.1 kg)  07/03/19 189 lb (85.7 kg)  05/30/19 189 lb 6.4 oz (85.9 kg)      Physical Exam Vitals and nursing note reviewed.  Constitutional:      General: She is not in acute distress.    Appearance: Normal appearance. She is well-developed. She is not ill-appearing.  HENT:     Head: Normocephalic and atraumatic.     Right Ear: Hearing, tympanic membrane, ear canal and external ear normal.     Left Ear: Hearing, tympanic membrane, ear canal and external ear normal.  Eyes:     General: No scleral icterus.    Extraocular Movements: Extraocular movements intact.     Conjunctiva/sclera: Conjunctivae normal.     Pupils: Pupils are equal, round, and reactive to light.  Neck:     Thyroid: No thyromegaly or  thyroid tenderness.  Cardiovascular:     Rate and Rhythm: Normal rate and regular rhythm.     Pulses: Normal pulses.          Radial pulses are 2+ on the right side and 2+ on the left side.     Heart sounds: Normal heart sounds. No murmur.  Pulmonary:     Effort: Pulmonary effort is normal. No respiratory distress.     Breath sounds: Normal breath sounds. No wheezing, rhonchi or rales.  Abdominal:     General: Abdomen is flat. Bowel sounds are normal. There is no distension.     Palpations: Abdomen is soft. There is no mass.     Tenderness: There is no abdominal tenderness. There is no guarding or rebound.     Hernia: No  hernia is present.  Musculoskeletal:        General: Normal range of motion.     Cervical back: Normal range of motion and neck supple.     Right lower leg: No edema.     Left lower leg: No edema.  Lymphadenopathy:     Cervical: No cervical adenopathy.  Skin:    General: Skin is warm and dry.     Findings: No rash.  Neurological:     General: No focal deficit present.     Mental Status: She is alert and oriented to person, place, and time.     Comments: CN grossly intact, station and gait intact  Psychiatric:        Mood and Affect: Mood normal.        Behavior: Behavior normal.        Thought Content: Thought content normal.        Judgment: Judgment normal.       Results for orders placed or performed in visit on 01/16/20  VITAMIN D 25 Hydroxy (Vit-D Deficiency, Fractures)  Result Value Ref Range   VITD 95.40 30.00 - 100.00 ng/mL  Vitamin B12  Result Value Ref Range   Vitamin B-12 1,204 (H) 211 - 911 pg/mL  Hemoglobin A1c  Result Value Ref Range   Hgb A1c MFr Bld 6.2 4.6 - 6.5 %  Comprehensive metabolic panel  Result Value Ref Range   Sodium 136 135 - 145 mEq/L   Potassium 4.2 3.5 - 5.1 mEq/L   Chloride 101 96 - 112 mEq/L   CO2 29 19 - 32 mEq/L   Glucose, Bld 127 (H) 70 - 99 mg/dL   BUN 12 6 - 23 mg/dL   Creatinine, Ser 1.05 0.40 - 1.20 mg/dL    Total Bilirubin 0.5 0.2 - 1.2 mg/dL   Alkaline Phosphatase 71 39 - 117 U/L   AST 12 0 - 37 U/L   ALT 10 0 - 35 U/L   Total Protein 6.7 6.0 - 8.3 g/dL   Albumin 4.1 3.5 - 5.2 g/dL   GFR 52.91 (L) >60.00 mL/min   Calcium 9.3 8.4 - 10.5 mg/dL  Lipid panel  Result Value Ref Range   Cholesterol 99 0 - 200 mg/dL   Triglycerides 131.0 0.0 - 149.0 mg/dL   HDL 29.90 (L) >39.00 mg/dL   VLDL 26.2 0.0 - 40.0 mg/dL   LDL Cholesterol 43 0 - 99 mg/dL   Total CHOL/HDL Ratio 3    NonHDL 69.28    Depression screen [facility] 2/9 01/23/2020 02/04/2018  Decreased Interest 2 0  Down, Depressed, Hopeless 1 0  PHQ - 2 Score 3 0  Altered sleeping 2 -  Tired, decreased energy 2 -  Change in appetite 1 -  Feeling bad or failure about yourself  0 -  Trouble concentrating 2 -  Moving slowly or fidgety/restless 0 -  Suicidal thoughts 0 -  PHQ-9 Score 10 -    GAD 7 : Generalized Anxiety Score 01/23/2020  Nervous, Anxious, on Edge 1  Control/stop worrying 2  Worry too much - different things 2  Trouble relaxing 2  Restless 1  Easily annoyed or irritable 2  Afraid - awful might happen 2  Total GAD 7 Score 12   Assessment & Plan:  This visit occurred during the SARS-CoV-2 public health emergency.  Safety protocols were in place, including screening questions prior to the visit, additional usage of staff PPE, and extensive cleaning of exam room while observing  appropriate contact time as indicated for disinfecting solutions.   Problem List Items Addressed This Visit    Vitamin B12 deficiency    Levels high - suggested change dosing to MWF.      Type 2 diabetes mellitus with other specified complication (HCC)    Chronic, great control only on metformin XR 535m BID. Continue this.       TOBACCO ABUSE    Continue to encourage cessation - contemplative. Down to 1/2 ppd. Continue lung cancer screening CT program.       Thoracic aorta atherosclerosis ([place]    Continue statin.       RESTLESS LEG  SYNDROME    Chronic, stable on requip and plenty of water throughout the day. Will need iron panel next labs.       Osteoporosis    H/o this. No recent DEXA. Will update. Reviewed vit D dosing - suggested MWF 5000 IU MWF.      Relevant Orders   DG Bone Density   HYPERCHOLESTEROLEMIA    Chronic, stable. Continue crestor 429mdaily. The ASCVD Risk score (GMikey BussingC Jr., et al., 2013) failed to calculate for the following reasons:   The valid total cholesterol range is 130 to 320 mg/dL       Health maintenance examination - Primary    Preventative protocols reviewed and updated unless pt declined. Discussed healthy diet and lifestyle.       Essential hypertension    Chronic, stable. Continue current regimen.       Depression with anxiety    Chronic. Not interested in counseling (costly). Agrees to start sertraline 2527maily. Previously on celexa and wellbutrin without much benefit.       Relevant Medications   sertraline (ZOLOFT) 25 MG tablet   COPD (chronic obstructive pulmonary disease) (HCC)    Continue spiriva.       CAD (coronary artery disease)    Continue plavix, statin.        Other Visit Diagnoses    Need for shingles vaccine       Relevant Orders   Varicella-zoster vaccine IM (Completed)       Meds ordered this encounter  Medications  . sertraline (ZOLOFT) 25 MG tablet    Sig: Take 1 tablet (25 mg total) by mouth daily.    Dispense:  30 tablet    Refill:  6   Orders Placed This Encounter  Procedures  . DG Bone Density    Standing Status:   Future    Standing Expiration Date:   03/24/2021    Order Specific Question:   Reason for Exam (SYMPTOM  OR DIAGNOSIS REQUIRED)    Answer:   osteoporosis f/u    Order Specific Question:   Preferred imaging location?    Answer:   External  . Varicella-zoster vaccine IM    Patient instructions: Call to schedule mammogram and follow up on abnormal one 2019[place] [facility] [id number] [facility]  Change vitamins to MWF dosing.  Trial sertraline 42m80mily for mood.  Return as needed or in 1 year for next physical.   Follow up plan: Return in about 1 year (around 01/22/2021) for annual exam, prior fasting for blood work.  JaviRia Bush

## 2020-01-23 NOTE — Assessment & Plan Note (Signed)
Chronic, stable. Continue current regimen. 

## 2020-01-23 NOTE — Addendum Note (Signed)
Addended by: Brenton Grills on: [id number] AM   Modules accepted: Orders

## 2020-01-23 NOTE — Assessment & Plan Note (Signed)
Preventative protocols reviewed and updated unless pt declined. Discussed healthy diet and lifestyle.  

## 2020-01-23 NOTE — Patient Instructions (Addendum)
Call to schedule mammogram and follow up on abnormal one 2019: [facility] in [place] [phone]   We will request bone density scan from [facility] imaging.  Change vitamins to MWF dosing.  Trial sertraline 25mg  daily for mood.  Return as needed or in 1 year for next physical.   Health Maintenance for Postmenopausal Women Menopause is a normal process in which your ability to get pregnant comes to an end. This process happens slowly over many months or years, usually between the ages of 90 and 40. Menopause is complete when you have missed your menstrual periods for 12 months. It is important to talk with your health care provider about some of the most common conditions that affect women after menopause (postmenopausal women). These include heart disease, cancer, and bone loss (osteoporosis). Adopting a healthy lifestyle and getting preventive care can help to promote your health and wellness. The actions you take can also lower your chances of developing some of these common conditions. What should I know about menopause? During menopause, you may get a number of symptoms, such as:  Hot flashes. These can be moderate or severe.  Night sweats.  Decrease in sex drive.  Mood swings.  Headaches.  Tiredness.  Irritability.  Memory problems.  Insomnia. Choosing to treat or not to treat these symptoms is a decision that you make with your health care provider. Do I need hormone replacement therapy?  Hormone replacement therapy is effective in treating symptoms that are caused by menopause, such as hot flashes and night sweats.  Hormone replacement carries certain risks, especially as you become older. If you are thinking about using estrogen or estrogen with progestin, discuss the benefits and risks with your health care provider. What is my risk for heart disease and stroke? The risk of heart disease, heart attack, and stroke increases as you age. One of the causes may be a change  in the body's hormones during menopause. This can affect how your body uses dietary fats, triglycerides, and cholesterol. Heart attack and stroke are medical emergencies. There are many things that you can do to help prevent heart disease and stroke. Watch your blood pressure  High blood pressure causes heart disease and increases the risk of stroke. This is more likely to develop in people who have high blood pressure readings, are of African descent, or are overweight.  Have your blood pressure checked: ? Every 3-5 years if you are 61-32 years of age. ? Every year if you are 52 years old or older. Eat a healthy diet   Eat a diet that includes plenty of vegetables, fruits, low-fat dairy products, and lean protein.  Do not eat a lot of foods that are high in solid fats, added sugars, or sodium. Get regular exercise Get regular exercise. This is one of the most important things you can do for your health. Most adults should:  Try to exercise for at least 150 minutes each week. The exercise should increase your heart rate and make you sweat (moderate-intensity exercise).  Try to do strengthening exercises at least twice each week. Do these in addition to the moderate-intensity exercise.  Spend less time sitting. Even light physical activity can be beneficial. Other tips  Work with your health care provider to achieve or maintain a healthy weight.  Do not use any products that contain nicotine or tobacco, such as cigarettes, e-cigarettes, and chewing tobacco. If you need help quitting, ask your health care provider.  Know your numbers. Ask  your health care provider to check your cholesterol and your blood sugar (glucose). Continue to have your blood tested as directed by your health care provider. Do I need screening for cancer? Depending on your health history and family history, you may need to have cancer screening at different stages of your life. This may include screening  for:  Breast cancer.  Cervical cancer.  Lung cancer.  Colorectal cancer. What is my risk for osteoporosis? After menopause, you may be at increased risk for osteoporosis. Osteoporosis is a condition in which bone destruction happens more quickly than new bone creation. To help prevent osteoporosis or the bone fractures that can happen because of osteoporosis, you may take the following actions:  If you are 60-21 years old, get at least 1,000 mg of calcium and at least 600 mg of vitamin D per day.  If you are older than age 48 but younger than age 2, get at least 1,200 mg of calcium and at least 600 mg of vitamin D per day.  If you are older than age 68, get at least 1,200 mg of calcium and at least 800 mg of vitamin D per day. Smoking and drinking excessive alcohol increase the risk of osteoporosis. Eat foods that are rich in calcium and vitamin D, and do weight-bearing exercises several times each week as directed by your health care provider. How does menopause affect my mental health? Depression may occur at any age, but it is more common as you become older. Common symptoms of depression include:  Low or sad mood.  Changes in sleep patterns.  Changes in appetite or eating patterns.  Feeling an overall lack of motivation or enjoyment of activities that you previously enjoyed.  Frequent crying spells. Talk with your health care provider if you think that you are experiencing depression. General instructions See your health care provider for regular wellness exams and vaccines. This may include:  Scheduling regular health, dental, and eye exams.  Getting and maintaining your vaccines. These include: ? Influenza vaccine. Get this vaccine each year before the flu season begins. ? Pneumonia vaccine. ? Shingles vaccine. ? Tetanus, diphtheria, and pertussis (Tdap) booster vaccine. Your health care provider may also recommend other immunizations. Tell your health care provider  if you have ever been abused or do not feel safe at home. Summary  Menopause is a normal process in which your ability to get pregnant comes to an end.  This condition causes hot flashes, night sweats, decreased interest in sex, mood swings, headaches, or lack of sleep.  Treatment for this condition may include hormone replacement therapy.  Take actions to keep yourself healthy, including exercising regularly, eating a healthy diet, watching your weight, and checking your blood pressure and blood sugar levels.  Get screened for cancer and depression. Make sure that you are up to date with all your vaccines. This information is not intended to replace advice given to you by your health care provider. Make sure you discuss any questions you have with your health care provider. Document Revised: 08/24/2018 Document Reviewed: 08/24/2018 Elsevier Patient Education  2020 [employer].

## 2020-01-23 NOTE — Assessment & Plan Note (Signed)
Chronic, great control only on metformin XR 500mg  BID. Continue this.

## 2020-01-23 NOTE — Assessment & Plan Note (Signed)
Continue spiriva.

## 2020-01-23 NOTE — Assessment & Plan Note (Signed)
Continue to encourage cessation - contemplative. Down to 1/2 ppd. Continue lung cancer screening CT program.

## 2020-02-26 ENCOUNTER — Other Ambulatory Visit: Payer: Self-pay | Admitting: Family Medicine

## 2020-03-02 ENCOUNTER — Other Ambulatory Visit: Payer: Self-pay | Admitting: Cardiovascular Disease

## 2020-03-02 ENCOUNTER — Other Ambulatory Visit: Payer: Self-pay | Admitting: Family Medicine

## 2020-03-02 DIAGNOSIS — I10 Essential (primary) hypertension: Secondary | ICD-10-CM

## 2020-03-02 DIAGNOSIS — I2510 Atherosclerotic heart disease of native coronary artery without angina pectoris: Secondary | ICD-10-CM

## 2020-03-27 ENCOUNTER — Other Ambulatory Visit: Payer: Self-pay | Admitting: Family Medicine

## 2020-03-27 NOTE — Telephone Encounter (Signed)
Requip Last filled:  03/01/20, #180 Last OV:  01/23/20, cpe Next OV:  01/24/21, cpe

## 2020-04-22 NOTE — Progress Notes (Addendum)
PCP: [name], MD   Chief Complaint  Patient presents with  . Annual Exam    HPI:      Ms. Karen Osborne is a 64 y.o. No obstetric history on file. who LMP was No LMP recorded. Patient has had a hysterectomy., presents today for her annual examination.  Her menses are absent due to TAH/BSO. She does not have intermenstrual bleeding. She does not have vasomotor sx.   Sex activity: single partner, contraception - post menopausal status. She does not have vaginal dryness.  Last Pap: 07/05/18  Results were: no abnormalities /neg HPV DNA.  Hx of STDs: none  Last mammogram: 08/08/18 at [facility]; Results were: normal after addl views RT breast--routine follow-up in 12 months There is no FH of breast cancer. There is a FH of ovarian cancer in her mat aunt, and prostate cancer and kidney cancer in her dad. She is MyRisk neg 2016. The patient does do self-breast exams.  Colonoscopy: colonoscopy 2019 with abnormalities.  Repeat due after 3 years.   Tobacco use: The patient currently smokes 1/2 packs of cigarettes per day, down from 1 ppd,  for the past many years. Alcohol use: none  No drug use Exercise: min active  DEXA 2016 at [facility] with osteoporosis in spine, severe osteopenia in hip. Pt is long term smoker. Pt did fosamax in past but stopped by PCP. Due for repeat DEXA--ordered by PCP.  She does get adequate calcium and Vitamin D in her diet.  Labs with PCP.   Past Medical History:  Diagnosis Date  . BRCA negative 08/2015   MyRisk neg  . COPD (chronic obstructive pulmonary disease) ([place]) 2019  . Coronary artery disease    a. s/p NSTEMI - LHC 01/27/12: prox LAD 20%, mid LAD 99%, prox OM 30%, prox RCA 30%, mid RCA 50%, dist RCA 30%, EF 60%. PCI: 2.25 x 16 mm Promus Element DES to mid LAD //  b. Myoview (7/15): Fixed apical defect (breast attenuation versus scar), no ischemia, EF 64%; low risk   . Depression   . Diabetes mellitus without complication ([place]) 0383  . Family  history of ovarian cancer   . Glucose intolerance (impaired glucose tolerance)    A1C 6.1 01/2012  . History of echocardiogram    Echo (01/28/12): Mild LVH, EF 55%, apical septum and true apex severe HK, Gr 2 DD, mild LAE, normal RVF // Echo 7/18: mild LVH, EF 60-65, no RWMA, Gr 2 DD, normal RVF  . History of nuclear stress test    Nuclear stress test 4/19: EF 74, apicoseptal defect c/w atten artifact, normal perfusion; Low Risk  . Hypercholesteremia   . Hypertension   . Myocardial infarction ([place])   . NSVD (normal spontaneous vaginal delivery) 1988  . Osteopenia 2016   hip  . Osteoporosis 2019   in spine and femoral neck; at [facility]  . Restless leg syndrome   . Tobacco abuse     Past Surgical History:  Procedure Laterality Date  . ABDOMINAL HYSTERECTOMY     TAHBSO  . COLONOSCOPY  10/2006   TA, [facility] (Dr Vira Agar)  . COLONOSCOPY WITH PROPOFOL N/A 08/19/2018   TAs, rpt 3 yrs (Tahiliani, Lennette Bihari, MD)  . CORONARY ANGIOPLASTY WITH STENT PLACEMENT  01/27/2012   DES to LAD  . gyn surgery  02/1998   total hysterectomy, abn pap smear, no cancer  . LEFT HEART CATHETERIZATION WITH CORONARY ANGIOGRAM N/A 01/27/2012   Procedure: LEFT HEART CATHETERIZATION WITH CORONARY ANGIOGRAM;  Surgeon: Christopher D McAlhany, MD;  Location: [facility] CATH LAB;  Service: Cardiovascular;  Laterality: N/A;  . PERCUTANEOUS CORONARY STENT INTERVENTION (PCI-S)  01/27/2012   Procedure: PERCUTANEOUS CORONARY STENT INTERVENTION (PCI-S);  Surgeon: Christopher D McAlhany, MD;  Location: [facility] CATH LAB;  Service: Cardiovascular;;    Family History  Problem Relation Age of Onset  . Prostate cancer Father   . Heart disease Father 60  . Hypertension Father   . Heart attack Father   . Kidney cancer Father   . Diabetes Sister   . Ovarian cancer Maternal Aunt 70  . Breast cancer Neg Hx     Social History   Socioeconomic History  . Marital status: Married    Spouse name: Not on file  . Number of children: 1  . Years of  education: Not on file  . Highest education level: Not on file  Occupational History  . 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 Smoking status: Current Every Day Smoker    Packs/day: 1.50    Years: 48.40    Pack years: 72.60    Types: Cigarettes  . Smokeless tobacco: Never Used  Vaping Use  . Vaping Use: Never used  Substance and Sexual Activity  . Alcohol use: No  . Drug use: No  . Sexual activity: Yes    Birth control/protection: Surgical    Comment: Hysterectomy  Other Topics Concern  . Not on file  Social History Narrative   Lives with husband and son   Occ: sales (pipe fitting and valves)   Activity: some walking   Diet: good water, fruits/vegetables daily   Social Determinants of Health   Financial Resource Strain:   . Difficulty of Paying Living Expenses:   Food Insecurity:   . Worried About Running Out of Food in the Last Year:   . Ran Out of Food in the Last Year:   Transportation Needs:   . Lack of Transportation (Medical):   . Lack of Transportation (Non-Medical):   Physical Activity:   . Days of Exercise per Week:   . Minutes of Exercise per Session:   Stress:   . Feeling of Stress :   Social Connections:   . Frequency of Communication with Friends and Family:   . Frequency of Social Gatherings with Friends and Family:   . Attends Religious Services:   . Active Member of Clubs or Organizations:   . Attends Club or Organization Meetings:   . Marital Status:   Intimate Partner Violence:   . Fear of Current or Ex-Partner:   . Emotionally Abused:   . Physically Abused:   . Sexually Abused:     Outpatient Medications Prior to Visit  Medication Sig Dispense Refill  . Blood Glucose Monitoring Suppl (ONE TOUCH ULTRA 2) w/Device KIT 1 each by Does not apply route daily. Use as directed to check sugar once a day 1 each 0  . carvedilol (COREG) 6.25 MG tablet TAKE 1 TABLET BY MOUTH TWICE DAILY WITH A MEAL 180  tablet 1  . Cholecalciferol (VITAMIN D) 125 MCG (5000 UT) CAPS Take 1 capsule by mouth daily.    . clopidogrel (PLAVIX) 75 MG tablet TAKE 1 TABLET BY MOUTH EVERY DAY 90 tablet 3  . Cyanocobalamin (B-12) 1000 MCG SUBL Place 1 tablet under the tongue daily. 30 each   . glucose blood (ONE TOUCH ULTRA TEST) test strip 1 each by Other route daily. Use as instructed to   check sugar once a day. 100 each 0  . hydrochlorothiazide (MICROZIDE) 12.5 MG capsule TAKE 1 CAPSULE BY MOUTH EVERY DAY 90 capsule 2  . Ibuprofen-Diphenhydramine HCl (ADVIL PM) 200-25 MG CAPS Take 1 capsule by mouth at bedtime as needed.    . lisinopril (ZESTRIL) 20 MG tablet TAKE 1 TABLET BY MOUTH EVERY DAY 90 tablet 3  . metFORMIN (GLUCOPHAGE-XR) 500 MG 24 hr tablet TAKE 2 TABLETS BY MOUTH EVERY DAY 180 tablet 2  . nitroGLYCERIN (NITROSTAT) 0.4 MG SL tablet Place 1 tablet (0.4 mg total) under the tongue every 5 (five) minutes as needed for chest pain (up to 3 doses). 25 tablet 3  . ONETOUCH DELICA LANCETS 33G MISC 1 each by Does not apply route daily. Use as directed to check sugar once a day. 100 each 0  . rOPINIRole (REQUIP) 1 MG tablet TAKE 1-2 TABLETS (1-2 MG TOTAL) BY MOUTH AT BEDTIME. 180 tablet 1  . rosuvastatin (CRESTOR) 40 MG tablet TAKE 1 TABLET BY MOUTH EVERY DAY 90 tablet 3  . sertraline (ZOLOFT) 25 MG tablet TAKE 1 TABLET BY MOUTH EVERY DAY 90 tablet 3  . tiotropium (SPIRIVA HANDIHALER) 18 MCG inhalation capsule Place 1 capsule (18 mcg total) into inhaler and inhale daily. 30 capsule 2   No facility-administered medications prior to visit.     ROS:  Review of Systems  Constitutional: Negative for fatigue, fever and unexpected weight change.  Respiratory: Positive for cough and shortness of breath. Negative for wheezing.   Cardiovascular: Negative for chest pain, palpitations and leg swelling.  Gastrointestinal: Negative for blood in stool, constipation, diarrhea, nausea and vomiting.  Endocrine: Negative for cold  intolerance, heat intolerance and polyuria.  Genitourinary: Negative for dyspareunia, dysuria, flank pain, frequency, genital sores, hematuria, menstrual problem, pelvic pain, urgency, vaginal bleeding, vaginal discharge and vaginal pain.  Musculoskeletal: Negative for back pain, joint swelling and myalgias.  Skin: Negative for rash.  Neurological: Negative for dizziness, syncope, light-headedness, numbness and headaches.  Hematological: Negative for adenopathy.  Psychiatric/Behavioral: Negative for agitation, confusion, dysphoric mood, sleep disturbance and suicidal ideas. The patient is not nervous/anxious.   BREAST: No symptoms   Objective: BP 118/74   Ht 5' 6" (1.676 m)   Wt 183 lb (83 kg)   BMI 29.54 kg/m    Physical Exam Constitutional:      Appearance: She is well-developed.  Genitourinary:     Vulva and vagina normal.     No vaginal discharge, erythema or tenderness.     Cervix is absent.     Uterus is absent.     No right or left adnexal mass present.     Right adnexa absent.     Right adnexa not tender.     Left adnexa absent.     Left adnexa not tender.     Genitourinary Comments: UTERUS/CX SURG REM  Neck:     Thyroid: No thyromegaly.  Cardiovascular:     Rate and Rhythm: Normal rate and regular rhythm.     Heart sounds: Normal heart sounds. No murmur heard.   Pulmonary:     Effort: Pulmonary effort is normal.     Breath sounds: Normal breath sounds.  Chest:     Breasts:        Right: No mass, nipple discharge, skin change or tenderness.        Left: No mass, nipple discharge, skin change or tenderness.  Abdominal:     Palpations: Abdomen is soft.       Tenderness: There is no abdominal tenderness. There is no guarding.  Musculoskeletal:        General: Normal range of motion.     Cervical back: Normal range of motion.  Neurological:     General: No focal deficit present.     Mental Status: She is alert and oriented to person, place, and time.      Cranial Nerves: No cranial nerve deficit.  Skin:    General: Skin is warm and dry.  Psychiatric:        Mood and Affect: Mood normal.        Behavior: Behavior normal.        Thought Content: Thought content normal.        Judgment: Judgment normal.  Vitals reviewed.     Assessment/Plan:  Encounter for annual routine gynecological examination  Encounter for screening mammogram for malignant neoplasm of breast - Plan: MM 3D SCREEN BREAST BILATERAL; pt to sched mammo          GYN counsel breast self exam, mammography screening, menopause, adequate intake of calcium and vitamin D, diet and exercise    F/U  Return in about 1 year (around 04/23/2021).  Romir Klimowicz B. Bralynn Donado, PA-C 04/23/2020 8:55 AM

## 2020-04-23 ENCOUNTER — Other Ambulatory Visit: Payer: Self-pay

## 2020-04-23 ENCOUNTER — Ambulatory Visit (INDEPENDENT_AMBULATORY_CARE_PROVIDER_SITE_OTHER): Payer: BC Managed Care – PPO | Admitting: Obstetrics and Gynecology

## 2020-04-23 ENCOUNTER — Encounter: Payer: Self-pay | Admitting: Obstetrics and Gynecology

## 2020-04-23 VITALS — BP 118/74 | Ht 66.0 in | Wt 183.0 lb

## 2020-04-23 DIAGNOSIS — Z01419 Encounter for gynecological examination (general) (routine) without abnormal findings: Secondary | ICD-10-CM | POA: Diagnosis not present

## 2020-04-23 DIAGNOSIS — Z1231 Encounter for screening mammogram for malignant neoplasm of breast: Secondary | ICD-10-CM | POA: Diagnosis not present

## 2020-04-23 NOTE — Patient Instructions (Signed)
I value your feedback and entrusting us with your care. If you get a [facility] patient survey, I would appreciate you taking the time to let us know about your experience today. Thank you!  As of August 24, 2019, your lab results will be released to your MyChart immediately, before I even have a chance to see them. Please give me time to review them and contact you if there are any abnormalities. Thank you for your patience.  

## 2020-05-02 ENCOUNTER — Ambulatory Visit: Payer: BC Managed Care – PPO

## 2020-06-25 ENCOUNTER — Other Ambulatory Visit: Payer: Self-pay | Admitting: *Deleted

## 2020-06-25 DIAGNOSIS — Z122 Encounter for screening for malignant neoplasm of respiratory organs: Secondary | ICD-10-CM

## 2020-06-25 DIAGNOSIS — Z87891 Personal history of nicotine dependence: Secondary | ICD-10-CM

## 2020-06-25 NOTE — Progress Notes (Signed)
Current smoker, 73.5 pack year 

## 2020-07-04 ENCOUNTER — Other Ambulatory Visit: Payer: Self-pay

## 2020-07-04 ENCOUNTER — Ambulatory Visit
Admission: RE | Admit: 2020-07-04 | Discharge: 2020-07-04 | Disposition: A | Discharge status: Home / Self Care | Payer: BC Managed Care – PPO | Source: Ambulatory Visit | Attending: Nurse Practitioner | Admitting: Nurse Practitioner

## 2020-07-04 DIAGNOSIS — Z87891 Personal history of nicotine dependence: Secondary | ICD-10-CM

## 2020-07-04 DIAGNOSIS — F17210 Nicotine dependence, cigarettes, uncomplicated: Secondary | ICD-10-CM | POA: Diagnosis not present

## 2020-07-04 DIAGNOSIS — Z122 Encounter for screening for malignant neoplasm of respiratory organs: Secondary | ICD-10-CM | POA: Diagnosis not present

## 2020-07-11 ENCOUNTER — Encounter: Payer: Self-pay | Admitting: *Deleted

## 2020-07-12 DIAGNOSIS — Z1231 Encounter for screening mammogram for malignant neoplasm of breast: Secondary | ICD-10-CM | POA: Diagnosis not present

## 2020-07-15 ENCOUNTER — Encounter: Payer: Self-pay | Admitting: Obstetrics and Gynecology

## 2020-08-28 ENCOUNTER — Other Ambulatory Visit: Payer: Self-pay | Admitting: Cardiovascular Disease

## 2020-09-20 NOTE — Progress Notes (Signed)
Cardiology Office Note:    Date:  09/24/2020   ID:  Karen Osborne, DOB 1955/10/05, MRN [id number]  PCP:  [name], MD  [facility] Cardiologist:  Sherren Mocha, MD   [facility] Electrophysiologist:  None   Referring MD: [name], MD   Chief Complaint:  Follow-up (CAD)    Patient Profile:    Karen Osborne is a 65 y.o. female with:   Coronary artery disease  S/p non-STEMI 5/13>>PCI: DES to the mid LAD  Echo 7/18: EF 60-65, GRII DD  Myoview 4/19: Low risk  Hypertension  Hyperlipidemia  Tobacco use  Diabetes mellitus  Aortic atherosclerosis  COPD  Prior CV studies: Nuclear stress test 01/07/2018 EF 74, tiny apicoseptal fixed defect unchanged from 2015 (likely attenuation artifact), normal perfusion, low risk  Echo 03/16/2017 Mild LVH, EF 03-83, grade 2 diastolic dysfunction, normal wall motion  Myoview (7/15): Fixed apical defect (breast attenuation versus scar), no ischemia, EF 64%; low risk  LHC (01/27/12): prox LAD 20%, mid LAD 99%,  prox OM 30%,  prox RCA 30%, mid RCA 50%, dist RCA 30%,  EF 60%. PCI:2.25 x 16 mm Promus Element DES to mid LAD.  Echo (01/28/12): Mild LVH, EF 55%, apical septum and true apex severe HK, Gr 2 DD, mild LAE, normal RVF  History of Present Illness:    Ms. Karen Osborne was last seen in 1/21 by Daune Perch, NP.  She returns for follow-up.  She is here alone.  She feels like she is more short of breath.  She has difficulty lifting something heavy.  She has not had chest pain, back pain, jaw pain, orthopnea, edema, syncope.        Past Medical History:  Diagnosis Date  . BRCA negative 08/2015   MyRisk neg  . COPD (chronic obstructive pulmonary disease) ([place]) 2019  . Coronary artery disease    a. s/p NSTEMI - LHC 01/27/12: prox LAD 20%, mid LAD 99%, prox OM 30%, prox RCA 30%, mid RCA 50%, dist RCA 30%, EF 60%. PCI: 2.25 x 16 mm Promus Element DES to mid LAD //  b. Myoview (7/15): Fixed  apical defect (breast attenuation versus scar), no ischemia, EF 64%; low risk   . Depression   . Diabetes mellitus without complication ([place]) 3383  . Family history of ovarian cancer   . Glucose intolerance (impaired glucose tolerance)    A1C 6.1 01/2012  . History of echocardiogram    Echo (01/28/12): Mild LVH, EF 55%, apical septum and true apex severe HK, Gr 2 DD, mild LAE, normal RVF // Echo 7/18: mild LVH, EF 60-65, no RWMA, Gr 2 DD, normal RVF  . History of nuclear stress test    Nuclear stress test 4/19: EF 74, apicoseptal defect c/w atten artifact, normal perfusion; Low Risk  . Hypercholesteremia   . Hypertension   . Myocardial infarction ([place])   . NSVD (normal spontaneous vaginal delivery) 1988  . Osteopenia 2016   hip  . Osteoporosis 2019   in spine and femoral neck; at [facility]  . Restless leg syndrome   . Tobacco abuse     Current Medications: Current Meds  Medication Sig  . Blood Glucose Monitoring Suppl (ONE TOUCH ULTRA 2) w/Device KIT 1 each by Does not apply route daily. Use as directed to check sugar once a day  . carvedilol (COREG) 6.25 MG tablet TAKE 1 TABLET BY MOUTH TWICE DAILY WITH A MEAL  . Cholecalciferol (VITAMIN D) 125 MCG (5000 UT) CAPS Take  1 capsule by mouth daily.  . clopidogrel (PLAVIX) 75 MG tablet TAKE 1 TABLET BY MOUTH EVERY DAY  . Cyanocobalamin (B-12) 1000 MCG SUBL Place 1 tablet under the tongue daily.  Marland Kitchen glucose blood (ONE TOUCH ULTRA TEST) test strip 1 each by Other route daily. Use as instructed to check sugar once a day.  . hydrochlorothiazide (MICROZIDE) 12.5 MG capsule Take 1 capsule (12.5 mg total) by mouth daily. Please keep upcoming appt in January 2022 for future refills. Thank you  . Ibuprofen-Diphenhydramine HCl (ADVIL PM) 200-25 MG CAPS Take 1 capsule by mouth at bedtime as needed.  Marland Kitchen lisinopril (ZESTRIL) 20 MG tablet TAKE 1 TABLET BY MOUTH EVERY DAY  . metFORMIN (GLUCOPHAGE-XR) 500 MG 24 hr tablet TAKE 2 TABLETS BY MOUTH EVERY DAY  .  nitroGLYCERIN (NITROSTAT) 0.4 MG SL tablet Place 1 tablet (0.4 mg total) under the tongue every 5 (five) minutes as needed for chest pain (up to 3 doses).  Glory Rosebush DELICA LANCETS 38S MISC 1 each by Does not apply route daily. Use as directed to check sugar once a day.  Marland Kitchen rOPINIRole (REQUIP) 1 MG tablet TAKE 1-2 TABLETS (1-2 MG TOTAL) BY MOUTH AT BEDTIME.  . rosuvastatin (CRESTOR) 40 MG tablet TAKE 1 TABLET BY MOUTH EVERY DAY     Allergies:   Penicillins   Social History   Tobacco Use  . Smoking status: Current Every Day Smoker    Packs/day: 1.50    Years: 48.40    Pack years: 72.60    Types: Cigarettes  . Smokeless tobacco: Never Used  Vaping Use  . Vaping Use: Never used  Substance Use Topics  . Alcohol use: No  . Drug use: No     Family Hx: The patient's family history includes Diabetes in her sister; Heart attack in her father; Heart disease (age of onset: 71) in her father; Hypertension in her father; Kidney cancer in her father; Ovarian cancer (age of onset: 7) in her maternal aunt; Prostate cancer in her father. There is no history of Breast cancer.  Review of Systems  Cardiovascular: Negative for claudication.  Respiratory: Positive for cough (chronic; no change).      EKGs/Labs/Other Test Reviewed:    EKG:  EKG is   ordered today.  The ekg ordered today demonstrates normal sinus rhythm, HR normal axis, no ST-TW changes, QTc 424 ms, similar to prior ECG.  Recent Labs: 01/16/2020: ALT 10; BUN 12; Creatinine, Ser 1.05; Potassium 4.2; Sodium 136   Recent Lipid Panel Lab Results  Component Value Date/Time   CHOL 99 01/16/2020 07:45 AM   CHOL 123 06/25/2017 07:34 AM   TRIG 131.0 01/16/2020 07:45 AM   HDL 29.90 (L) 01/16/2020 07:45 AM   HDL 28 (L) 06/25/2017 07:34 AM   CHOLHDL 3 01/16/2020 07:45 AM   LDLCALC 43 01/16/2020 07:45 AM   LDLCALC 64 06/25/2017 07:34 AM      Risk Assessment/Calculations:      Physical Exam:    VS:  BP (!) 112/56   Pulse 72    Ht _0  (1.676 m)   Wt 177 lb (80.3 kg)   BMI 28.57 kg/m     Wt Readings from Last 3 Encounters:  09/24/20 177 lb (80.3 kg)  07/04/20 183 lb (83 kg)  04/23/20 183 lb (83 kg)     Constitutional:      Appearance: Healthy appearance. Not in distress.  Neck:     Vascular: No JVR. JVD normal.  Pulmonary:  Effort: Pulmonary effort is normal.     Breath sounds: No wheezing. No rales.  Cardiovascular:     Normal rate. Regular rhythm. Normal S1. Normal S2.     Murmurs: There is no murmur.  Edema:    Peripheral edema absent.  Abdominal:     Palpations: Abdomen is soft.  Musculoskeletal:     Cervical back: Neck supple. Skin:    General: Skin is warm and dry.  Neurological:     Mental Status: Alert and oriented to person, place and time.     Cranial Nerves: Cranial nerves are intact.      ASSESSMENT & PLAN:    1. Coronary artery disease involving native coronary artery of native heart without angina pectoris S/p NSTEMI in 2013 tx with DES to LAD.  Myoview in 2019 was low risk.  She notes worsening dyspnea on exertion.  This is likely related to COPD and smoking.  But, as she continues to smoke and has a hx of CAD, I think we should obtain a nuclear stress test to rule out ischemia.  Continue clopidogrel, carvedilol, rosuvastatin.  Arrange Lexiscan Myoview.  F/u in 1 year or sooner if Myoview abnormal.    2. Essential hypertension The patient's blood pressure is controlled on her current regimen.  Continue current therapy.   3. Hyperlipidemia LDL goal <70 LDL optimal on most recent lab work.  Continue current Rx.    4. TOBACCO ABUSE 5. Chronic obstructive pulmonary disease, unspecified COPD type ([place]) We discussed strategies for quitting smoking.  I discussed the role of Chantix.  She would likely be a good candidate for this.  She will let me know if she would like to try it.  Obtain Myoview as noted.  If her Myoview is normal, she will need f/u with pulmonology for her COPD.     Shared Decision Making/Informed Consent The risks [chest pain, shortness of breath, cardiac arrhythmias, dizziness, blood pressure fluctuations, myocardial infarction, stroke/transient ischemic attack, nausea, vomiting, allergic reaction, radiation exposure, metallic taste sensation and life-threatening complications (estimated to be 1 in 10,000)], benefits (risk stratification, diagnosing coronary artery disease, treatment guidance) and alternatives of a nuclear stress test were discussed in detail with Ms. Staron and she agrees to proceed.     Dispo:  Return in about 1 year (around 09/24/2021) for Routine Follow Up, w/ Richardson Dopp, PA-C, in person.   Medication Adjustments/Labs and Tests Ordered: Current medicines are reviewed at length with the patient today.  Concerns regarding medicines are outlined above.  Tests Ordered: Orders Placed This Encounter  Procedures  . Cardiac Stress Test: Informed Consent Details: Physician/Practitioner Attestation; Transcribe to consent form and obtain patient signature  . MYOCARDIAL PERFUSION IMAGING  . EKG 12-Lead   Medication Changes: No orders of the defined types were placed in this encounter.   Signed, Richardson Dopp, PA-C  09/24/2020 8:51 AM    [facility] [address] [place], [place]  [address] Phone: [phone]; Fax: [phone]

## 2020-09-24 ENCOUNTER — Other Ambulatory Visit: Payer: Self-pay

## 2020-09-24 ENCOUNTER — Ambulatory Visit: Payer: BC Managed Care – PPO | Admitting: Physician Assistant

## 2020-09-24 ENCOUNTER — Encounter: Payer: Self-pay | Admitting: Physician Assistant

## 2020-09-24 VITALS — BP 112/56 | HR 72 | Ht 66.0 in | Wt 177.0 lb

## 2020-09-24 DIAGNOSIS — E785 Hyperlipidemia, unspecified: Secondary | ICD-10-CM | POA: Diagnosis not present

## 2020-09-24 DIAGNOSIS — I10 Essential (primary) hypertension: Secondary | ICD-10-CM

## 2020-09-24 DIAGNOSIS — F17200 Nicotine dependence, unspecified, uncomplicated: Secondary | ICD-10-CM

## 2020-09-24 DIAGNOSIS — I2510 Atherosclerotic heart disease of native coronary artery without angina pectoris: Secondary | ICD-10-CM | POA: Diagnosis not present

## 2020-09-24 DIAGNOSIS — J449 Chronic obstructive pulmonary disease, unspecified: Secondary | ICD-10-CM

## 2020-09-24 NOTE — Patient Instructions (Signed)
Medication Instructions:  Your physician recommends that you continue on your current medications as directed. Please refer to the Current Medication list given to you today.  *If you need a refill on your cardiac medications before your next appointment, please call your pharmacy*  Lab Work: None ordered today  Testing/Procedures: Your physician has requested that you have a lexiscan myoview. For further information please visit [email or web address]. Please follow instruction sheet, as given.  Follow-Up: At [facility], you and your health needs are our priority.  As part of our continuing mission to provide you with exceptional heart care, we have created designated Provider Care Teams.  These Care Teams include your primary Cardiologist (physician) and Advanced Practice Providers (APPs -  Physician Assistants and Nurse Practitioners) who all work together to provide you with the care you need, when you need it.  Your next appointment:   12 month(s)  The format for your next appointment:   In Person  Provider:   Richardson Dopp, PA-C

## 2020-09-27 ENCOUNTER — Telehealth (HOSPITAL_COMMUNITY): Payer: Self-pay | Admitting: *Deleted

## 2020-09-27 NOTE — Telephone Encounter (Signed)
Patient given detailed instructions per Myocardial Perfusion Study Information Sheet for the test on 10/04/20 at 7:30. Patient notified to arrive 15 minutes early and that it is imperative to arrive on time for appointment to keep from having the test rescheduled.  If you need to cancel or reschedule your appointment, please call the office within 24 hours of your appointment. . Patient verbalized understanding.Karen Osborne

## 2020-10-04 ENCOUNTER — Encounter (HOSPITAL_COMMUNITY): Payer: BC Managed Care – PPO

## 2020-10-22 ENCOUNTER — Telehealth (HOSPITAL_COMMUNITY): Payer: Self-pay | Admitting: Physician Assistant

## 2020-10-22 NOTE — Telephone Encounter (Signed)
Pt cancelled Myoview on 10/25/20  due to father in [place] and will call back when she is ready to reschedule/LBW 10:17 Order will be removed from the [place] and if she calls back to reschedule we will reinstate the order. Thank you.

## 2020-10-22 NOTE — Telephone Encounter (Signed)
Ok.  Thanks for letting me know. Richardson Dopp, PA-C    10/22/2020 2:15 PM

## 2020-10-25 ENCOUNTER — Encounter (HOSPITAL_COMMUNITY): Payer: BC Managed Care – PPO

## 2020-11-22 ENCOUNTER — Other Ambulatory Visit: Payer: Self-pay | Admitting: Cardiovascular Disease

## 2020-11-24 ENCOUNTER — Other Ambulatory Visit: Payer: Self-pay | Admitting: Cardiovascular Disease

## 2020-11-24 DIAGNOSIS — I10 Essential (primary) hypertension: Secondary | ICD-10-CM

## 2020-11-24 DIAGNOSIS — I2510 Atherosclerotic heart disease of native coronary artery without angina pectoris: Secondary | ICD-10-CM

## 2020-12-01 ENCOUNTER — Other Ambulatory Visit: Payer: Self-pay | Admitting: Cardiovascular Disease

## 2020-12-01 DIAGNOSIS — E7800 Pure hypercholesterolemia, unspecified: Secondary | ICD-10-CM

## 2020-12-01 DIAGNOSIS — I10 Essential (primary) hypertension: Secondary | ICD-10-CM

## 2021-01-16 ENCOUNTER — Other Ambulatory Visit: Payer: Self-pay | Admitting: Family Medicine

## 2021-01-16 DIAGNOSIS — E538 Deficiency of other specified B group vitamins: Secondary | ICD-10-CM

## 2021-01-16 DIAGNOSIS — E1169 Type 2 diabetes mellitus with other specified complication: Secondary | ICD-10-CM

## 2021-01-16 DIAGNOSIS — E7800 Pure hypercholesterolemia, unspecified: Secondary | ICD-10-CM

## 2021-01-16 DIAGNOSIS — M810 Age-related osteoporosis without current pathological fracture: Secondary | ICD-10-CM

## 2021-01-17 ENCOUNTER — Other Ambulatory Visit: Payer: BC Managed Care – PPO

## 2021-01-20 ENCOUNTER — Other Ambulatory Visit: Payer: Self-pay

## 2021-01-20 ENCOUNTER — Other Ambulatory Visit (INDEPENDENT_AMBULATORY_CARE_PROVIDER_SITE_OTHER): Payer: BC Managed Care – PPO

## 2021-01-20 DIAGNOSIS — E538 Deficiency of other specified B group vitamins: Secondary | ICD-10-CM | POA: Diagnosis not present

## 2021-01-20 DIAGNOSIS — M810 Age-related osteoporosis without current pathological fracture: Secondary | ICD-10-CM

## 2021-01-20 DIAGNOSIS — E1169 Type 2 diabetes mellitus with other specified complication: Secondary | ICD-10-CM | POA: Diagnosis not present

## 2021-01-20 DIAGNOSIS — E7800 Pure hypercholesterolemia, unspecified: Secondary | ICD-10-CM | POA: Diagnosis not present

## 2021-01-20 LAB — LIPID PANEL
Cholesterol: 96 mg/dL (ref 0–200)
HDL: 34.3 mg/dL — ABNORMAL LOW (ref >39.00)
LDL Cholesterol: 43 mg/dL (ref 0–99)
NonHDL: 61.23
Total CHOL/HDL Ratio: 3
Triglycerides: 91 mg/dL (ref 0.0–149.0)
VLDL: 18.2 mg/dL (ref 0.0–40.0)

## 2021-01-20 LAB — COMPREHENSIVE METABOLIC PANEL
ALT: 12 U/L (ref 0–35)
AST: 12 U/L (ref 0–37)
Albumin: 4.2 g/dL (ref 3.5–5.2)
Alkaline Phosphatase: 65 U/L (ref 39–117)
BUN: 11 mg/dL (ref 6–23)
CO2: 28 mEq/L (ref 19–32)
Calcium: 9.6 mg/dL (ref 8.4–10.5)
Chloride: 103 mEq/L (ref 96–112)
Creatinine, Ser: 0.92 mg/dL (ref 0.40–1.20)
GFR: 65.96 mL/min (ref >60.00)
Glucose, Bld: 104 mg/dL — ABNORMAL HIGH (ref 70–99)
Potassium: 4.9 mEq/L (ref 3.5–5.1)
Sodium: 139 mEq/L (ref 135–145)
Total Bilirubin: 0.6 mg/dL (ref 0.2–1.2)
Total Protein: 6.2 g/dL (ref 6.0–8.3)

## 2021-01-20 LAB — VITAMIN D 25 HYDROXY (VIT D DEFICIENCY, FRACTURES): VITD: 74.89 ng/mL (ref 30.00–100.00)

## 2021-01-20 LAB — VITAMIN B12: Vitamin B-12: 764 pg/mL (ref 211–911)

## 2021-01-20 LAB — HEMOGLOBIN A1C: Hgb A1c MFr Bld: 6.2 % (ref 4.6–6.5)

## 2021-01-24 ENCOUNTER — Encounter: Payer: Self-pay | Admitting: Family Medicine

## 2021-01-24 ENCOUNTER — Ambulatory Visit (INDEPENDENT_AMBULATORY_CARE_PROVIDER_SITE_OTHER): Payer: BC Managed Care – PPO | Admitting: Family Medicine

## 2021-01-24 ENCOUNTER — Other Ambulatory Visit: Payer: Self-pay

## 2021-01-24 VITALS — BP 136/62 | HR 80 | Temp 97.9°F | Ht 64.5 in | Wt 173.1 lb

## 2021-01-24 DIAGNOSIS — E538 Deficiency of other specified B group vitamins: Secondary | ICD-10-CM

## 2021-01-24 DIAGNOSIS — I700 Atherosclerosis of aorta: Secondary | ICD-10-CM

## 2021-01-24 DIAGNOSIS — F418 Other specified anxiety disorders: Secondary | ICD-10-CM

## 2021-01-24 DIAGNOSIS — Z0000 Encounter for general adult medical examination without abnormal findings: Secondary | ICD-10-CM

## 2021-01-24 DIAGNOSIS — I10 Essential (primary) hypertension: Secondary | ICD-10-CM

## 2021-01-24 DIAGNOSIS — Z23 Encounter for immunization: Secondary | ICD-10-CM | POA: Diagnosis not present

## 2021-01-24 DIAGNOSIS — G2581 Restless legs syndrome: Secondary | ICD-10-CM

## 2021-01-24 DIAGNOSIS — F17200 Nicotine dependence, unspecified, uncomplicated: Secondary | ICD-10-CM

## 2021-01-24 DIAGNOSIS — E1169 Type 2 diabetes mellitus with other specified complication: Secondary | ICD-10-CM

## 2021-01-24 DIAGNOSIS — E7800 Pure hypercholesterolemia, unspecified: Secondary | ICD-10-CM

## 2021-01-24 DIAGNOSIS — J449 Chronic obstructive pulmonary disease, unspecified: Secondary | ICD-10-CM

## 2021-01-24 DIAGNOSIS — I2510 Atherosclerotic heart disease of native coronary artery without angina pectoris: Secondary | ICD-10-CM

## 2021-01-24 DIAGNOSIS — M810 Age-related osteoporosis without current pathological fracture: Secondary | ICD-10-CM

## 2021-01-24 LAB — IBC PANEL
Iron: 71 ug/dL (ref 42–145)
Saturation Ratios: 22.5 % (ref 20.0–50.0)
Transferrin: 225 mg/dL (ref 212.0–360.0)

## 2021-01-24 LAB — TSH: TSH: 1.34 u[IU]/mL (ref 0.35–4.50)

## 2021-01-24 LAB — FERRITIN: Ferritin: 50.7 ng/mL (ref 10.0–291.0)

## 2021-01-24 MED ORDER — METFORMIN HCL ER 500 MG PO TB24: 1000.0000 mg | ORAL_TABLET | Freq: Every day | ORAL | 3 refills | Status: DC

## 2021-01-24 MED ORDER — ROPINIROLE HCL 1 MG PO TABS: 1.0000 mg | ORAL_TABLET | Freq: Every day | ORAL | 2 refills | Status: DC

## 2021-01-24 MED ORDER — B-12 1000 MCG SL SUBL: 1.0000 | SUBLINGUAL_TABLET | SUBLINGUAL | Status: DC

## 2021-01-24 MED ORDER — VITAMIN D 125 MCG (5000 UT) PO CAPS: 1.0000 | ORAL_CAPSULE | ORAL | Status: AC

## 2021-01-24 NOTE — Assessment & Plan Note (Signed)
Chronic, stable. Continue current regimen. 

## 2021-01-24 NOTE — Assessment & Plan Note (Signed)
Preventative protocols reviewed and updated unless pt declined. Discussed healthy diet and lifestyle.  

## 2021-01-24 NOTE — Assessment & Plan Note (Signed)
Continue statin, plavix.  

## 2021-01-24 NOTE — Assessment & Plan Note (Signed)
Chronic, great control on crestor - continue. The ASCVD Risk score Mikey Bussing DC Jr., et al., 2013) failed to calculate for the following reasons:   The valid total cholesterol range is 130 to 320 mg/dL

## 2021-01-24 NOTE — Assessment & Plan Note (Signed)
Stable period off medication.  

## 2021-01-24 NOTE — Assessment & Plan Note (Signed)
Continue MWF replacement.  

## 2021-01-24 NOTE — Assessment & Plan Note (Signed)
Did not complete last year. Will reorder.

## 2021-01-24 NOTE — Assessment & Plan Note (Signed)
Ongoing despite ropinirole 1mg  nightly. Symptoms suggest concomitant etiology such as knee arthritis. rec trial tylenol during the day, consider glucosamine supplement. Will check iron panel and TSH today

## 2021-01-24 NOTE — Assessment & Plan Note (Signed)
Chronic, stable on metformin XR 1000mg  daily.

## 2021-01-24 NOTE — Patient Instructions (Addendum)
Final shingrix vaccine today. Tdap today.  Call to schedule mammogram at your convenience: [facility] in [place] [phone]  You are doing well today  Schedule eye exam.  Return as needed or in 1 year for next physical or return for welcome to medicare visit.   Health Maintenance After Age 64 After age 61, you are at a higher risk for certain long-term diseases and infections as well as injuries from falls. Falls are a major cause of broken bones and head injuries in people who are older than age 51. Getting regular preventive care can help to keep you healthy and well. Preventive care includes getting regular testing and making lifestyle changes as recommended by your health care provider. Talk with your health care provider about:  Which screenings and tests you should have. A screening is a test that checks for a disease when you have no symptoms.  A diet and exercise plan that is right for you. What should I know about screenings and tests to prevent falls? Screening and testing are the best ways to find a health problem early. Early diagnosis and treatment give you the best chance of managing medical conditions that are common after age 67. Certain conditions and lifestyle choices may make you more likely to have a fall. Your health care provider may recommend:  Regular vision checks. Poor vision and conditions such as cataracts can make you more likely to have a fall. If you wear glasses, make sure to get your prescription updated if your vision changes.  Medicine review. Work with your health care provider to regularly review all of the medicines you are taking, including over-the-counter medicines. Ask your health care provider about any side effects that may make you more likely to have a fall. Tell your health care provider if any medicines that you take make you feel dizzy or sleepy.  Osteoporosis screening. Osteoporosis is a condition that causes the bones to get weaker. This  can make the bones weak and cause them to break more easily.  Blood pressure screening. Blood pressure changes and medicines to control blood pressure can make you feel dizzy.  Strength and balance checks. Your health care provider may recommend certain tests to check your strength and balance while standing, walking, or changing positions.  Foot health exam. Foot pain and numbness, as well as not wearing proper footwear, can make you more likely to have a fall.  Depression screening. You may be more likely to have a fall if you have a fear of falling, feel emotionally low, or feel unable to do activities that you used to do.  Alcohol use screening. Using too much alcohol can affect your balance and may make you more likely to have a fall. What actions can I take to lower my risk of falls? General instructions  Talk with your health care provider about your risks for falling. Tell your health care provider if: ? You fall. Be sure to tell your health care provider about all falls, even ones that seem minor. ? You feel dizzy, sleepy, or off-balance.  Take over-the-counter and prescription medicines only as told by your health care provider. These include any supplements.  Eat a healthy diet and maintain a healthy weight. A healthy diet includes low-fat dairy products, low-fat (lean) meats, and fiber from whole grains, beans, and lots of fruits and vegetables. Home safety  Remove any tripping hazards, such as rugs, cords, and clutter.  Install safety equipment such as grab bars in  bathrooms and safety rails on stairs.  Keep rooms and walkways well-lit. Activity  Follow a regular exercise program to stay fit. This will help you maintain your balance. Ask your health care provider what types of exercise are appropriate for you.  If you need a cane or walker, use it as recommended by your health care provider.  Wear supportive shoes that have nonskid soles.   Lifestyle  Do not drink  alcohol if your health care provider tells you not to drink.  If you drink alcohol, limit how much you have: ? 0-1 drink a day for women. ? 0-2 drinks a day for men.  Be aware of how much alcohol is in your drink. In the [place] one drink equals one typical bottle of beer (12 oz), one-half glass of wine (5 oz), or one shot of hard liquor (1 oz).  Do not use any products that contain nicotine or tobacco, such as cigarettes and e-cigarettes. If you need help quitting, ask your health care provider. Summary  Having a healthy lifestyle and getting preventive care can help to protect your health and wellness after age 42.  Screening and testing are the best way to find a health problem early and help you avoid having a fall. Early diagnosis and treatment give you the best chance for managing medical conditions that are more common for people who are older than age 73.  Falls are a major cause of broken bones and head injuries in people who are older than age 42. Take precautions to prevent a fall at home.  Work with your health care provider to learn what changes you can make to improve your health and wellness and to prevent falls. This information is not intended to replace advice given to you by your health care provider. Make sure you discuss any questions you have with your health care provider. Document Revised: 12/22/2018 Document Reviewed: 07/14/2017 Elsevier Patient Education  2021 [employer].

## 2021-01-24 NOTE — Assessment & Plan Note (Signed)
Improving period off medication.

## 2021-01-24 NOTE — Progress Notes (Signed)
Patient ID: Karen Osborne, female    DOB: 1956/01/19, 65 y.o.   MRN: [id number]  This visit was conducted in person.  BP 136/62   Pulse 80   Temp 97.9 F (36.6 C) (Temporal)   Ht 5' 4.5" (1.638 m)   Wt 173 lb 2 oz (78.5 kg)   SpO2 99%   BMI 29.26 kg/m    CC: CPE  Subjective:   HPI: Karen Osborne is a 65 y.o. female presenting on 01/24/2021 for Annual Exam (Wants to discuss ropinirole. )   Caring for 47yo father on hospice at home.  DM - on metformin XR 572m bid with good control. RLS - takes requip 185mand drinks water. Some nausea to medicine. Night time symptoms overall well controlled. Describes lateral hip pain as well as stretching feeling to legs. Tried copper knee brace with benefit.  Known CAD s/p NSTEMI 2013 with DES to LAD. Myoview 2019 low risk. Continues plavix, carvedilol, rosuvastatin.  COPD - stable period off medication. Denies dyspnea. Chronic cough.   Preventative: COLONOSCOPY WITH PROPOFOL 08/19/2018 - TAs, rpt 3 yrs ([place], [place]) Breast cancer screening - mammo 06/2018 - rec additional views - [facility] will call [facility] to inquire  Well woman exam with [facility] - s/p TAH w/ BSO - sees AlFraser DinP every 1-2 yrs.  Lung cancer screening - undergoing, latest 06/2020, started 06/2018 DEXA scan - overdue - did not complete last year. Will forward request to referral coordinators.  Flu shot - yearly Tetanus shot - 2002, [place], 12/[place], booster 09/2020 Pneumovax 2013 Shingrix - 01/2020, rpt due - today Advanced directive discussion:  Seat belt use discussed Sunscreen use discussed. No changing moles on skin.  Current smoker 1/2 ppd  Alcohol - none  Dentist - full dentures placed 08/2020  Eye exam - yearly   Lives with husband and son Occ: sales (pipe fitting and valves) Activity: some walking Diet: good water,  fruits/vegetables daily     Relevant past medical, surgical, family and social history reviewed and updated as indicated. Interim medical history since our last visit reviewed. Allergies and medications reviewed and updated. Outpatient Medications Prior to Visit  Medication Sig Dispense Refill  . Blood Glucose Monitoring Suppl (ONE TOUCH ULTRA 2) w/Device KIT 1 each by Does not apply route daily. Use as directed to check sugar once a day 1 each 0  . carvedilol (COREG) 6.25 MG tablet TAKE 1 TABLET BY MOUTH TWICE DAILY WITH A MEAL 180 tablet 2  . clopidogrel (PLAVIX) 75 MG tablet TAKE 1 TABLET BY MOUTH EVERY DAY 90 tablet 3  . glucose blood (ONE TOUCH ULTRA TEST) test strip 1 each by Other route daily. Use as instructed to check sugar once a day. 100 each 0  . hydrochlorothiazide (MICROZIDE) 12.5 MG capsule Take 1 capsule (12.5 mg total) by mouth daily. 90 capsule 2  . Ibuprofen-Diphenhydramine HCl (ADVIL PM) 200-25 MG CAPS Take 1 capsule by mouth at bedtime as needed.    . Marland Kitchenisinopril (ZESTRIL) 20 MG tablet TAKE 1 TABLET BY MOUTH EVERY DAY 90 tablet 3  . nitroGLYCERIN (NITROSTAT) 0.4 MG SL tablet Place 1 tablet (0.4 mg total) under the tongue every 5 (five) minutes as needed for chest pain (up to 3 doses). 25 tablet 3  . ONETOUCH DELICA LANCETS 3375ZISC 1 each by Does  not apply route daily. Use as directed to check sugar once a day. 100 each 0  . rosuvastatin (CRESTOR) 40 MG tablet TAKE 1 TABLET BY MOUTH EVERY DAY 90 tablet 3  . metFORMIN (GLUCOPHAGE-XR) 500 MG 24 hr tablet TAKE 2 TABLETS BY MOUTH EVERY DAY 180 tablet 2  . rOPINIRole (REQUIP) 1 MG tablet TAKE 1-2 TABLETS (1-2 MG TOTAL) BY MOUTH AT BEDTIME. 180 tablet 1  . Cholecalciferol (VITAMIN D) 125 MCG (5000 UT) CAPS Take 1 capsule by mouth daily.    . Cyanocobalamin (B-12) 1000 MCG SUBL Place 1 tablet under the tongue daily. 30 each    No facility-administered medications prior to visit.     Per HPI unless specifically indicated in ROS  section below Review of Systems  Constitutional: Negative for activity change, appetite change, chills, fatigue, fever and unexpected weight change.  HENT: Negative for hearing loss.   Eyes: Negative for visual disturbance.  Respiratory: Positive for cough. Negative for chest tightness, shortness of breath and wheezing.   Cardiovascular: Negative for chest pain, palpitations and leg swelling.  Gastrointestinal: Negative for abdominal distention, abdominal pain, blood in stool, constipation, diarrhea, nausea and vomiting.  Genitourinary: Negative for difficulty urinating and hematuria.  Musculoskeletal: Negative for arthralgias, myalgias and neck pain.  Skin: Negative for rash.  Neurological: Negative for dizziness, seizures, syncope and headaches.  Hematological: Negative for adenopathy. Does not bruise/bleed easily.  Psychiatric/Behavioral: Negative for dysphoric mood. The patient is not nervous/anxious.    Objective:  BP 136/62   Pulse 80   Temp 97.9 F (36.6 C) (Temporal)   Ht 5' 4.5" (1.638 m)   Wt 173 lb 2 oz (78.5 kg)   SpO2 99%   BMI 29.26 kg/m   Wt Readings from Last 3 Encounters:  01/24/21 173 lb 2 oz (78.5 kg)  09/24/20 177 lb (80.3 kg)  07/04/20 183 lb (83 kg)      Physical Exam Vitals and nursing note reviewed.  Constitutional:      General: She is not in acute distress.    Appearance: Normal appearance. She is well-developed. She is not ill-appearing.  HENT:     Head: Normocephalic and atraumatic.     Right Ear: Hearing, tympanic membrane, ear canal and external ear normal.     Left Ear: Hearing, tympanic membrane, ear canal and external ear normal.  Eyes:     General: No scleral icterus.    Extraocular Movements: Extraocular movements intact.     Conjunctiva/sclera: Conjunctivae normal.     Pupils: Pupils are equal, round, and reactive to light.  Neck:     Thyroid: No thyroid mass or thyromegaly.     Vascular: No carotid bruit.  Cardiovascular:     Rate  and Rhythm: Normal rate and regular rhythm.     Pulses: Normal pulses.          Radial pulses are 2+ on the right side and 2+ on the left side.     Heart sounds: Normal heart sounds. No murmur heard.   Pulmonary:     Effort: Pulmonary effort is normal. No respiratory distress.     Breath sounds: Normal breath sounds. No wheezing, rhonchi or rales.  Abdominal:     General: Bowel sounds are normal. There is no distension.     Palpations: Abdomen is soft. There is no mass.     Tenderness: There is no abdominal tenderness. There is no guarding or rebound.     Hernia: No hernia is present.  Musculoskeletal:        General: Normal range of motion.     Cervical back: Normal range of motion and neck supple.     Right lower leg: No edema.     Left lower leg: No edema.  Lymphadenopathy:     Cervical: No cervical adenopathy.  Skin:    General: Skin is warm and dry.     Findings: No rash.  Neurological:     General: No focal deficit present.     Mental Status: She is alert and oriented to person, place, and time.     Comments: CN grossly intact, station and gait intact  Psychiatric:        Mood and Affect: Mood normal.        Behavior: Behavior normal.        Thought Content: Thought content normal.        Judgment: Judgment normal.       Results for orders placed or performed in visit on 01/20/21  Hemoglobin A1c  Result Value Ref Range   Hgb A1c MFr Bld 6.2 4.6 - 6.5 %  Comprehensive metabolic panel  Result Value Ref Range   Sodium 139 135 - 145 mEq/L   Potassium 4.9 3.5 - 5.1 mEq/L   Chloride 103 96 - 112 mEq/L   CO2 28 19 - 32 mEq/L   Glucose, Bld 104 (H) 70 - 99 mg/dL   BUN 11 6 - 23 mg/dL   Creatinine, Ser 0.92 0.40 - 1.20 mg/dL   Total Bilirubin 0.6 0.2 - 1.2 mg/dL   Alkaline Phosphatase 65 39 - 117 U/L   AST 12 0 - 37 U/L   ALT 12 0 - 35 U/L   Total Protein 6.2 6.0 - 8.3 g/dL   Albumin 4.2 3.5 - 5.2 g/dL   GFR 65.96 >60.00 mL/min   Calcium 9.6 8.4 - 10.5 mg/dL   Lipid panel  Result Value Ref Range   Cholesterol 96 0 - 200 mg/dL   Triglycerides 91.0 0.0 - 149.0 mg/dL   HDL 34.30 (L) >39.00 mg/dL   VLDL 18.2 0.0 - 40.0 mg/dL   LDL Cholesterol 43 0 - 99 mg/dL   Total CHOL/HDL Ratio 3    NonHDL 61.23   VITAMIN D 25 Hydroxy (Vit-D Deficiency, Fractures)  Result Value Ref Range   VITD 74.89 30.00 - 100.00 ng/mL  Vitamin B12  Result Value Ref Range   Vitamin B-12 764 211 - 911 pg/mL   Depression screen [facility] 2/9 01/24/2021 01/23/2020 02/04/2018  Decreased Interest 2 2 0  Down, Depressed, Hopeless 2 1 0  PHQ - 2 Score 4 3 0  Altered sleeping 1 2 -  Tired, decreased energy 2 2 -  Change in appetite 2 1 -  Feeling bad or failure about yourself  0 0 -  Trouble concentrating 0 2 -  Moving slowly or fidgety/restless 0 0 -  Suicidal thoughts 0 0 -  PHQ-9 Score 9 10 -    GAD 7 : Generalized Anxiety Score 01/24/2021 01/23/2020  Nervous, Anxious, on Edge 2 1  Control/stop worrying 2 2  Worry too much - different things 2 2  Trouble relaxing 1 2  Restless 1 1  Easily annoyed or irritable 2 2  Afraid - awful might happen 3 2  Total GAD 7 Score 13 12   Assessment & Plan:  This visit occurred during the SARS-CoV-2 public health emergency.  Safety protocols were in place, including screening questions prior to  the visit, additional usage of staff PPE, and extensive cleaning of exam room while observing appropriate contact time as indicated for disinfecting solutions.   Problem List Items Addressed This Visit    HYPERCHOLESTEROLEMIA    Chronic, great control on crestor - continue. The ASCVD Risk score Mikey Bussing DC Jr., et al., 2013) failed to calculate for the following reasons:   The valid total cholesterol range is 130 to 320 mg/dL       TOBACCO ABUSE    Continue to encourage cessation. She will let me know if desires assistance in quitting.  Undergoing lung cancer screening program.       RESTLESS LEG SYNDROME    Ongoing despite ropinirole 32m  nightly. Symptoms suggest concomitant etiology such as knee arthritis. rec trial tylenol during the day, consider glucosamine supplement. Will check iron panel and TSH today       Relevant Orders   Ferritin   IBC panel   TSH   Essential hypertension    Chronic, stable. Continue current regimen.       Osteoporosis    Did not complete last year. Will reorder.       Relevant Medications   Cholecalciferol (VITAMIN D) 125 MCG (5000 UT) CAPS   Other Relevant Orders   DG Bone Density   Depression with anxiety    Improving period off medication.       CAD (coronary artery disease)   Type 2 diabetes mellitus with other specified complication (HCC)    Chronic, stable on metformin XR 10045mdaily.       Relevant Medications   metFORMIN (GLUCOPHAGE-XR) 500 MG 24 hr tablet   COPD (chronic obstructive pulmonary disease) (HCC)    Stable period off medication.       Thoracic aorta atherosclerosis (HCC)    Continue statin, plavix.       Vitamin B12 deficiency    Continue MWF replacement.       Health maintenance examination - Primary    Preventative protocols reviewed and updated unless pt declined. Discussed healthy diet and lifestyle.        Other Visit Diagnoses    Need for shingles vaccine       Relevant Orders   Varicella-zoster vaccine IM (Completed)   Need for Tdap vaccination       Relevant Orders   Tdap vaccine greater than or equal to 7yo IM (Completed)       Meds ordered this encounter  Medications  . Cholecalciferol (VITAMIN D) 125 MCG (5000 UT) CAPS    Sig: Take 1 capsule by mouth every Monday, Wednesday, and Friday.  . Cyanocobalamin (B-12) 1000 MCG SUBL    Sig: Place 1 tablet under the tongue every Monday, Wednesday, and Friday.  . metFORMIN (GLUCOPHAGE-XR) 500 MG 24 hr tablet    Sig: Take 2 tablets (1,000 mg total) by mouth daily.    Dispense:  180 tablet    Refill:  3  . rOPINIRole (REQUIP) 1 MG tablet    Sig: Take 1-2 tablets (1-2 mg total) by  mouth at bedtime.    Dispense:  180 tablet    Refill:  2   Orders Placed This Encounter  Procedures  . DG Bone Density    Standing Status:   Future    Standing Expiration Date:   01/24/2022    Scheduling Instructions:     [facility]    Order Specific Question:   Reason for Exam (SYMPTOM  OR DIAGNOSIS REQUIRED)  Answer:   osteoporosis    Order Specific Question:   Preferred imaging location?    Answer:   External  . Tdap vaccine greater than or equal to 7yo IM  . Varicella-zoster vaccine IM  . Ferritin  . IBC panel  . TSH    Patient instructions: Final shingrix vaccine today. Tdap today.  Call to schedule mammogram at your convenience: [facility] in [place] [phone]  You are doing well today  Schedule eye exam.  Return as needed or in 1 year for next physical or return for welcome to medicare visit.   Follow up plan: Return in about 1 year (around 01/24/2022) for annual exam, prior fasting for blood work, medicare wellness visit.  [name], MD

## 2021-01-24 NOTE — Assessment & Plan Note (Signed)
Continue to encourage cessation. She will let me know if desires assistance in quitting.  Undergoing lung cancer screening program.

## 2021-02-28 ENCOUNTER — Other Ambulatory Visit: Payer: Self-pay | Admitting: Cardiovascular Disease

## 2021-03-04 ENCOUNTER — Telehealth: Payer: Self-pay

## 2021-03-04 NOTE — Telephone Encounter (Signed)
Patient states that she has been taking her morning medications later in the day because she does not eat early. She has started waking up in middle of the night with sore throat and little cough. She drinks water and it goes away. She wanted to know if it could be because she is taking her medications two close together. She has had some drainage from nose and has been sleeping with mouth open at night. After she drinks little water she is able to go back to sleep. No other symptoms.

## 2021-03-04 NOTE — Telephone Encounter (Signed)
Which meds is she referring to? All of them? What times is she taking them?  Carvedilol and requip could cause dry mouth so possibly.

## 2021-03-05 NOTE — Telephone Encounter (Addendum)
First time she mentions vomiting.  If vomiting, please schedule OV to review.

## 2021-03-05 NOTE — Telephone Encounter (Signed)
Spoke with pt asking which meds she's referring to.  She was thinking it was metformin, carvedilol and HCTZ.  However, says she has taken them for yrs without issues.  I relayed Dr. Synthia Innocent message.  Pt agrees that might be it.  She tried drinking water last night, not helpful.  Still had "the feeling in her throat, she vomited, felt better and went back to bed.  Says it's been like this, off and on, about 1 mo.  Plz advise.  Pt gives permission to lvm.

## 2021-03-06 NOTE — Telephone Encounter (Signed)
Left a VM for pt telling her that if the vomiting has continued to please call and make an OV appt.

## 2021-03-25 ENCOUNTER — Telehealth: Payer: Self-pay | Admitting: Physician Assistant

## 2021-03-25 NOTE — Telephone Encounter (Signed)
Pt is calling because she could not have her stress test in 01/22 so she wants to know if Richardson Dopp can put an order in for her to have one now. Please advise pt further

## 2021-03-26 NOTE — Telephone Encounter (Signed)
Ask if she has had any worsening shortness of breath since Jan 2022 or any chest pain. If yes, she needs a f/u appt. If no, ok to reschedule Myoview (looks like she had to cancel in Jan due to her father entering Hospice).  Richardson Dopp, PA-C    03/26/2021 3:51 PM

## 2021-03-28 NOTE — Telephone Encounter (Signed)
S/w pt has had worsening shortness of breath but no chest pain. Pt will come in on Monday, July 18 for appointment with Richardson Dopp, PA with EKG.  Will send to [facility] to [place].

## 2021-03-30 NOTE — Progress Notes (Signed)
Cardiology Office Note:    Date:  03/31/2021   ID:  Karen Osborne, DOB 31-Dec-1955, MRN [id number]  PCP:  [name], [place] Providers Cardiologist:  Sherren Mocha, MD Cardiology APP:  Sharmon Revere      Referring MD: [name], MD   Chief Complaint:  No chief complaint on file.    Patient Profile:    Karen Osborne is a 65 y.o. female with:  Coronary artery disease S/p non-STEMI 5/13>>PCI: DES to the mid LAD Echo 7/18: EF 60-65, GRII DD Myoview 4/19: Low risk Hypertension Hyperlipidemia Tobacco use Diabetes mellitus Aortic atherosclerosis COPD   Prior CV studies: Nuclear stress test 01/07/2018 EF 74, tiny apicoseptal fixed defect unchanged from 2015 (likely attenuation artifact), normal perfusion, low risk   Echo 03/16/2017 Mild LVH, EF 37-34, grade 2 diastolic dysfunction, normal wall motion   Myoview (7/15):   Fixed apical defect (breast attenuation versus scar), no ischemia, EF 64%; low risk   LHC (01/27/12):   prox LAD 20%, mid LAD 99%, prox OM 30%, prox RCA 30%, mid RCA 50%, dist RCA 30%, EF 60%.   PCI:  2.25 x 16 mm Promus Element DES to mid LAD.      Echo (01/28/12):   Mild LVH, EF 55%, apical septum and true apex severe HK, Gr 2 DD, mild LAE, normal RVF  History of Present Illness: Ms. Karen Osborne was last seen in 1/22.  She noted symptoms of worsening shortness of breath with exertion.  I recommended obtaining a Myoview but she had to cancel when her father was admitted to Hospice.  She recently called in to reschedule the test.  She felt like her breathing was getting worse.  Therefore, she was brought in for f/u.    She is here alone. Unfortunately, her father passed away in 2023-01-29. Over the past couple weeks, she has noted some left arm heaviness as well as chest heaviness. This does not seem to be related to exertion. It is not worsened by exertion. She has chronic shortness of breath with exertion. This seems to gradually be  getting worse. She continues to smoke cigarettes. She has not had syncope, orthopnea, leg edema.      Past Medical History:  Diagnosis Date   BRCA negative 08/2015   MyRisk neg   COPD (chronic obstructive pulmonary disease) ([place]) 2019   Coronary artery disease    a. s/p NSTEMI - LHC 01/27/12: prox LAD 20%, mid LAD 99%, prox OM 30%, prox RCA 30%, mid RCA 50%, dist RCA 30%, EF 60%.  PCI:  2.25 x 16 mm Promus Element DES to mid LAD //  b. Myoview (7/15):  Fixed apical defect (breast attenuation versus scar), no ischemia, EF 64%; low risk    Depression    Diabetes mellitus without complication ([place]) 2876   Family history of ovarian cancer    Glucose intolerance (impaired glucose tolerance)    A1C 6.1 2012-01-29   History of echocardiogram    Echo (01/28/12):  Mild LVH, EF 55%, apical septum and true apex severe HK, Gr 2 DD, mild LAE, normal RVF // Echo 7/18: mild LVH, EF 60-65, no RWMA, Gr 2 DD, normal RVF   History of nuclear stress test    Nuclear stress test 4/19: EF 74, apicoseptal defect c/w atten artifact, normal perfusion; Low Risk   Hypercholesteremia    Hypertension    Myocardial infarction ([place])    NSVD (normal spontaneous vaginal delivery) 1988  Osteopenia 2016   hip   Osteoporosis 2019   in spine and femoral neck; at [facility]   Restless leg syndrome    Tobacco abuse     Current Medications: Current Meds  Medication Sig   Blood Glucose Monitoring Suppl (ONE TOUCH ULTRA 2) w/Device KIT 1 each by Does not apply route daily. Use as directed to check sugar once a day   carvedilol (COREG) 6.25 MG tablet TAKE 1 TABLET BY MOUTH TWICE DAILY WITH A MEAL   Cholecalciferol (VITAMIN D) 125 MCG (5000 UT) CAPS Take 1 capsule by mouth every Monday, Wednesday, and Friday.   clopidogrel (PLAVIX) 75 MG tablet TAKE 1 TABLET BY MOUTH EVERY DAY   Cyanocobalamin (B-12) 1000 MCG SUBL Place 1 tablet under the tongue every Monday, Wednesday, and Friday.   glucose blood (ONE TOUCH ULTRA TEST) test strip  1 each by Other route daily. Use as instructed to check sugar once a day.   hydrochlorothiazide (MICROZIDE) 12.5 MG capsule Take 1 capsule (12.5 mg total) by mouth daily.   Ibuprofen-Diphenhydramine HCl (ADVIL PM) 200-25 MG CAPS Take 1 capsule by mouth at bedtime as needed.   isosorbide mononitrate (IMDUR) 30 MG 24 hr tablet Take 0.5 tablets (15 mg total) by mouth daily.   lisinopril (ZESTRIL) 20 MG tablet TAKE 1 TABLET BY MOUTH EVERY DAY   metFORMIN (GLUCOPHAGE-XR) 500 MG 24 hr tablet Take 2 tablets (1,000 mg total) by mouth daily.   nitroGLYCERIN (NITROSTAT) 0.4 MG SL tablet Place 1 tablet (0.4 mg total) under the tongue every 5 (five) minutes as needed for chest pain (up to 3 doses).   ONETOUCH DELICA LANCETS 47Q MISC 1 each by Does not apply route daily. Use as directed to check sugar once a day.   rOPINIRole (REQUIP) 1 MG tablet Take 1-2 tablets (1-2 mg total) by mouth at bedtime.   rosuvastatin (CRESTOR) 40 MG tablet TAKE 1 TABLET BY MOUTH EVERY DAY   [DISCONTINUED] isosorbide mononitrate (IMDUR) 30 MG 24 hr tablet Take 0.5 tablets (15 mg total) by mouth daily.     Allergies:   Penicillins   Social History   Tobacco Use   Smoking status: Every Day    Packs/day: 1.50    Years: 48.40    Pack years: 72.60    Types: Cigarettes   Smokeless tobacco: Never  Vaping Use   Vaping Use: Never used  Substance Use Topics   Alcohol use: No   Drug use: No     Family Hx: The patient's family history includes Diabetes in her sister; Heart attack in her father; Heart disease (age of onset: 25) in her father; Hypertension in her father; Kidney cancer in her father; Ovarian cancer (age of onset: 51) in her maternal aunt; Prostate cancer in her father. There is no history of Breast cancer.  ROS   EKGs/Labs/Other Test Reviewed:    EKG: NSR, HR 71, normal axis, nonspecific ST-T wave changes, low voltage, QTC 408, no change from prior tracing     Recent Labs: 01/20/2021: ALT 12; BUN 11;  Creatinine, Ser 0.92; Potassium 4.9; Sodium 139 01/24/2021: TSH 1.34   Recent Lipid Panel Lab Results  Component Value Date/Time   CHOL 96 01/20/2021 07:43 AM   CHOL 123 06/25/2017 07:34 AM   TRIG 91.0 01/20/2021 07:43 AM   HDL 34.30 (L) 01/20/2021 07:43 AM   HDL 28 (L) 06/25/2017 07:34 AM   LDLCALC 43 01/20/2021 07:43 AM   LDLCALC 64 06/25/2017 07:34 AM  Risk Assessment/Calculations:      Physical Exam:    VS:  BP (!) 142/62   Pulse 71   Ht 5' 4.5" (1.638 m)   Wt 172 lb (78 kg)   SpO2 96%   BMI 29.07 kg/m     Wt Readings from Last 3 Encounters:  03/31/21 172 lb (78 kg)  01/24/21 173 lb 2 oz (78.5 kg)  09/24/20 177 lb (80.3 kg)     Constitutional:      Appearance: Healthy appearance. Not in distress.  Neck:     Vascular: JVD normal.  Pulmonary:     Effort: Pulmonary effort is normal.     Breath sounds: No wheezing. No rales.  Cardiovascular:     Normal rate. Regular rhythm. Normal S1. Normal S2.      Murmurs: There is no murmur.  Edema:    Peripheral edema absent.  Abdominal:     Palpations: Abdomen is soft.  Skin:    General: Skin is warm and dry.  Neurological:     Mental Status: Alert and oriented to person, place and time.     Cranial Nerves: Cranial nerves are intact.         ASSESSMENT & PLAN:    CAD with angina History of non-STEMI in 2013 treated with DES to the LAD.  She had moderate nonobstructive disease elsewhere.  Nuclear stress test in 2019 was low risk.  She has had left arm and chest heaviness.  This is nonexertional and not worsened by exertion.  It is not reminiscent of her previous angina.  Her electrocardiogram does not demonstrate any acute changes.  Therefore, I think it is reasonable to proceed with medical therapy and stress testing initially.  We reviewed this in she agrees to proceed.  Continue current dose of carvedilol, rosuvastatin, clopidogrel.  Start isosorbide 15 mg daily.  Obtain Lexiscan Myoview.  Follow-up with me in 4  to 6 weeks.  Hypertension Blood pressure above target.  Add isosorbide as noted.  Continue current dose of hydrochlorothiazide, carvedilol, lisinopril.  Hyperlipidemia LDL optimal on most recent lab work.  Continue current Rx.    COPD, tobacco abuse I have again advised her to quit smoking.    Shared Decision Making/Informed Consent The risks [chest pain, shortness of breath, cardiac arrhythmias, dizziness, blood pressure fluctuations, myocardial infarction, stroke/transient ischemic attack, nausea, vomiting, allergic reaction, radiation exposure, metallic taste sensation and life-threatening complications (estimated to be 1 in 10,000)], benefits (risk stratification, diagnosing coronary artery disease, treatment guidance) and alternatives of a nuclear stress test were discussed in detail with Ms. Hauter and she agrees to proceed.    Dispo:  Return in about 6 weeks (around 05/12/2021) for Routine follow up in 6 weeks with Richardson Dopp, PA-C..   Medication Adjustments/Labs and Tests Ordered: Current medicines are reviewed at length with the patient today.  Concerns regarding medicines are outlined above.  Tests Ordered: Orders Placed This Encounter  Procedures   Cardiac Stress Test: Informed Consent Details: Physician/Practitioner Attestation; Transcribe to consent form and obtain patient signature   Myocardial Perfusion Imaging   EKG 12-Lead   Medication Changes: Meds ordered this encounter  Medications   DISCONTD: isosorbide mononitrate (IMDUR) 30 MG 24 hr tablet    Sig: Take 0.5 tablets (15 mg total) by mouth daily.    Dispense:  45 tablet    Refill:  3   isosorbide mononitrate (IMDUR) 30 MG 24 hr tablet    Sig: Take 0.5 tablets (15 mg total) by  mouth daily.    Dispense:  45 tablet    Refill:  3    Signed, Richardson Dopp, PA-C  03/31/2021 6:09 PM    [facility] [address], [place], [place]  [address] Phone: [phone]; Fax: [phone]

## 2021-03-31 ENCOUNTER — Encounter: Payer: Self-pay | Admitting: Physician Assistant

## 2021-03-31 ENCOUNTER — Ambulatory Visit (INDEPENDENT_AMBULATORY_CARE_PROVIDER_SITE_OTHER): Payer: BC Managed Care – PPO | Admitting: Physician Assistant

## 2021-03-31 ENCOUNTER — Other Ambulatory Visit: Payer: Self-pay

## 2021-03-31 VITALS — BP 142/62 | HR 71 | Ht 64.5 in | Wt 172.0 lb

## 2021-03-31 DIAGNOSIS — R079 Chest pain, unspecified: Secondary | ICD-10-CM

## 2021-03-31 DIAGNOSIS — I25119 Atherosclerotic heart disease of native coronary artery with unspecified angina pectoris: Secondary | ICD-10-CM | POA: Diagnosis not present

## 2021-03-31 DIAGNOSIS — I10 Essential (primary) hypertension: Secondary | ICD-10-CM | POA: Diagnosis not present

## 2021-03-31 DIAGNOSIS — J449 Chronic obstructive pulmonary disease, unspecified: Secondary | ICD-10-CM | POA: Diagnosis not present

## 2021-03-31 DIAGNOSIS — Z720 Tobacco use: Secondary | ICD-10-CM

## 2021-03-31 DIAGNOSIS — E785 Hyperlipidemia, unspecified: Secondary | ICD-10-CM

## 2021-03-31 DIAGNOSIS — R0602 Shortness of breath: Secondary | ICD-10-CM

## 2021-03-31 DIAGNOSIS — I2510 Atherosclerotic heart disease of native coronary artery without angina pectoris: Secondary | ICD-10-CM

## 2021-03-31 MED ORDER — ISOSORBIDE MONONITRATE ER 30 MG PO TB24: 15.0000 mg | ORAL_TABLET | Freq: Every day | ORAL | 3 refills | Status: DC

## 2021-03-31 NOTE — Progress Notes (Deleted)
She is here alone. Unfortunately, her father passed away in 02-24-2023. Over the past couple weeks, she has noted some left arm heaviness as well as chest heaviness. This does not seem to be related to exertion. It is not worsened by exertion. She has chronic shortness of breath with exertion. This seems to gradually be getting worse. She continues to smoke cigarettes. She has not had syncope, orthopnea, leg edema.  EKG: NSR, HR 71, normal axis, nonspecific ST-T wave changes, low voltage, QTC 408, no change from prior tracing  CAD with angina History of non-STEMI in 2013 treated with DES to the LAD. She had moderate nonobstructive disease elsewhere. Nuclear stress test in 2019 was low risk. She has had left arm and chest heaviness. This is nonexertional and not worsened by exertion. It is not reminiscent of her previous angina. Her electrocardiogram does not demonstrate any acute changes. Therefore, I think it is reasonable to proceed with medical therapy and stress testing initially. We reviewed this in she agrees to proceed. Continue current dose of carvedilol, rosuvastatin, clopidogrel. Start isosorbide 15 mg daily. Obtain Lexiscan Myoview. Follow-up with me in 4 to 6 weeks.  Hypertension Blood pressure above target. Add isosorbide as noted. Continue current dose of hydrochlorothiazide, carvedilol, lisinopril.  Hyperlipidemia lipids  COPD, tobacco abuse I have again advised her to quit smoking.

## 2021-03-31 NOTE — Telephone Encounter (Signed)
See office note from 03/31/2021 Richardson Dopp, PA-C    03/31/2021 11:25 AM

## 2021-03-31 NOTE — Patient Instructions (Signed)
Medication Instructions:   START IMDUR one half tablet by mouth ( 15 mg) daily.   *If you need a refill on your cardiac medications before your next appointment, please call your pharmacy*   Lab Work:  -NONE  If you have labs (blood work) drawn today and your tests are completely normal, you will receive your results only by: [place] (if you have MyChart) OR A paper copy in the mail If you have any lab test that is abnormal or we need to change your treatment, we will call you to review the results.   Testing/Procedures: You are scheduled for a Myocardial Perfusion Imaging Study on Wednesday, July 27 at 7:15 am.   Please arrive 15 minutes prior to your appointment time for registration and insurance purposes.   The test will take approximately 3 to 4 hours to complete; you may bring reading material. If someone comes with you to your appointment, they will need to remain in the main lobby due to limited space in the testing area.   How to prepare for your Myocardial Perfusion test:   Do not eat or drink 3 hours prior to your test, except you may have water.    Do not consume products containing caffeine (regular or decaffeinated) 12 hours prior to your test (ex: coffee, chocolate, soda, tea)   Do bring a list of your current medications with you. If not listed below, you may take your medications as normal.    Bring any held medication to your appointment, as you may be required to take it once the test is complete.   Do wear comfortable clothes (no dresses or overalls) and walking shoes. Tennis shoes are preferred. No heels or open toed shoes.  Do not wear perfume or lotions (deodorant is allowed).   If these instructions are not followed, you test will have to be rescheduled.   Please report to [address] for your test. If you have questions or concerns about your appointment, please call the Nuclear Lab at [phone].  If you cannot keep  your appointment, please provide 24 hour notification to the Nuclear lab to avoid a possible $50 charge to your account.       Follow-Up: At [facility], you and your health needs are our priority.  As part of our continuing mission to provide you with exceptional heart care, we have created designated Provider Care Teams.  These Care Teams include your primary Cardiologist (physician) and Advanced Practice Providers (APPs -  Physician Assistants and Nurse Practitioners) who all work together to provide you with the care you need, when you need it.  We recommend signing up for the patient portal called "MyChart".  Sign up information is provided on this After Visit Summary.  MyChart is used to connect with patients for Virtual Visits (Telemedicine).  Patients are able to view lab/test results, encounter notes, upcoming appointments, etc.  Non-urgent messages can be sent to your provider as well.   To learn more about what you can do with MyChart, go to [email or web address].    Your next appointment:   6 week(s) with Richardson Dopp, PA-C on Friday, September 2 @ 8:45 am.   The format for your next appointment:   In Person  Provider:   Richardson Dopp, PA-C   Other Instructions

## 2021-04-03 ENCOUNTER — Telehealth (HOSPITAL_COMMUNITY): Payer: Self-pay | Admitting: *Deleted

## 2021-04-03 NOTE — Telephone Encounter (Signed)
Patient given detailed instructions per Myocardial Perfusion Study Information Sheet for the test on 04/09/21 Patient notified to arrive 15 minutes early and that it is imperative to arrive on time for appointment to keep from having the test rescheduled.  If you need to cancel or reschedule your appointment, please call the office within 24 hours of your appointment. . Patient verbalized understanding. Karen Osborne   

## 2021-04-09 ENCOUNTER — Other Ambulatory Visit: Payer: Self-pay

## 2021-04-09 ENCOUNTER — Ambulatory Visit (HOSPITAL_COMMUNITY): Payer: BC Managed Care – PPO | Attending: Cardiology

## 2021-04-09 DIAGNOSIS — R079 Chest pain, unspecified: Secondary | ICD-10-CM | POA: Insufficient documentation

## 2021-04-09 LAB — MYOCARDIAL PERFUSION IMAGING
LV dias vol: 55 mL (ref 46–106)
LV sys vol: 17 mL
Peak HR: 82 {beats}/min
Rest HR: 73 {beats}/min
SDS: 3
SRS: 0
SSS: 3
TID: 1.09

## 2021-04-09 MED ORDER — TECHNETIUM TC 99M TETROFOSMIN IV KIT
32.6000 | PACK | Freq: Once | INTRAVENOUS | Status: AC | PRN
  Administered 2021-04-09: 32.6 via INTRAVENOUS
  Filled 2021-04-09: qty 33

## 2021-04-09 MED ORDER — TECHNETIUM TC 99M TETROFOSMIN IV KIT
10.8000 | PACK | Freq: Once | INTRAVENOUS | Status: AC | PRN
Start: 2021-04-09 — End: 2021-04-09
  Administered 2021-04-09: 10.8 via INTRAVENOUS
  Filled 2021-04-09: qty 11

## 2021-04-09 MED ORDER — TECHNETIUM TC 99M TETROFOSMIN IV KIT
10.7000 | PACK | Freq: Once | INTRAVENOUS | Status: DC | PRN
  Filled 2021-04-09: qty 11

## 2021-04-09 MED ORDER — REGADENOSON 0.4 MG/5ML IV SOLN
0.4000 mg | Freq: Once | INTRAVENOUS | Status: AC
  Administered 2021-04-09: 0.4 mg via INTRAVENOUS

## 2021-04-10 ENCOUNTER — Encounter: Payer: Self-pay | Admitting: Physician Assistant

## 2021-04-14 ENCOUNTER — Telehealth: Payer: Self-pay | Admitting: *Deleted

## 2021-04-14 DIAGNOSIS — I10 Essential (primary) hypertension: Secondary | ICD-10-CM

## 2021-04-14 DIAGNOSIS — I2510 Atherosclerotic heart disease of native coronary artery without angina pectoris: Secondary | ICD-10-CM

## 2021-04-14 MED ORDER — CARVEDILOL 6.25 MG PO TABS: 9.7500 mg | ORAL_TABLET | Freq: Two times a day (BID) | ORAL | 2 refills | Status: DC

## 2021-04-14 NOTE — Telephone Encounter (Signed)
DC Isosorbide Increase Carvedilol to 9.375 mg twice daily  Richardson Dopp, PA-C    04/14/2021 2:26 PM

## 2021-04-14 NOTE — Telephone Encounter (Signed)
S/w pt is agreeable to treatment plan. Pt will D/C imdur, pt will increase carvedilol one and one half tablet by mouth ( 9.75 mg) bid. Medication list updated.

## 2021-05-05 ENCOUNTER — Ambulatory Visit: Payer: BC Managed Care – PPO | Admitting: Family Medicine

## 2021-05-05 ENCOUNTER — Encounter: Payer: Self-pay | Admitting: Family Medicine

## 2021-05-05 ENCOUNTER — Other Ambulatory Visit: Payer: Self-pay

## 2021-05-05 VITALS — BP 102/64 | HR 75 | Temp 97.3°F | Ht 64.5 in | Wt 172.0 lb

## 2021-05-05 DIAGNOSIS — M79641 Pain in right hand: Secondary | ICD-10-CM | POA: Diagnosis not present

## 2021-05-05 DIAGNOSIS — R1110 Vomiting, unspecified: Secondary | ICD-10-CM | POA: Diagnosis not present

## 2021-05-05 DIAGNOSIS — F17200 Nicotine dependence, unspecified, uncomplicated: Secondary | ICD-10-CM | POA: Diagnosis not present

## 2021-05-05 DIAGNOSIS — I10 Essential (primary) hypertension: Secondary | ICD-10-CM | POA: Diagnosis not present

## 2021-05-05 MED ORDER — LOSARTAN POTASSIUM 50 MG PO TABS: 50.0000 mg | ORAL_TABLET | Freq: Every day | ORAL | 1 refills | Status: DC

## 2021-05-05 NOTE — Assessment & Plan Note (Signed)
Describes discomfort to R 2nd-5th MCPs. rec supportive care with tylenol, voltaren gel, update if ongoing to consider eval for rheumatoid arthritis with labs and imaging.

## 2021-05-05 NOTE — Assessment & Plan Note (Signed)
Continue to encourage cessation. 

## 2021-05-05 NOTE — Patient Instructions (Signed)
For knuckle pain ?wear and tear arthritis. Try tylenol and topical voltaren anti inflammatory gel as needed. Let us know if ongoing.  For throat symptoms - possible side effect to lisinopril - stop lisinopril and trial in its place losartan. If no better with this after a few weeks, let me know for ENT referral.

## 2021-05-05 NOTE — Progress Notes (Signed)
Patient ID: Karen Osborne, female    DOB: 04/30/1956, 65 y.o.   MRN: [id number]  This visit was conducted in person.  BP 102/64   Pulse 75   Temp (!) 97.3 F (36.3 C) (Temporal)   Ht 5' 4.5" (1.638 m)   Wt 172 lb (78 kg)   SpO2 100%   BMI 29.07 kg/m   BP Readings from Last 3 Encounters:  05/05/21 102/64  03/31/21 (!) 142/62  01/24/21 136/62    CC: cough and emesis Subjective:   HPI: ARCHER VISE is a 65 y.o. female presenting on 05/05/2021 for Emesis (Vomiting at night (dry heaving sometimes) starts with a cough. )   Several month h/o night time symptoms only of dry sensation to back of throat associated with cough and post-tussive dry heaving leading to emesis. This happens every night. Feels she has to clear her throat but no globus sensation. No dysphagia. No unexpected weight loss, fevers/chills, early satiety, abd or chest pain, nausea, diarrhea, blood in stool. Occasional constipation   Also noticing hand pain at R knuckles for a few weeks, worse with a fist, manages with tylenol PRN. Mother with h/o arthritis, unsure if rheumatoid.   Recent prescription imdur caused heavy chest pressure and upper back pain - so this was discontinued and instead carvedilol was increased to 9.339m bid.   Lab Results  Component Value Date   HGBA1C 6.2 01/20/2021     Continued smoker 1/2 ppd. Continues with plans to quit.      Relevant past medical, surgical, family and social history reviewed and updated as indicated. Interim medical history since our last visit reviewed. Allergies and medications reviewed and updated. Outpatient Medications Prior to Visit  Medication Sig Dispense Refill   Blood Glucose Monitoring Suppl (ONE TOUCH ULTRA 2) w/Device KIT 1 each by Does not apply route daily. Use as directed to check sugar once a day 1 each 0   carvedilol (COREG) 6.25 MG tablet Take 1.5 tablets (9.375 mg total) by mouth 2 (two) times daily with a meal. 180 tablet 2   Cholecalciferol  (VITAMIN D) 125 MCG (5000 UT) CAPS Take 1 capsule by mouth every Monday, Wednesday, and Friday.     clopidogrel (PLAVIX) 75 MG tablet TAKE 1 TABLET BY MOUTH EVERY DAY 90 tablet 3   Cyanocobalamin (B-12) 1000 MCG SUBL Place 1 tablet under the tongue every Monday, Wednesday, and Friday.     glucose blood (ONE TOUCH ULTRA TEST) test strip 1 each by Other route daily. Use as instructed to check sugar once a day. 100 each 0   hydrochlorothiazide (MICROZIDE) 12.5 MG capsule Take 1 capsule (12.5 mg total) by mouth daily. 90 capsule 2   Ibuprofen-Diphenhydramine HCl (ADVIL PM) 200-25 MG CAPS Take 1 capsule by mouth at bedtime as needed.     metFORMIN (GLUCOPHAGE-XR) 500 MG 24 hr tablet Take 2 tablets (1,000 mg total) by mouth daily. 180 tablet 3   nitroGLYCERIN (NITROSTAT) 0.4 MG SL tablet Place 1 tablet (0.4 mg total) under the tongue every 5 (five) minutes as needed for chest pain (up to 3 doses). 25 tablet 3   ONETOUCH DELICA LANCETS 350PMISC 1 each by Does not apply route daily. Use as directed to check sugar once a day. 100 each 0   rOPINIRole (REQUIP) 1 MG tablet Take 1-2 tablets (1-2 mg total) by mouth at bedtime. 180 tablet 2   rosuvastatin (CRESTOR) 40 MG tablet TAKE 1 TABLET BY MOUTH EVERY DAY  90 tablet 3   lisinopril (ZESTRIL) 20 MG tablet TAKE 1 TABLET BY MOUTH EVERY DAY 90 tablet 3   No facility-administered medications prior to visit.     Per HPI unless specifically indicated in ROS section below Review of Systems  Objective:  BP 102/64   Pulse 75   Temp (!) 97.3 F (36.3 C) (Temporal)   Ht 5' 4.5" (1.638 m)   Wt 172 lb (78 kg)   SpO2 100%   BMI 29.07 kg/m   Wt Readings from Last 3 Encounters:  05/05/21 172 lb (78 kg)  03/31/21 172 lb (78 kg)  01/24/21 173 lb 2 oz (78.5 kg)      Physical Exam Vitals and nursing note reviewed.  Constitutional:      Appearance: Normal appearance. She is not ill-appearing.  HENT:     Head: Normocephalic and atraumatic.     Mouth/Throat:      Mouth: Mucous membranes are moist.     Pharynx: Oropharynx is clear. No oropharyngeal exudate or posterior oropharyngeal erythema.  Eyes:     Extraocular Movements: Extraocular movements intact.     Pupils: Pupils are equal, round, and reactive to light.  Neck:     Thyroid: No thyroid mass or thyromegaly.     Vascular: No carotid bruit.  Cardiovascular:     Rate and Rhythm: Normal rate and regular rhythm.     Pulses: Normal pulses.     Heart sounds: Normal heart sounds. No murmur heard. Pulmonary:     Effort: Pulmonary effort is normal. No respiratory distress.     Breath sounds: Normal breath sounds. No wheezing, rhonchi or rales.  Musculoskeletal:        General: No swelling or tenderness. Normal range of motion.     Cervical back: Normal range of motion and neck supple. No rigidity.     Right lower leg: No edema.     Left lower leg: No edema.     Comments:  2+ radial pulses bilaterally No discomfort to palpation of MCPs or IPs bilaterally, no active synovitis Grip strength intact  Lymphadenopathy:     Cervical: No cervical adenopathy.  Skin:    General: Skin is warm and dry.     Findings: No rash.  Neurological:     Mental Status: She is alert.  Psychiatric:        Mood and Affect: Mood normal.        Behavior: Behavior normal.      Results for orders placed or performed in visit on 04/09/21  Myocardial Perfusion Imaging  Result Value Ref Range   Rest HR 73 bpm   Rest BP 137/59 mmHg   Peak HR 82 bpm   Peak BP 145/71 mmHg   SSS 3    SRS 0    SDS 3    TID 1.09    LV sys vol 17 mL   LV dias vol 55 46 - 106 mL    Assessment & Plan:  This visit occurred during the SARS-CoV-2 public health emergency.  Safety protocols were in place, including screening questions prior to the visit, additional usage of staff PPE, and extensive cleaning of exam room while observing appropriate contact time as indicated for disinfecting solutions.   Problem List Items Addressed This  Visit     TOBACCO ABUSE    Continue to encourage cessation.       Essential hypertension    See below regarding change from ACEI to ARB.  Relevant Medications   losartan (COZAAR) 50 MG tablet   Right hand pain    Describes discomfort to R 2nd-5th MCPs. rec supportive care with tylenol, voltaren gel, update if ongoing to consider eval for rheumatoid arthritis with labs and imaging.       Post-tussive emesis - Primary    No red flags of dysphagia, early satiety, weight loss. Anticipate ACEI related effect given associated tickle to the back of the throat, cough. Will trial ARB in place of lisinopril.  If no benefit, discussed referral to ENT as next step for direct visualization in ongoing smoker. Pt agrees with plan.         Meds ordered this encounter  Medications   losartan (COZAAR) 50 MG tablet    Sig: Take 1 tablet (50 mg total) by mouth daily.    Dispense:  90 tablet    Refill:  1    To replace lisinopril    No orders of the defined types were placed in this encounter.   Patient Instructions  For knuckle pain ?wear and tear arthritis. Try tylenol and topical voltaren anti inflammatory gel as needed. Let us know if ongoing.  For throat symptoms - possible side effect to lisinopril - stop lisinopril and trial in its place losartan. If no better with this after a few weeks, let me know for ENT referral.   Follow up plan: Return if symptoms worsen or fail to improve.  [name], MD

## 2021-05-05 NOTE — Assessment & Plan Note (Signed)
See below regarding change from ACEI to ARB.

## 2021-05-05 NOTE — Assessment & Plan Note (Signed)
No red flags of dysphagia, early satiety, weight loss. Anticipate ACEI related effect given associated tickle to the back of the throat, cough. Will trial ARB in place of lisinopril.  If no benefit, discussed referral to ENT as next step for direct visualization in ongoing smoker. Pt agrees with plan.

## 2021-05-06 ENCOUNTER — Ambulatory Visit: Payer: BC Managed Care – PPO | Admitting: Obstetrics and Gynecology

## 2021-05-14 ENCOUNTER — Encounter: Payer: Self-pay | Admitting: Pulmonary Disease

## 2021-05-14 ENCOUNTER — Ambulatory Visit: Payer: BC Managed Care – PPO | Admitting: Pulmonary Disease

## 2021-05-14 ENCOUNTER — Other Ambulatory Visit: Payer: Self-pay

## 2021-05-14 ENCOUNTER — Telehealth: Payer: Self-pay

## 2021-05-14 VITALS — BP 138/60 | HR 80 | Temp 97.6°F | Ht 64.5 in | Wt 172.2 lb

## 2021-05-14 DIAGNOSIS — R0683 Snoring: Secondary | ICD-10-CM | POA: Diagnosis not present

## 2021-05-14 DIAGNOSIS — Z716 Tobacco abuse counseling: Secondary | ICD-10-CM

## 2021-05-14 DIAGNOSIS — J449 Chronic obstructive pulmonary disease, unspecified: Secondary | ICD-10-CM | POA: Diagnosis not present

## 2021-05-14 MED ORDER — ALBUTEROL SULFATE HFA 108 (90 BASE) MCG/ACT IN AERS: 2.0000 | INHALATION_SPRAY | Freq: Four times a day (QID) | RESPIRATORY_TRACT | 6 refills | Status: DC | PRN

## 2021-05-14 MED ORDER — TRELEGY ELLIPTA 100-62.5-25 MCG/INH IN AEPB: 1.0000 | INHALATION_SPRAY | Freq: Every day | RESPIRATORY_TRACT | 5 refills | Status: DC

## 2021-05-14 MED ORDER — NICOTINE 14 MG/24HR TD PT24: 14.0000 mg | MEDICATED_PATCH | Freq: Every day | TRANSDERMAL | 0 refills | Status: DC

## 2021-05-14 MED ORDER — VARENICLINE TARTRATE 0.5 MG X 11 & 1 MG X 42 PO MISC: ORAL | 0 refills | Status: DC

## 2021-05-14 NOTE — Patient Instructions (Signed)
Trelegy one puff daily, and rinse your mouth after each use.  Albuterol two puffs every 6 hours as needed for cough, wheeze, chest congestion, or shortness of breath.  Chantix >> 0.5 mg daily for 3 days, then 0.5 mg twice daily for next 4 days.  Set your quit date to stop smoking after you have used chantix for 7 days, and then uses chantix 1 mg twice per day.  Will arrange for pulmonary function test and home sleep study.  Follow up in 6 weeks.

## 2021-05-14 NOTE — Progress Notes (Signed)
[facility]  Chief Complaint  Patient presents with   Follow-up    C/o sob with exertion, wheezing and prod cough with clear sputum.     Constitutional:  BP 138/60 (BP Location: Left Arm, Cuff Size: Normal)   Pulse 80   Temp 97.6 F (36.4 C) (Temporal)   Ht 5' 4.5" (1.638 m)   Wt 172 lb 3.2 oz (78.1 kg)   SpO2 98%   BMI 29.10 kg/m   Past Medical History:  CAD, Depression, DM type 2, HLD, HTN, Osteopenia, Osteoporosis, RLS  Past Surgical History:  She  has a past surgical history that includes gyn surgery (02/1998); Abdominal hysterectomy; Coronary angioplasty with stent (01/27/2012); left heart catheterization with coronary angiogram (N/A, 01/27/2012); percutaneous coronary stent intervention (pci-s) (01/27/2012); Colonoscopy (10/2006); and Colonoscopy with propofol (N/A, 08/19/2018).  Brief Summary:  Karen Osborne is a 65 y.o. female smoker with COPD from emphysema and snoring.       Subjective:   Previously seen by Dr. Ashby Dawes.  She has smoked for years.  Was smoking up to 2 ppd, but has gradually decreased to 0.5 ppd.  Unfortunately she has struggled to go below this level.  Tried nicotine patch before.  Hasn't tried any medications for smoking cessation.  She struggles with activities because of feeling short of breath.  She has trouble carrying things such as grocery bags.  She has cough with clear sputum, and intermittently has wheezing.  She was tried on spiriva and breo several years ago but stopped because at that time she didn't feel they were effective.  However, her breathing have gotten worse since then.  She has trouble with her sleep.  She is restless at night and snores.  She wakes up feeling choked and anxious.  She feels tired during the day.  She is from [place].  She works in [employer].  No history of pneumonia or tuberculosis.  No animal/bird exposures.  Her father recently passed away from COPD with  emphysema.  She is enrolled in lung cancer screening.  Scan from October 2021 showed emphysema and small nodules.  Physical Exam:   Appearance - well kempt   ENMT - no sinus tenderness, no oral exudate, no LAN, Mallampati 3 airway, no stridor, wears dentures, raspy voice  Respiratory - equal breath sounds bilaterally, no wheezing or rales  CV - s1s2 regular rate and rhythm, no murmurs  Ext - no clubbing, no edema  Skin - no rashes  Psych - normal mood and affect   Pulmonary testing:  A1AT 04/06/18 >> 144, MM  Chest Imaging:  LDCT chest 07/07/20 >> atherosclerosis, small HH, small nodules b/l up to 3.8 mm, diffuse bronchial wall thickening with mild centrilobular and paraseptal emphysema  Sleep Tests:    Cardiac Tests:  Echo 03/16/17 >> EF 60 to 65%, mild LVH, grade 2 DD  Social History:  She  reports that she has been smoking cigarettes. She has a 96.80 pack-year smoking history. She has never used smokeless tobacco. She reports that she does not drink alcohol and does not use drugs.  Family History:  Her family history includes Diabetes in her sister; Heart attack in her father; Heart disease (age of onset: 58) in her father; Hypertension in her father; Kidney cancer in her father; Ovarian cancer (age of onset: 75) in her maternal aunt; Prostate cancer in her father.     Assessment/Plan:   COPD with emphysema. - will have her try  trelegy 100 one puff daily - prn albuterol HFA two puffs q6h prn - will arrange for pulmonary function test  Tobacco abuse. - she is agreeable to try chantix with nicotine replacement - reviewed dosing schedule, setting quit date and side effect to monitor for with chantix - explained she can use nicotine replacement simultaneously with chantix - expected duration of therapy for chantix is 3 to 6 months  Snoring. - associated with sleep disruption, apnea, and daytime sleepiness - she has history of coronary artery disease, hypertension,  and depression - will arrange for home sleep study to assess whether she could have obstructive sleep apnea  Lung cancer screening. - she will have follow up low dose CT chest in October 2022  Coronary artery disease, chronic diastolic CHF. - followed by Dr. Sherren Mocha with [facility]  Time Spent Involved in Patient Care on Day of Examination:  47 minutes  Follow up:   Patient Instructions  Trelegy one puff daily, and rinse your mouth after each use.  Albuterol two puffs every 6 hours as needed for cough, wheeze, chest congestion, or shortness of breath.  Chantix >> 0.5 mg daily for 3 days, then 0.5 mg twice daily for next 4 days.  Set your quit date to stop smoking after you have used chantix for 7 days, and then uses chantix 1 mg twice per day.  Will arrange for pulmonary function test and home sleep study.  Follow up in 6 weeks.  Medication List:   Allergies as of 05/14/2021       Reactions   Penicillins Rash        Medication List        Accurate as of May 14, 2021 10:33 AM. If you have any questions, ask your nurse or doctor.          albuterol 108 (90 Base) MCG/ACT inhaler Commonly known as: VENTOLIN HFA Inhale 2 puffs into the lungs every 6 (six) hours as needed for wheezing or shortness of breath. Started by: Chesley Mires, MD   B-12 1000 MCG Subl Place 1 tablet under the tongue every Monday, Wednesday, and Friday.   carvedilol 6.25 MG tablet Commonly known as: COREG Take 1.5 tablets (9.375 mg total) by mouth 2 (two) times daily with a meal.   clopidogrel 75 MG tablet Commonly known as: PLAVIX TAKE 1 TABLET BY MOUTH EVERY DAY   glucose blood test strip Commonly known as: ONE TOUCH ULTRA TEST 1 each by Other route daily. Use as instructed to check sugar once a day.   hydrochlorothiazide 12.5 MG capsule Commonly known as: MICROZIDE Take 1 capsule (12.5 mg total) by mouth daily.   Ibuprofen-diphenhydrAMINE HCl 200-25 MG Caps Commonly  known as: Advil PM Take 1 capsule by mouth at bedtime as needed.   losartan 50 MG tablet Commonly known as: COZAAR Take 1 tablet (50 mg total) by mouth daily.   metFORMIN 500 MG 24 hr tablet Commonly known as: GLUCOPHAGE-XR Take 2 tablets (1,000 mg total) by mouth daily.   nicotine 14 mg/24hr patch Commonly known as: NICODERM CQ - dosed in mg/24 hours Place 1 patch (14 mg total) onto the skin daily. Started by: Chesley Mires, MD   nitroGLYCERIN 0.4 MG SL tablet Commonly known as: Nitrostat Place 1 tablet (0.4 mg total) under the tongue every 5 (five) minutes as needed for chest pain (up to 3 doses).   ONE TOUCH ULTRA 2 w/Device Kit 1 each by Does not apply route daily. Use  as directed to check sugar once a day   OneTouch Delica Lancets 46N Misc 1 each by Does not apply route daily. Use as directed to check sugar once a day.   rOPINIRole 1 MG tablet Commonly known as: REQUIP Take 1-2 tablets (1-2 mg total) by mouth at bedtime.   rosuvastatin 40 MG tablet Commonly known as: CRESTOR TAKE 1 TABLET BY MOUTH EVERY DAY   Trelegy Ellipta 100-62.5-25 MCG/INH Aepb Generic drug: Fluticasone-Umeclidin-Vilant Inhale 1 puff into the lungs daily. Started by: Chesley Mires, MD   varenicline 0.5 MG X 11 & 1 MG X 42 tablet Commonly known as: CHANTIX PAK Take one 0.5 mg tablet by mouth once daily for 3 days, then increase to one 0.5 mg tablet twice daily for 4 days, then increase to one 1 mg tablet twice daily. Started by: Chesley Mires, MD   Vitamin D 125 MCG (5000 UT) Caps Take 1 capsule by mouth every Monday, Wednesday, and Friday.        Signature:  Chesley Mires, MD [facility] Pager - [phone] 05/14/2021, 10:33 AM

## 2021-05-14 NOTE — Telephone Encounter (Signed)
PA request was received from (pharmacy): [facility]  [id number] Fax: [phone] Medication name and strength: Vareniciline Tartrate Ordering Provider: Chesley Mires (MD)  Was PA started with [facility]?: Yes If yes, please enter KEY: [id number] Medication tried and failed: Trelegy,Ventolin,BreoEllipta,Proventil, Spiriva    PA sent to plan, time frame for approval / denial: waiting for approval. Routing to [place] triage for follow-up

## 2021-05-15 NOTE — Telephone Encounter (Addendum)
Called and spoke to pharmacy to let them know that PA was approved. Nothing further needed.

## 2021-05-16 ENCOUNTER — Ambulatory Visit: Payer: BC Managed Care – PPO | Admitting: Physician Assistant

## 2021-05-16 ENCOUNTER — Telehealth: Payer: Self-pay

## 2021-05-16 NOTE — Telephone Encounter (Signed)
Called and spoke to patient about upcoming covid test, nothing further needed.

## 2021-05-20 ENCOUNTER — Other Ambulatory Visit
Admission: RE | Admit: 2021-05-20 | Discharge: 2021-05-20 | Disposition: A | Discharge status: Home / Self Care | Payer: BC Managed Care – PPO | Source: Ambulatory Visit | Attending: Pulmonary Disease | Admitting: Pulmonary Disease

## 2021-05-20 ENCOUNTER — Other Ambulatory Visit: Payer: Self-pay

## 2021-05-20 DIAGNOSIS — Z20822 Contact with and (suspected) exposure to covid-19: Secondary | ICD-10-CM | POA: Insufficient documentation

## 2021-05-20 DIAGNOSIS — Z01812 Encounter for preprocedural laboratory examination: Secondary | ICD-10-CM | POA: Insufficient documentation

## 2021-05-20 LAB — SARS CORONAVIRUS 2 (TAT 6-24 HRS): SARS Coronavirus 2: NEGATIVE

## 2021-05-21 ENCOUNTER — Ambulatory Visit: Payer: BC Managed Care – PPO | Attending: Pulmonary Disease

## 2021-05-21 DIAGNOSIS — J449 Chronic obstructive pulmonary disease, unspecified: Secondary | ICD-10-CM | POA: Diagnosis not present

## 2021-05-21 MED ORDER — ALBUTEROL SULFATE (2.5 MG/3ML) 0.083% IN NEBU
2.5000 mg | INHALATION_SOLUTION | Freq: Once | RESPIRATORY_TRACT | Status: AC
  Administered 2021-05-21: 2.5 mg via RESPIRATORY_TRACT
  Filled 2021-05-21: qty 3

## 2021-05-26 LAB — PULMONARY FUNCTION TEST ARMC ONLY
DL/VA % pred: 82 %
DL/VA: 3.43 ml/min/mmHg/L
DLCO unc % pred: 73 %
DLCO unc: 14.55 ml/min/mmHg
FEF 25-75 Post: 1.5 L/sec
FEF 25-75 Pre: 1.35 L/sec
FEF2575-%Change-Post: 11 %
FEF2575-%Pred-Post: 68 %
FEF2575-%Pred-Pre: 61 %
FEV1-%Change-Post: 3 %
FEV1-%Pred-Post: 72 %
FEV1-%Pred-Pre: 70 %
FEV1-Post: 1.78 L
FEV1-Pre: 1.71 L
FEV1FVC-%Change-Post: 4 %
FEV1FVC-%Pred-Pre: 93 %
FEV6-%Change-Post: 0 %
FEV6-%Pred-Post: 76 %
FEV6-%Pred-Pre: 77 %
FEV6-Post: 2.35 L
FEV6-Pre: 2.37 L
FEV6FVC-%Pred-Post: 104 %
FEV6FVC-%Pred-Pre: 104 %
FVC-%Change-Post: 0 %
FVC-%Pred-Post: 73 %
FVC-%Pred-Pre: 74 %
FVC-Post: 2.35 L
FVC-Pre: 2.37 L
Post FEV1/FVC ratio: 76 %
Post FEV6/FVC ratio: 100 %
Pre FEV1/FVC ratio: 73 %
Pre FEV6/FVC Ratio: 100 %
RV % pred: 93 %
RV: 1.93 L
TLC % pred: 86 %
TLC: 4.35 L

## 2021-06-27 ENCOUNTER — Other Ambulatory Visit: Payer: Self-pay

## 2021-06-27 ENCOUNTER — Ambulatory Visit: Payer: BC Managed Care – PPO

## 2021-06-27 DIAGNOSIS — G4733 Obstructive sleep apnea (adult) (pediatric): Secondary | ICD-10-CM | POA: Diagnosis not present

## 2021-06-27 DIAGNOSIS — R0683 Snoring: Secondary | ICD-10-CM

## 2021-07-03 ENCOUNTER — Other Ambulatory Visit: Payer: Self-pay

## 2021-07-03 ENCOUNTER — Ambulatory Visit: Payer: BC Managed Care – PPO | Admitting: Pulmonary Disease

## 2021-07-03 ENCOUNTER — Encounter: Payer: Self-pay | Admitting: Pulmonary Disease

## 2021-07-03 ENCOUNTER — Telehealth: Payer: Self-pay

## 2021-07-03 VITALS — BP 122/66 | HR 77 | Temp 97.6°F | Ht 66.0 in | Wt 176.2 lb

## 2021-07-03 DIAGNOSIS — R0683 Snoring: Secondary | ICD-10-CM

## 2021-07-03 DIAGNOSIS — G4733 Obstructive sleep apnea (adult) (pediatric): Secondary | ICD-10-CM

## 2021-07-03 DIAGNOSIS — J449 Chronic obstructive pulmonary disease, unspecified: Secondary | ICD-10-CM

## 2021-07-03 DIAGNOSIS — Z720 Tobacco use: Secondary | ICD-10-CM

## 2021-07-03 NOTE — Progress Notes (Signed)
[facility]  Chief Complaint  Patient presents with   Follow-up    Pt states no concerns     Constitutional:  BP 122/66 (BP Location: Left Arm, Patient Position: Sitting, Cuff Size: Normal)   Pulse 77   Temp 97.6 F (36.4 C) (Oral)   Ht _0  (1.676 m)   Wt 176 lb 3.2 oz (79.9 kg)   SpO2 98%   BMI 28.44 kg/m   Past Medical History:  CAD, Depression, DM type 2, HLD, HTN, Osteopenia, Osteoporosis, RLS  Past Surgical History:  She  has a past surgical history that includes gyn surgery (02/1998); Abdominal hysterectomy; Coronary angioplasty with stent (01/27/2012); left heart catheterization with coronary angiogram (N/A, 01/27/2012); percutaneous coronary stent intervention (pci-s) (01/27/2012); Colonoscopy (10/2006); and Colonoscopy with propofol (N/A, 08/19/2018).  Brief Summary:  Karen Osborne is a 65 y.o. female smoker with COPD from emphysema and snoring.       Subjective:   Had sleep study couples of days ago.  Results not available yet.  Trelegy works well.  Not having as much cough or congestion.    Hasn't set quit date to stop smoking.  She has chantix and nicotine patch.  Hasn't heard about getting follow up CT chest scheduled yet.  PFT showed mild obstruction and mild diffusion defect.  Physical Exam:   Appearance - well kempt   ENMT - no sinus tenderness, no oral exudate, no LAN, Mallampati 3 airway, no stridor, wears dentures  Respiratory - equal breath sounds bilaterally, no wheezing or rales  CV - s1s2 regular rate and rhythm, no murmurs  Ext - no clubbing, no edema  Skin - no rashes  Psych - normal mood and affect    Pulmonary testing:  A1AT 04/06/18 >> 144, MM PFT 05/21/21 >> FEV1 1.78 (72%), FEV1% 76, TLC 4.35 (86%), DLCO 73%  Chest Imaging:  LDCT chest 07/07/20 >> atherosclerosis, small HH, small nodules b/l up to 3.8 mm, diffuse bronchial wall thickening with mild centrilobular and paraseptal  emphysema  Sleep Tests:    Cardiac Tests:  Echo 03/16/17 >> EF 60 to 65%, mild LVH, grade 2 DD  Social History:  She  reports that she has been smoking cigarettes. She has a 96.80 pack-year smoking history. She has never used smokeless tobacco. She reports that she does not drink alcohol and does not use drugs.  Family History:  Her family history includes Diabetes in her sister; Heart attack in her father; Heart disease (age of onset: 19) in her father; Hypertension in her father; Kidney cancer in her father; Ovarian cancer (age of onset: 89) in her maternal aunt; Prostate cancer in her father.     Assessment/Plan:   COPD with emphysema. - continue trelegy 100 one puff daily - discussed role of albuterol as a prn medication  Tobacco abuse. - discussed setting quit date for smoking cessation - she has chantix and nicotine patch - reviewed dosing schedule, setting quit date and side effect to monitor for with chantix - explained she can use nicotine replacement simultaneously with chantix - expected duration of therapy for chantix is 3 to 6 months  Snoring. - will call her with results of recent home sleep study  Lung cancer screening. - will arrange for follow up low dose CT chest - will arrange for [facility] to assume management of her screening program  Coronary artery disease, chronic diastolic CHF. - followed by Dr. Sherren Mocha with [facility]  Time Spent Involved in Patient Care on Day of Examination:  32 minutes  Follow up:   Patient Instructions  Will call you with results of home sleep study  Will arrange for follow up low dose CT chest for lung cancer screening program  Follow up in 4 months  Medication List:   Allergies as of 07/03/2021       Reactions   Penicillins Rash        Medication List        Accurate as of July 03, 2021  9:42 AM. If you have any questions, ask your nurse or doctor.          albuterol 108 (90  Base) MCG/ACT inhaler Commonly known as: VENTOLIN HFA Inhale 2 puffs into the lungs every 6 (six) hours as needed for wheezing or shortness of breath.   B-12 1000 MCG Subl Place 1 tablet under the tongue every Monday, Wednesday, and Friday.   carvedilol 6.25 MG tablet Commonly known as: COREG Take 1.5 tablets (9.375 mg total) by mouth 2 (two) times daily with a meal.   clopidogrel 75 MG tablet Commonly known as: PLAVIX TAKE 1 TABLET BY MOUTH EVERY DAY   glucose blood test strip Commonly known as: ONE TOUCH ULTRA TEST 1 each by Other route daily. Use as instructed to check sugar once a day.   hydrochlorothiazide 12.5 MG capsule Commonly known as: MICROZIDE Take 1 capsule (12.5 mg total) by mouth daily.   Ibuprofen-diphenhydrAMINE HCl 200-25 MG Caps Commonly known as: Advil PM Take 1 capsule by mouth at bedtime as needed.   losartan 50 MG tablet Commonly known as: COZAAR Take 1 tablet (50 mg total) by mouth daily.   metFORMIN 500 MG 24 hr tablet Commonly known as: GLUCOPHAGE-XR Take 2 tablets (1,000 mg total) by mouth daily.   nicotine 14 mg/24hr patch Commonly known as: NICODERM CQ - dosed in mg/24 hours Place 1 patch (14 mg total) onto the skin daily.   nitroGLYCERIN 0.4 MG SL tablet Commonly known as: Nitrostat Place 1 tablet (0.4 mg total) under the tongue every 5 (five) minutes as needed for chest pain (up to 3 doses).   ONE TOUCH ULTRA 2 w/Device Kit 1 each by Does not apply route daily. Use as directed to check sugar once a day   OneTouch Delica Lancets 51T Misc 1 each by Does not apply route daily. Use as directed to check sugar once a day.   rOPINIRole 1 MG tablet Commonly known as: REQUIP Take 1-2 tablets (1-2 mg total) by mouth at bedtime.   rosuvastatin 40 MG tablet Commonly known as: CRESTOR TAKE 1 TABLET BY MOUTH EVERY DAY   Trelegy Ellipta 100-62.5-25 MCG/ACT Aepb Generic drug: Fluticasone-Umeclidin-Vilant Inhale 1 puff into the lungs daily.    varenicline 0.5 MG X 11 & 1 MG X 42 tablet Commonly known as: CHANTIX PAK Take one 0.5 mg tablet by mouth once daily for 3 days, then increase to one 0.5 mg tablet twice daily for 4 days, then increase to one 1 mg tablet twice daily.   Vitamin D 125 MCG (5000 UT) Caps Take 1 capsule by mouth every Monday, Wednesday, and Friday.        Signature:  Chesley Mires, MD [facility] Pager - [phone] 07/03/2021, 9:42 AM

## 2021-07-03 NOTE — Telephone Encounter (Signed)
HST 06/28/21 >> AHI 5.8, SpO2 low 87%   Please let her know her sleep study shows mild sleep apnea.  If she is agreeable, please send order to have her set up with auto CPAP 5 to 15 cm H2O with heated humidity and mask of choice.

## 2021-07-03 NOTE — Patient Instructions (Signed)
Will call you with results of home sleep study  Will arrange for follow up low dose CT chest for lung cancer screening program  Follow up in 4 months

## 2021-07-07 NOTE — Telephone Encounter (Signed)
Patient is aware of results and voiced her understanding.  Cpap order placed. Recall placed. Nothing further needed.

## 2021-07-10 ENCOUNTER — Ambulatory Visit: Payer: BC Managed Care – PPO | Admitting: Obstetrics and Gynecology

## 2021-07-14 ENCOUNTER — Encounter: Payer: Self-pay | Admitting: Family Medicine

## 2021-07-14 ENCOUNTER — Telehealth: Payer: Self-pay

## 2021-07-14 DIAGNOSIS — Z1231 Encounter for screening mammogram for malignant neoplasm of breast: Secondary | ICD-10-CM | POA: Diagnosis not present

## 2021-07-14 LAB — HM MAMMOGRAPHY

## 2021-07-14 NOTE — Telephone Encounter (Signed)
Received faxed 07/14/21 mammogram normal results from [facility].  Need to repeat in 1 yr.    Abstracted results and made pt aware, by phn, of above.  Pt verbalizes understanding.

## 2021-07-15 DIAGNOSIS — U071 COVID-19: Secondary | ICD-10-CM

## 2021-07-15 HISTORY — DX: COVID-19: U07.1

## 2021-07-27 NOTE — Progress Notes (Signed)
PCP: [name], MD   Chief Complaint  Patient presents with   Gynecologic Exam    No concerns    HPI:      Ms. Karen Osborne is a 66 y.o. No obstetric history on file. who LMP was No LMP recorded. Patient has had a hysterectomy., presents today for her annual examination.  Her menses are absent due to TAH/BSO. She does not have PMB. She does not have vasomotor sx.   Sex activity: rarely sexually active, contraception - post menopausal status. She does not have vaginal dryness.  Last Pap: 07/05/18  Results were: no abnormalities /neg HPV DNA.  Hx of STDs: none  Last mammogram: 07/14/21 at [facility]; Results were: normal--routine follow-up in 12 months There is no FH of breast cancer. There is a FH of ovarian cancer in her mat aunt, and prostate cancer and kidney cancer in her dad. She is MyRisk neg 2016. The patient does do self-breast exams.  Colonoscopy: colonoscopy 2019 with abnormalities with Dr. Bonna Gains;  Repeat due after 3 years.   Tobacco use: The patient currently smokes 1/2 packs of cigarettes per day for the past many years. Plans to quit in Jan with Chantix and nicotine patches Alcohol use: none  No drug use Exercise: min active  DEXA 2016 at [facility] with osteoporosis in spine, severe osteopenia in hip. Pt is long term smoker. Pt did fosamax in past but stopped by PCP. DEXA done by PCP  She does get adequate calcium and Vitamin D in her diet.  Labs with PCP.   Past Medical History:  Diagnosis Date   BRCA negative 08/2015   MyRisk neg   Chronic diastolic CHF (congestive heart failure) (HCC)    COPD (chronic obstructive pulmonary disease) with emphysema ([place]) 2019   Coronary artery disease    a. s/p NSTEMI - LHC 01/27/12: prox LAD 20%, mid LAD 99%, prox OM 30%, prox RCA 30%, mid RCA 50%, dist RCA 30%, EF 60%.  PCI:  2.25 x 16 mm Promus Element DES to mid LAD //  b. Myoview (7/15):  Fixed apical defect (breast attenuation versus scar), no ischemia, EF 64%; low  risk  // Myoview 7/22: EF 69, no ischemia or infarction; low risk   Depression    Diabetes mellitus without complication ([place]) 0321   Family history of ovarian cancer    Glucose intolerance (impaired glucose tolerance)    A1C 6.1 01/2012   History of echocardiogram    Echo (01/28/12):  Mild LVH, EF 55%, apical septum and true apex severe HK, Gr 2 DD, mild LAE, normal RVF // Echo 7/18: mild LVH, EF 60-65, no RWMA, Gr 2 DD, normal RVF   History of nuclear stress test    Nuclear stress test 4/19: EF 74, apicoseptal defect c/w atten artifact, normal perfusion; Low Risk   Hypercholesteremia    Hypertension    Myocardial infarction [facility]    NSVD (normal spontaneous vaginal delivery) 1988   Osteopenia 2016   hip   Osteoporosis 2019   in spine and femoral neck; at [facility]   Restless leg syndrome    Tobacco abuse     Past Surgical History:  Procedure Laterality Date   ABDOMINAL HYSTERECTOMY     TAHBSO   COLONOSCOPY  10/2006   TA, [place] (Dr Vira Agar)   COLONOSCOPY WITH PROPOFOL N/A 08/19/2018   TAs, rpt 3 yrs (Tahiliani, Margretta Sidle B, MD)   CORONARY ANGIOPLASTY WITH STENT PLACEMENT  01/27/2012   DES to LAD  gyn surgery  02/1998   total hysterectomy, abn pap smear, no cancer   LEFT HEART CATHETERIZATION WITH CORONARY ANGIOGRAM N/A 01/27/2012   Procedure: LEFT HEART CATHETERIZATION WITH CORONARY ANGIOGRAM;  Surgeon: Burnell Blanks, MD;  Location: [facility] CATH LAB;  Service: Cardiovascular;  Laterality: N/A;   PERCUTANEOUS CORONARY STENT INTERVENTION (PCI-S)  01/27/2012   Procedure: PERCUTANEOUS CORONARY STENT INTERVENTION (PCI-S);  Surgeon: Burnell Blanks, MD;  Location: [facility] CATH LAB;  Service: Cardiovascular;;    Family History  Problem Relation Age of Onset   Prostate cancer Father    Heart disease Father 65   Hypertension Father    Heart attack Father    Kidney cancer Father    Diabetes Sister    Ovarian cancer Maternal Aunt 70   Breast cancer Neg Hx     Social History    Socioeconomic History   Marital status: Married    Spouse name: Not on file   Number of children: 1   Years of education: Not on file   Highest education level: Not on file  Occupational History   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[employer],on the floor  Tobacco Use   Smoking status: Every Day    Packs/day: 2.00    Years: 48.40    Pack years: 96.80    Types: Cigarettes   Smokeless tobacco: Never   Tobacco comments:    0.5PPD 05/14/2021  Vaping Use   Vaping Use: Never used  Substance and Sexual Activity   Alcohol use: No   Drug use: No   Sexual activity: Yes    Birth control/protection: Surgical    Comment: Hysterectomy  Other Topics Concern   Not on file  Social History Narrative   Lives with husband and son   Occ: sales (pipe fitting and valves)   Activity: some walking   Diet: good water, fruits/vegetables daily   Social Determinants of Health   Financial Resource Strain: Not on file  Food Insecurity: Not on file  Transportation Needs: Not on file  Physical Activity: Not on file  Stress: Not on file  Social Connections: Not on file  Intimate Partner Violence: Not on file    Outpatient Medications Prior to Visit  Medication Sig Dispense Refill   albuterol (VENTOLIN HFA) 108 (90 Base) MCG/ACT inhaler Inhale 2 puffs into the lungs every 6 (six) hours as needed for wheezing or shortness of breath. 8 g 6   Blood Glucose Monitoring Suppl (ONE TOUCH ULTRA 2) w/Device KIT 1 each by Does not apply route daily. Use as directed to check sugar once a day 1 each 0   carvedilol (COREG) 6.25 MG tablet Take 1.5 tablets (9.375 mg total) by mouth 2 (two) times daily with a meal. 180 tablet 2   Cholecalciferol (VITAMIN D) 125 MCG (5000 UT) CAPS Take 1 capsule by mouth every Monday, Wednesday, and Friday.     clopidogrel (PLAVIX) 75 MG tablet TAKE 1 TABLET BY MOUTH EVERY DAY 90 tablet 3   Cyanocobalamin (B-12) 1000 MCG SUBL Place 1 tablet under the tongue every  Monday, Wednesday, and Friday.     Fluticasone-Umeclidin-Vilant (TRELEGY ELLIPTA) 100-62.5-25 MCG/INH AEPB Inhale 1 puff into the lungs daily. 60 each 5   glucose blood (ONE TOUCH ULTRA TEST) test strip 1 each by Other route daily. Use as instructed to check sugar once a day. 100 each 0   hydrochlorothiazide (MICROZIDE) 12.5 MG capsule Take 1 capsule (12.5 mg total) by mouth daily. 90 capsule 2  Ibuprofen-Diphenhydramine HCl (ADVIL PM) 200-25 MG CAPS Take 1 capsule by mouth at bedtime as needed.     losartan (COZAAR) 50 MG tablet Take 1 tablet (50 mg total) by mouth daily. 90 tablet 1   metFORMIN (GLUCOPHAGE-XR) 500 MG 24 hr tablet Take 2 tablets (1,000 mg total) by mouth daily. 180 tablet 3   nitroGLYCERIN (NITROSTAT) 0.4 MG SL tablet Place 1 tablet (0.4 mg total) under the tongue every 5 (five) minutes as needed for chest pain (up to 3 doses). 25 tablet 3   ONETOUCH DELICA LANCETS 16W MISC 1 each by Does not apply route daily. Use as directed to check sugar once a day. 100 each 0   rOPINIRole (REQUIP) 1 MG tablet Take 1-2 tablets (1-2 mg total) by mouth at bedtime. 180 tablet 2   rosuvastatin (CRESTOR) 40 MG tablet TAKE 1 TABLET BY MOUTH EVERY DAY 90 tablet 3   nicotine (NICODERM CQ - DOSED IN MG/24 HOURS) 14 mg/24hr patch Place 1 patch (14 mg total) onto the skin daily. (Patient not taking: Reported on 07/28/2021) 28 patch 0   varenicline (CHANTIX PAK) 0.5 MG X 11 & 1 MG X 42 tablet Take one 0.5 mg tablet by mouth once daily for 3 days, then increase to one 0.5 mg tablet twice daily for 4 days, then increase to one 1 mg tablet twice daily. (Patient not taking: Reported on 07/28/2021) 53 tablet 0   No facility-administered medications prior to visit.     ROS:  Review of Systems  Constitutional:  Negative for fatigue, fever and unexpected weight change.  Respiratory:  Negative for cough, shortness of breath and wheezing.   Cardiovascular:  Negative for chest pain, palpitations and leg  swelling.  Gastrointestinal:  Negative for blood in stool, constipation, diarrhea, nausea and vomiting.  Endocrine: Negative for cold intolerance, heat intolerance and polyuria.  Genitourinary:  Negative for dyspareunia, dysuria, flank pain, frequency, genital sores, hematuria, menstrual problem, pelvic pain, urgency, vaginal bleeding, vaginal discharge and vaginal pain.  Musculoskeletal:  Negative for back pain, joint swelling and myalgias.  Skin:  Negative for rash.  Neurological:  Negative for dizziness, syncope, light-headedness, numbness and headaches.  Hematological:  Negative for adenopathy.  Psychiatric/Behavioral:  Negative for agitation, confusion, dysphoric mood, sleep disturbance and suicidal ideas. The patient is not nervous/anxious.  BREAST: No symptoms   Objective: BP 130/60   Ht 5' 6"  (1.676 m)   Wt 178 lb (80.7 kg)   BMI 28.73 kg/m    Physical Exam Constitutional:      Appearance: She is well-developed.  Genitourinary:     Vulva normal.     Genitourinary Comments: UTERUS/CX SURG REM     Right Labia: No rash, tenderness or lesions.    Left Labia: No tenderness, lesions or rash.    Vaginal cuff intact.    No vaginal discharge, erythema or tenderness.      Right Adnexa: absent.    Right Adnexa: not tender and no mass present.    Left Adnexa: absent.    Left Adnexa: not tender and no mass present.    Cervix is absent.     Uterus is absent.  Breasts:    Right: No mass, nipple discharge, skin change or tenderness.     Left: No mass, nipple discharge, skin change or tenderness.  Neck:     Thyroid: No thyromegaly.  Cardiovascular:     Rate and Rhythm: Normal rate and regular rhythm.     Heart sounds: Normal heart  sounds. No murmur heard. Pulmonary:     Effort: Pulmonary effort is normal.     Breath sounds: Normal breath sounds.  Abdominal:     Palpations: Abdomen is soft.     Tenderness: There is no abdominal tenderness. There is no guarding.   Musculoskeletal:        General: Normal range of motion.     Cervical back: Normal range of motion.  Neurological:     General: No focal deficit present.     Mental Status: She is alert and oriented to person, place, and time.     Cranial Nerves: No cranial nerve deficit.  Skin:    General: Skin is warm and dry.  Psychiatric:        Mood and Affect: Mood normal.        Behavior: Behavior normal.        Thought Content: Thought content normal.        Judgment: Judgment normal.  Vitals reviewed.    Assessment/Plan:  Encounter for annual routine gynecological examination  Encounter for screening mammogram for malignant neoplasm of breast; pt current on mammo  Screening for colon cancer--pt to call [facility] to sched colonoscopy       GYN counsel breast self exam, mammography screening, menopause, adequate intake of calcium and vitamin D, diet and exercise    F/U  Return in about 1 year (around 07/28/2022).  Prim Morace B. Jacqueline Delapena, PA-C 07/28/2021 10:24 AM

## 2021-07-28 ENCOUNTER — Other Ambulatory Visit: Payer: Self-pay

## 2021-07-28 ENCOUNTER — Ambulatory Visit (INDEPENDENT_AMBULATORY_CARE_PROVIDER_SITE_OTHER): Payer: BC Managed Care – PPO | Admitting: Obstetrics and Gynecology

## 2021-07-28 ENCOUNTER — Ambulatory Visit
Admission: RE | Admit: 2021-07-28 | Discharge: 2021-07-28 | Disposition: A | Discharge status: Home / Self Care | Payer: BC Managed Care – PPO | Source: Ambulatory Visit | Attending: Pulmonary Disease | Admitting: Pulmonary Disease

## 2021-07-28 ENCOUNTER — Encounter: Payer: Self-pay | Admitting: Obstetrics and Gynecology

## 2021-07-28 VITALS — BP 130/60 | Ht 66.0 in | Wt 178.0 lb

## 2021-07-28 DIAGNOSIS — Z1231 Encounter for screening mammogram for malignant neoplasm of breast: Secondary | ICD-10-CM

## 2021-07-28 DIAGNOSIS — Z1211 Encounter for screening for malignant neoplasm of colon: Secondary | ICD-10-CM

## 2021-07-28 DIAGNOSIS — Z01419 Encounter for gynecological examination (general) (routine) without abnormal findings: Secondary | ICD-10-CM

## 2021-07-28 DIAGNOSIS — Z720 Tobacco use: Secondary | ICD-10-CM | POA: Insufficient documentation

## 2021-07-28 DIAGNOSIS — Z87891 Personal history of nicotine dependence: Secondary | ICD-10-CM | POA: Diagnosis not present

## 2021-07-28 NOTE — Patient Instructions (Signed)
I value your feedback and you entrusting us with your care. If you get a [place] patient survey, I would appreciate you taking the time to let us know about your experience today. Thank you! ? ? ?

## 2021-07-31 ENCOUNTER — Encounter: Payer: Self-pay | Admitting: *Deleted

## 2021-07-31 ENCOUNTER — Other Ambulatory Visit: Payer: Self-pay | Admitting: *Deleted

## 2021-07-31 DIAGNOSIS — Z87891 Personal history of nicotine dependence: Secondary | ICD-10-CM

## 2021-07-31 DIAGNOSIS — F17210 Nicotine dependence, cigarettes, uncomplicated: Secondary | ICD-10-CM

## 2021-08-04 ENCOUNTER — Telehealth (INDEPENDENT_AMBULATORY_CARE_PROVIDER_SITE_OTHER): Payer: BC Managed Care – PPO | Admitting: Family Medicine

## 2021-08-04 ENCOUNTER — Encounter: Payer: Self-pay | Admitting: Cardiovascular Disease

## 2021-08-04 ENCOUNTER — Encounter: Payer: Self-pay | Admitting: Family Medicine

## 2021-08-04 ENCOUNTER — Other Ambulatory Visit: Payer: Self-pay

## 2021-08-04 VITALS — BP 143/72 | HR 73 | Temp 96.3°F | Ht 64.5 in

## 2021-08-04 DIAGNOSIS — U071 COVID-19: Secondary | ICD-10-CM

## 2021-08-04 MED ORDER — MOLNUPIRAVIR EUA 200MG CAPSULE: 4.0000 | ORAL_CAPSULE | Freq: Two times a day (BID) | ORAL | 0 refills | Status: AC

## 2021-08-04 NOTE — Progress Notes (Signed)
      Karen Klett T. Fryda Molenda, MD Primary Care and Sports Medicine [facility] at [facility] [place], [address] Phone: [phone]  FAX: [phone]  Karen Osborne - 65 y.o. female  MRN [id number]  Date of Birth: February 27, 1956  Visit Date: 08/04/2021  PCP: [name], MD  Referred by: [name], MD  Virtual Visit via Video Note:  I connected with  Karen Osborne on 08/04/2021  9:00 AM EST by a video enabled telemedicine application and verified that I am speaking with the correct person using two identifiers.   Location patient: home computer, tablet, or smartphone Location provider: work or home office Consent: Verbal consent directly obtained from Karen Osborne. Persons participating in the virtual visit: patient, provider  I discussed the limitations of evaluation and management by telemedicine and the availability of in person appointments. The patient expressed understanding and agreed to proceed.  Chief Complaint  Patient presents with   Fever   Nasal Congestion   Sore Throat    History of Present Illness:  Friday.  101.6 fever Congested, achy St Better today.  Since Friday the patient has been febrile with a T-max of almost 102, she is congested, sore throat, she has a headache.  She globally does not feel all that well.  She denies any GI symptoms.  She has a lot of congestion in her upper airways and chest.  She has not taken a COVID test, and she is going to do this right now.  Review of Systems as above: See pertinent positives and pertinent negatives per HPI No acute distress verbally   Observations/Objective/Exam:  An attempt was made to discern vital signs over the phone and per patient if applicable and possible.   General:    Alert, Oriented, appears well and in no acute distress  Pulmonary:     On inspection no signs of respiratory distress.  Psych / Neurological:     Pleasant and  cooperative.  Assessment and Plan:    ICD-10-CM   1. COVID-19  U07.1      [name] COVID test did return positive.  I am going to place her on antivirals, she is going to continue with supportive care, rest, and I reviewed isolation.  I discussed the assessment and treatment plan with the patient. The patient was provided an opportunity to ask questions and all were answered. The patient agreed with the plan and demonstrated an understanding of the instructions.   The patient was advised to call back or seek an in-person evaluation if the symptoms worsen or if the condition fails to improve as anticipated.  Follow-up: prn unless noted otherwise below No follow-ups on file.  Meds ordered this encounter  Medications   molnupiravir EUA (LAGEVRIO) 200 mg CAPS capsule    Sig: Take 4 capsules (800 mg total) by mouth 2 (two) times daily for 5 days.    Dispense:  40 capsule    Refill:  0   No orders of the defined types were placed in this encounter.   Signed,  Maud Deed. Navjot Pilgrim, MD

## 2021-08-11 ENCOUNTER — Telehealth: Payer: Self-pay

## 2021-08-11 NOTE — Telephone Encounter (Addendum)
I spoke with pt; pt did not go to UC; rash on legs and arms that were red blotches with white points in the rash but did not drain or ooze; just went away. lasted from [place] until Sat. Pt did not take anymore of the molnupiravir after 08/07/21. Pt had taken 2 1/2 days of molnupiravir. No swelling in throat, neck or tongue or difficulty breathing at any time. Pt had not changed any personal hygiene items, laundry detergent and no new clothes or furniture. Pt feels better with + covid. Pt feels fine except has head congestion. UC & ED precautions given and pt voiced understanding.[facility]. Pt request cb after Dr Darnell Level reviews the note.

## 2021-08-11 NOTE — Telephone Encounter (Signed)
Called patient reviewed all information and repeated back to me. Will call if any questions.  ? ?

## 2021-08-11 NOTE — Telephone Encounter (Signed)
[place] Night - Client TELEPHONE ADVICE RECORD [facility] Patient Name: Karen Osborne Gender: Female DOB: 08-Aug-1956 Age: 65 Y 60 M 15 D Return Phone Number: [phone] (Primary), [phone] (Secondary) Address: City/ State/ Zip: [place] [address] Client [facility] Night - Client Client Site [place] Provider Owens Loffler - MD Contact Type Call Who Is Calling Patient / Member / Family / Caregiver Call Type Triage / Clinical Relationship To Patient Self Return Phone Number [phone] (Primary) Chief Complaint Hives Reason for Call Symptomatic / Request for Health Information Initial Comment Caller states she has COvid, and was prescribed medication, patient woke this morning in red hives. Patient appointment was prescribe medication this pass Monday [facility] Translation No Nurse Assessment Nurse: Jeneen Rinks, RN, [place] Date/Time Eilene Ghazi Time): 08/07/2021 10:57:25 AM Confirm and document reason for call. If symptomatic, describe symptoms. ---Caller states she has Covid, and was prescribed medication, patient woke this morning in red hives. Does the patient have any new or worsening symptoms? ---Yes Will a triage be completed? ---Yes Related visit to physician within the last 2 weeks? ---Yes Does the PT have any chronic conditions? (i.e. diabetes, asthma, this includes High risk factors for pregnancy, etc.) ---Yes List chronic conditions. ---COPD, diabetes, heart Is this a behavioral health or substance abuse call? ---No Guidelines Guideline Title Affirmed Question Affirmed Notes Nurse Date/Time (Eastern Time) Hives [1] Widespread hives, itching or facial swelling AND [2] onset > 2 hours after exposure to highrisk allergen (e.g., sting, nuts, 1st dose of antibiotic) Jeneen Rinks, RN, [place] 08/07/2021  10:58:46 AM PLEASE NOTE: All timestamps contained within this report are represented as [place] Standard Time. CONFIDENTIALTY NOTICE: This fax transmission is intended only for the addressee. It contains information that is legally privileged, confidential or otherwise protected from use or disclosure. If you are not the intended recipient, you are strictly prohibited from reviewing, disclosing, copying using or disseminating any of this information or taking any action in reliance on or regarding this information. If you have received this fax in error, please notify us immediately by telephone so that we can arrange for its return to [place]. Phone: [phone], Toll-Free: [phone], Fax: [phone] Page: 2 of 2 Call Id: [id number] [place]. Time Eilene Ghazi Time) Disposition Final User 08/07/2021 11:10:37 AM See PCP within 24 Hours Yes Jeneen Rinks, RN, [place] Disposition Overriden: SEE PCP WITHIN 3 DAYS Override Reason: Patient's symptoms need a higher level of care Caller Disagree/Comply Comply Caller Understands Yes PreDisposition Gas Advice Given Per Guideline SEE PCP WITHIN 3 DAYS: * You need to be seen within 2 or 3 days. * PCP VISIT: Call your doctor (or NP/PA) during regular office hours and make an appointment. A clinic or urgent care center are good places to go for care if your doctor's office is closed or you can't get an appointment. NOTE: If office will be open tomorrow, tell caller to call then, not in 3 days. ANTIHISTAMINE MEDICINES FOR WIDESPREAD HIVES: * For widespread hives, you can take cetirizine, fexofendadine, or loratadine. They can help reduce the rash and any itching. * They are over-the-counter (OTC) antihistamine medicines. You can buy them at a drugstore or grocery store. SEE PCP WITHIN 24 HOURS: * IF OFFICE WILL BE CLOSED: You need to be seen within the next 24 hours. A clinic or an urgent care center is often a good source of care if  your doctor's office is  closed or you can't get an appointment. NOTE TO TRIAGER: * Use nurse judgment to select the most appropriate source of care. * Consider both the urgency of the patient's symptoms AND what resources may be needed to evaluate and manage the patient. ANTIHISTAMINE MEDICINES FOR WIDESPREAD HIVES: * For widespread hives, you can take cetirizine, fexofendadine, or loratadine. They can help reduce the rash and any itching. * They are over-the-counter (OTC) antihistamine medicines. You can buy them at a drugstore or grocery store. COOL BATH FOR ITCHING: * Take a cool bath for 10 minutes to relieve itching. (Caution: avoid any chill). * Rub very itchy areas with an ice cube for 10 minutes. PREVENTION - REMOVE ALLERGENS: HYDROCORTISONE CREAM FOR VERY ITCHY SPOTS: * You can use hydrocortisone for very itchy spots. * Put 1% hydrocortisone cream on the itchy area(s) 3 times a day. Use it for a couple days, until it feels better. This will help decrease the itching. CALL BACK IF: * You become worse CARE ADVICE given per Hives (Adult) guideline. * CETIRIZINE (REACTINE, ZYRTEC): The adult dose is 10 mg. You take it once a day. Cetirizine is available in the [place] as Zyrtec and in [place] as Reactine. * FEXOFENADINE (ALLEGRA): In the [place], the adult dose is one 24-hour tablet (180 mg) once a day. In [place], the adult dose is one 24-hour tablet (120 mg) once a day. Or, you can take one 12-hour (60 mg) tablet 2 times a day. * LORATADINE (ALAVERT, CLARITIN): The adult dose is 10 mg. You take it once a day. Loratadine is available in the [place] as Engineer, maintenance (IT); it is available in [place] as Claritin. Referrals GO TO FACILITY OTHER - SPECIF

## 2021-08-11 NOTE — Telephone Encounter (Signed)
Agree with stopping molnupiravir which may have caused rash.  As long as continues feeling well, no need for further COVID treatment.  Anticipate continued improvement each day, let us know if that's not the case.

## 2021-08-31 ENCOUNTER — Other Ambulatory Visit: Payer: Self-pay | Admitting: Cardiovascular Disease

## 2021-08-31 DIAGNOSIS — I10 Essential (primary) hypertension: Secondary | ICD-10-CM

## 2021-08-31 DIAGNOSIS — I2510 Atherosclerotic heart disease of native coronary artery without angina pectoris: Secondary | ICD-10-CM

## 2021-09-02 DIAGNOSIS — G4733 Obstructive sleep apnea (adult) (pediatric): Secondary | ICD-10-CM | POA: Diagnosis not present

## 2021-09-15 ENCOUNTER — Encounter: Payer: Self-pay | Admitting: Family Medicine

## 2021-09-15 DIAGNOSIS — R918 Other nonspecific abnormal finding of lung field: Secondary | ICD-10-CM | POA: Insufficient documentation

## 2021-09-19 IMAGING — CT CT CHEST LUNG CANCER SCREENING LOW DOSE W/O CM
2 of 5 series · 15 of 40 positions shown, 18 images · non-contrast
Comparison: Low-dose lung cancer screening chest CT 06/21/2018.

CLINICAL DATA: 63-year-old female current smoker with 73 pack-year
history of smoking. Lung cancer screening examination.

EXAM:
CT CHEST WITHOUT CONTRAST LOW-DOSE FOR LUNG CANCER SCREENING
TECHNIQUE: Multidetector CT imaging of the chest was performed following the
standard protocol without IV contrast.

[Series 3: lung · axial · 0.68mm/px · z∈[-1166,-878]mm · 12 of 320 slices shown, 15 images]
[im 16/320  mediastinal]
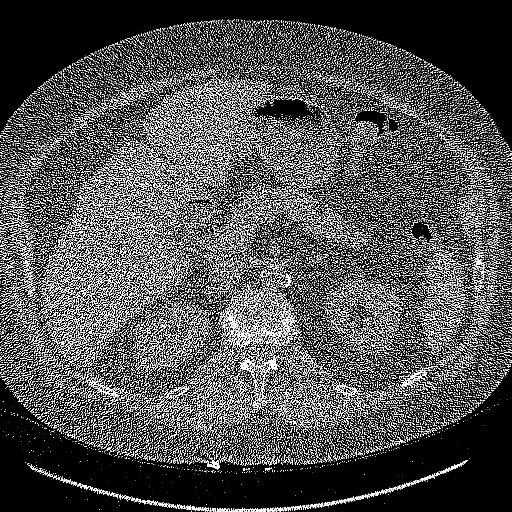
[im 16/320  lung]
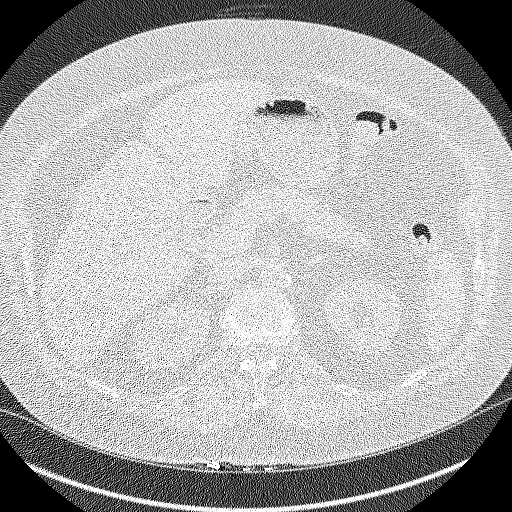
[im 46/320  lung]
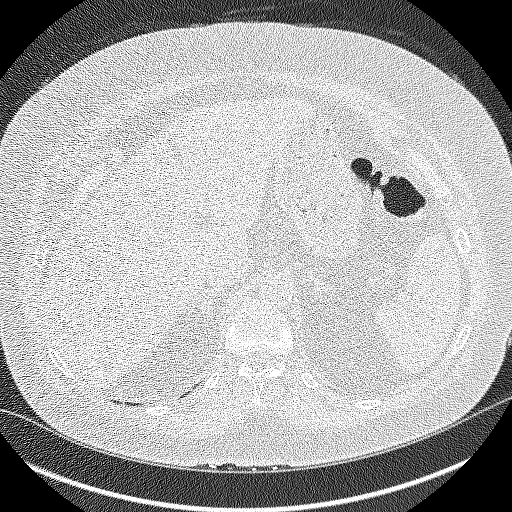
[im 76/320  lung]
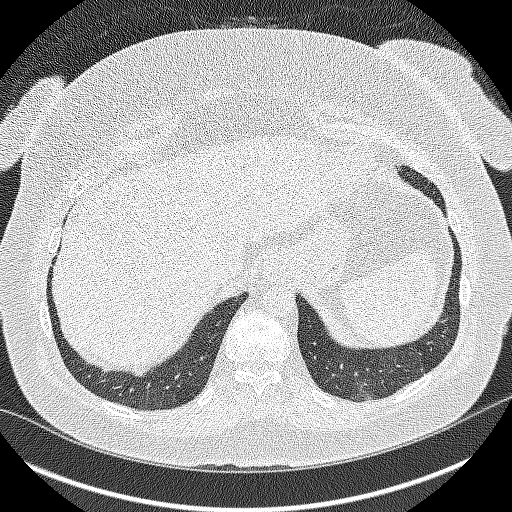
[im 92/320  lung]
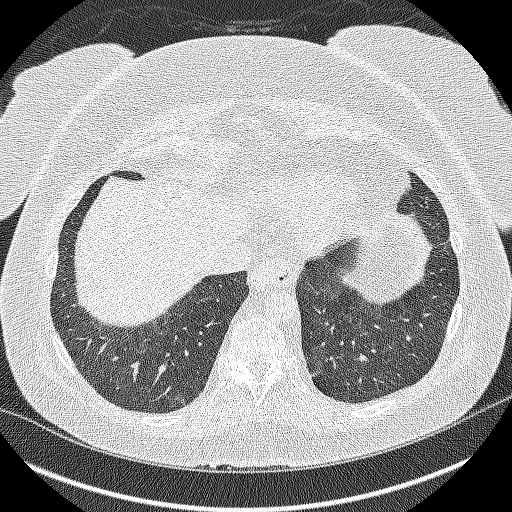
[im 122/320  mediastinal]
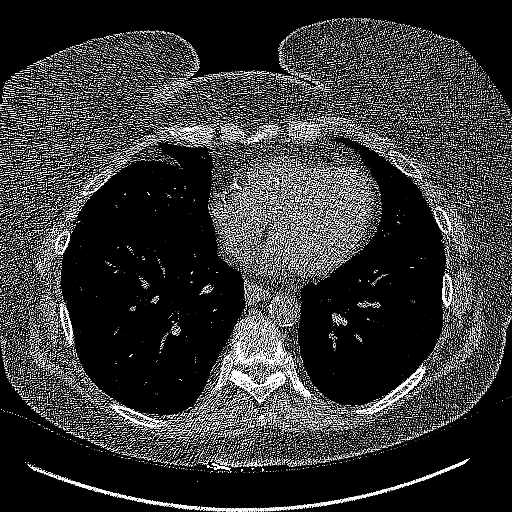
[im 122/320  lung]
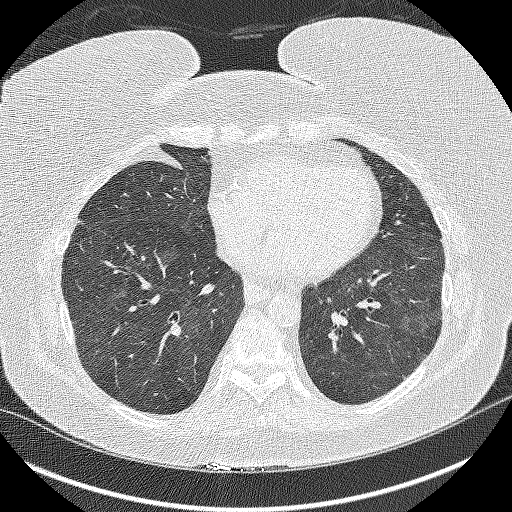
[im 152/320  lung]
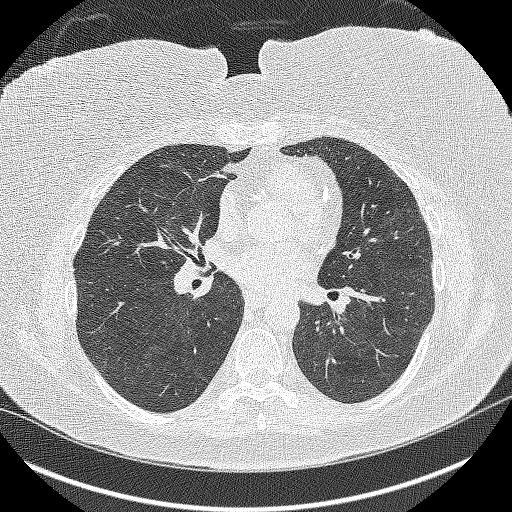
[im 168/320  lung]
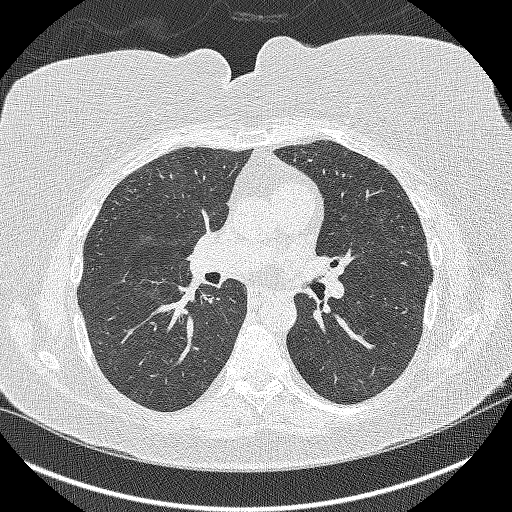
[im 198/320  lung]
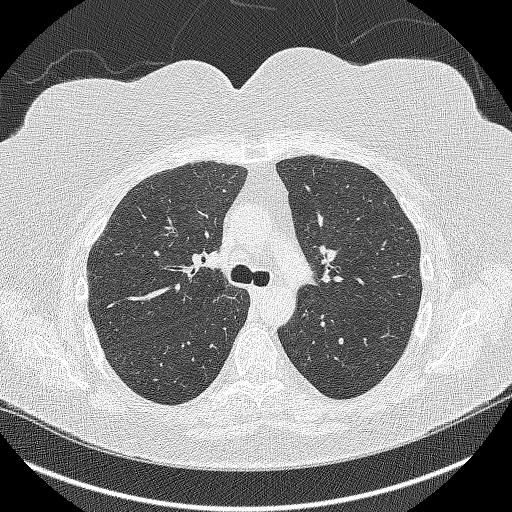
[im 228/320  mediastinal]
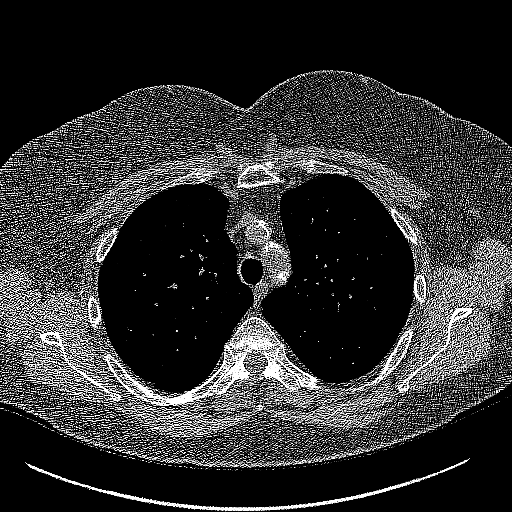
[im 228/320  lung]
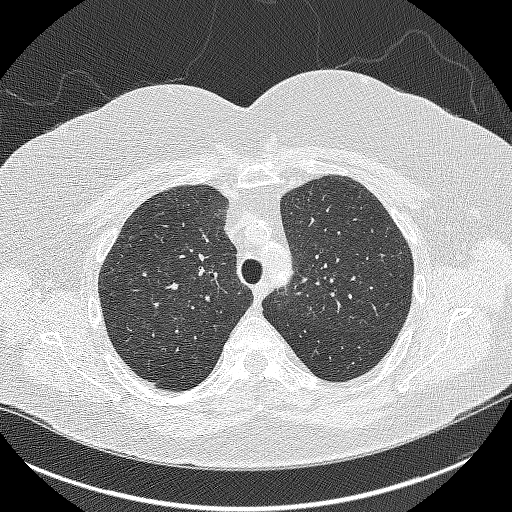
[im 244/320  lung]
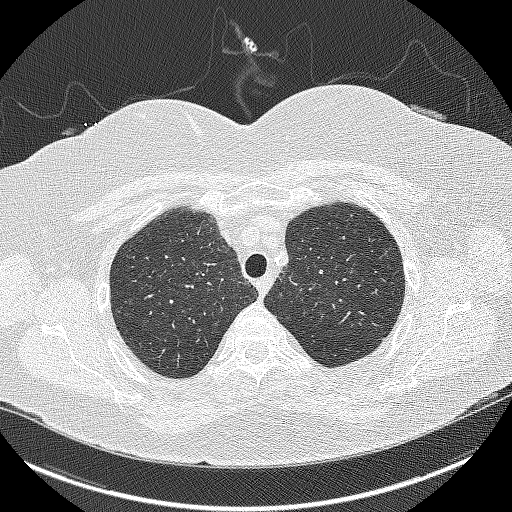
[im 274/320  lung]
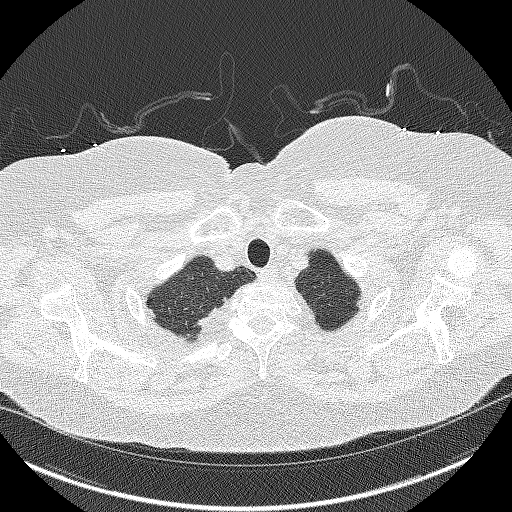
[im 304/320  lung]
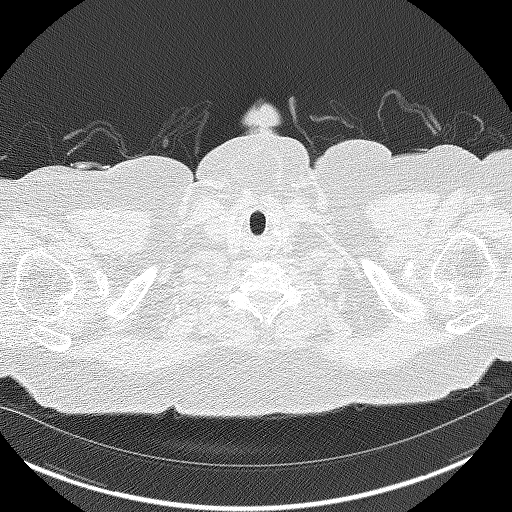

[Series 4: coronals lung · coronal · 0.63mm/px · 3 of 324 slices shown]
[im 65/324  lung]
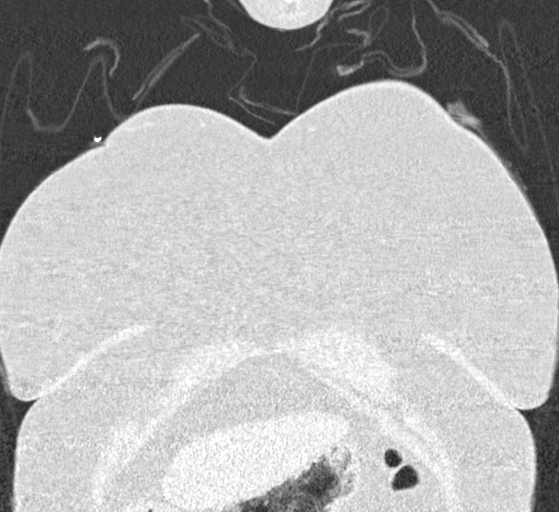
[im 130/324  lung]
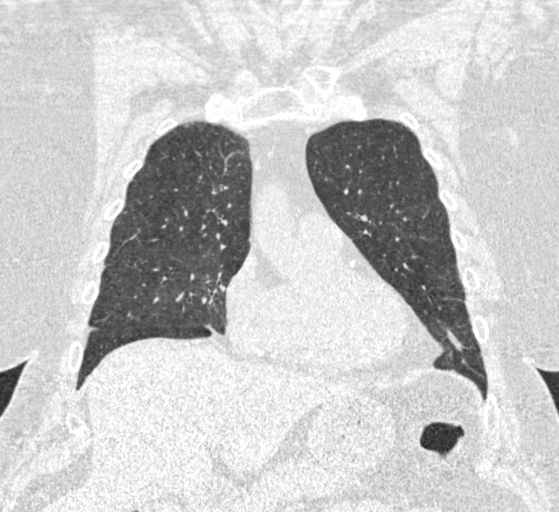
[im 194/324  lung]
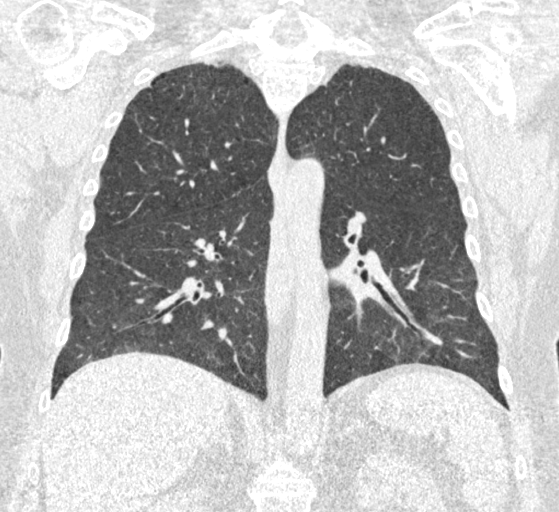

[15 of 40 positions shown; findings below may reference images not displayed]

FINDINGS: Cardiovascular: Heart size is normal. There is no significant
pericardial fluid, thickening or pericardial calcification. There is
aortic atherosclerosis, as well as atherosclerosis of the great
vessels of the mediastinum and the coronary arteries, including
calcified atherosclerotic plaque in the left main, left anterior
descending, left circumflex and right coronary arteries.

Mediastinum/Nodes: No pathologically enlarged mediastinal or hilar
lymph nodes. Please note that accurate exclusion of hilar adenopathy
is limited on noncontrast CT scans. Esophagus is unremarkable in
appearance. No axillary lymphadenopathy.

Lungs/Pleura: Small pulmonary nodules are again noted, largest of
which is in the posterior aspect of the right upper lobe near the
apex (axial image 60 of series 3), with a volume derived mean
diameter of 3.8 mm. No larger more suspicious appearing pulmonary
nodules or masses are noted. No acute consolidative airspace
disease. No pleural effusions. Mild diffuse bronchial wall
thickening with mild centrilobular and paraseptal emphysema.

Upper Abdomen: Aortic atherosclerosis.

Musculoskeletal: There are no aggressive appearing lytic or blastic
lesions noted in the visualized portions of the skeleton.
IMPRESSION: 1. Lung-RADS 2S, benign appearance or behavior. Continue annual
screening with low-dose chest CT without contrast in 12 months.
2. The "S" modifier above refers to potentially clinically
significant non lung cancer related findings. Specifically, there is
aortic atherosclerosis, in addition to left main and 3 vessel
coronary artery disease. Please note that although the presence of
coronary artery calcium documents the presence of coronary artery
disease, the severity of this disease and any potential stenosis
cannot be assessed on this non-gated CT examination. Assessment for
potential risk factor modification, dietary therapy or pharmacologic
therapy may be warranted, if clinically indicated.
3. Mild diffuse bronchial wall thickening with mild centrilobular
and paraseptal emphysema; imaging findings suggestive of underlying
COPD.

Aortic Atherosclerosis (NBCZK-HD9.9) and Emphysema (NBCZK-AOQ.X).

## 2021-09-22 NOTE — Progress Notes (Deleted)
Cardiology Office Note:    Date:  09/22/2021   ID:  Karen Osborne, DOB June 20, 1956, MRN [id number]  PCP:  Karen Osborne, [place] Prov[facility] Cardiologist:  Sherren Mocha, MD Cardiology APP:  Liliane Shi, PA-C { Click to update primary MD,subspecialty MD or APP then REFRESH:1}  *** Referring MD: Karen Bush, MD   Chief Complaint:  No chief complaint on file. {Click here for Visit Info    :1}   Patient Profile:   Karen Osborne is a 66 y.o. female with:  Coronary artery disease S/p non-STEMI 5/13>>PCI: DES to the mid LAD Myoview 7/15: low risk Echo 7/18: EF 60-65, GRII DD Myoview 4/19: Low risk Myoview 7/22:  Low risk  Hypertension Hyperlipidemia Tobacco use Diabetes mellitus Aortic atherosclerosis COPD  History of Present Illness: Ms. Karen Osborne was last seen in 7/22.  She noted continued symptoms of L arm and chest heaviness.  I placed her on Isosorbide and obtained a Myoview, which was low risk and negative for ischemia.  She returns for f/u.  ***  ASSESSMENT & PLAN:   No problem-specific Assessment & Plan notes found for this encounter. CAD with angina History of non-STEMI in 2013 treated with DES to the LAD.  She had moderate nonobstructive disease elsewhere.  Nuclear stress test in 2019 was low risk.  She has had left arm and chest heaviness.  This is nonexertional and not worsened by exertion.  It is not reminiscent of her previous angina.  Her electrocardiogram does not demonstrate any acute changes.  Therefore, I think it is reasonable to proceed with medical therapy and stress testing initially.  We reviewed this in she agrees to proceed.  Continue current dose of carvedilol, rosuvastatin, clopidogrel.  Start isosorbide 15 mg daily.  Obtain Lexiscan Myoview.  Follow-up with me in 4 to 6 weeks.  Hypertension Blood pressure above target.  Add isosorbide as noted.  Continue current dose of hydrochlorothiazide, carvedilol,  lisinopril.  Hyperlipidemia LDL optimal on most recent lab work.  Continue current Rx.    COPD, tobacco abuse I have again advised her to quit smoking.      {Are you ordering a CV Procedure (e.g. stress test, cath, DCCV, TEE, etc)?   Press F2        :[id number]}   Dispo:  No follow-ups on file.    Prior CV studies: Myoview 03/31/21 EF 69, no ischemia or infarction; low risk   Echo 03/16/2017 Mild LVH, EF 50-53, grade 2 diastolic dysfunction, normal wall motion   LHC (01/27/12):   prox LAD 20%, mid LAD 99%, prox OM 30%, prox RCA 30%, mid RCA 50%, dist RCA 30%, EF 60%.   PCI:  2.25 x 16 mm Promus Element DES to mid LAD.    {Select studies to display:26339}    Past Medical History:  Diagnosis Date   BRCA negative 08/2015   MyRisk neg   Chronic diastolic CHF (congestive heart failure) (HCC)    COPD (chronic obstructive pulmonary disease) with emphysema ([place]) 2019   Coronary artery disease    a. s/p NSTEMI - LHC 01/27/12: prox LAD 20%, mid LAD 99%, prox OM 30%, prox RCA 30%, mid RCA 50%, dist RCA 30%, EF 60%.  PCI:  2.25 x 16 mm Promus Element DES to mid LAD //  b. Myoview (7/15):  Fixed apical defect (breast attenuation versus scar), no ischemia, EF 64%; low risk  // Myoview 7/22: EF 69, no ischemia or infarction; low risk  COVID-19 virus infection 07/2021   Rx molnupiravir -> rash so stopped early   Depression    Diabetes mellitus without complication ([place]) 1761   Family history of ovarian cancer    Glucose intolerance (impaired glucose tolerance)    A1C 6.1 01/2012   History of echocardiogram    Echo (01/28/12):  Mild LVH, EF 55%, apical septum and true apex severe HK, Gr 2 DD, mild LAE, normal RVF // Echo 7/18: mild LVH, EF 60-65, no RWMA, Gr 2 DD, normal RVF   History of nuclear stress test    Nuclear stress test 4/19: EF 74, apicoseptal defect c/w atten artifact, normal perfusion; Low Risk   Hypercholesteremia    Hypertension    Myocardial infarction ([place])    NSVD (normal  spontaneous vaginal delivery) 1988   Osteopenia 2016   hip   Osteoporosis 2019   in spine and femoral neck; at [facility]   Restless leg syndrome    Tobacco abuse    Current Medications: No outpatient medications have been marked as taking for the 09/23/21 encounter (Appointment) with Richardson Dopp T, PA-C.    Allergies:   Penicillins   Social History   Tobacco Use   Smoking status: Every Day    Packs/day: 2.00    Years: 48.40    Pack years: 96.80    Types: Cigarettes   Smokeless tobacco: Never   Tobacco comments:    0.5PPD 05/14/2021  Vaping Use   Vaping Use: Never used  Substance Use Topics   Alcohol use: No   Drug use: No    Family Hx: The patient's family history includes Diabetes in her sister; Heart attack in her father; Heart disease (age of onset: 65) in her father; Hypertension in her father; Kidney cancer in her father; Ovarian cancer (age of onset: 22) in her maternal aunt; Prostate cancer in her father. There is no history of Breast cancer.  ROS   EKGs/Labs/Other Test Reviewed:    EKG:  EKG is *** ordered today.  The ekg ordered today demonstrates ***  Recent Labs: 01/20/2021: ALT 12; BUN 11; Creatinine, Ser 0.92; Potassium 4.9; Sodium 139 01/24/2021: TSH 1.34   Recent Lipid Panel Lab Results  Component Value Date/Time   CHOL 96 01/20/2021 07:43 AM   CHOL 123 06/25/2017 07:34 AM   TRIG 91.0 01/20/2021 07:43 AM   HDL 34.30 (L) 01/20/2021 07:43 AM   HDL 28 (L) 06/25/2017 07:34 AM   LDLCALC 43 01/20/2021 07:43 AM   LDLCALC 64 06/25/2017 07:34 AM     Risk Assessment/Calculations:   {Does this patient have ATRIAL FIBRILLATION?[phone]}      Physical Exam:    VS:  There were no vitals taken for this visit.    Wt Readings from Last 3 Encounters:  07/28/21 178 lb (80.7 kg)  07/28/21 178 lb (80.7 kg)  07/03/21 176 lb 3.2 oz (79.9 kg)    Physical Exam ***    Medication Adjustments/Labs and Tests Ordered: Current medicines are reviewed at length with  the patient today.  Concerns regarding medicines are outlined above.  Tests Ordered: No orders of the defined types were placed in this encounter.  Medication Changes: No orders of the defined types were placed in this encounter.  Signed, Richardson Dopp, PA-C  09/22/2021 2:31 PM    [facility] [place], [place], [place]  [address] Phone: [phone]; Fax: [phone]

## 2021-09-23 ENCOUNTER — Ambulatory Visit: Payer: BC Managed Care – PPO | Admitting: Physician Assistant

## 2021-09-23 DIAGNOSIS — J449 Chronic obstructive pulmonary disease, unspecified: Secondary | ICD-10-CM

## 2021-09-23 DIAGNOSIS — I10 Essential (primary) hypertension: Secondary | ICD-10-CM

## 2021-09-23 DIAGNOSIS — I25119 Atherosclerotic heart disease of native coronary artery with unspecified angina pectoris: Secondary | ICD-10-CM

## 2021-09-23 DIAGNOSIS — E7800 Pure hypercholesterolemia, unspecified: Secondary | ICD-10-CM

## 2021-09-23 DIAGNOSIS — I700 Atherosclerosis of aorta: Secondary | ICD-10-CM

## 2021-10-14 ENCOUNTER — Other Ambulatory Visit: Payer: Self-pay

## 2021-10-14 ENCOUNTER — Ambulatory Visit (INDEPENDENT_AMBULATORY_CARE_PROVIDER_SITE_OTHER)
Admission: RE | Admit: 2021-10-14 | Discharge: 2021-10-14 | Disposition: A | Discharge status: Home / Self Care | Payer: 59 | Source: Ambulatory Visit | Attending: Nurse Practitioner | Admitting: Nurse Practitioner

## 2021-10-14 ENCOUNTER — Ambulatory Visit (INDEPENDENT_AMBULATORY_CARE_PROVIDER_SITE_OTHER): Payer: 59 | Admitting: Nurse Practitioner

## 2021-10-14 ENCOUNTER — Encounter: Payer: Self-pay | Admitting: Nurse Practitioner

## 2021-10-14 VITALS — BP 108/76 | HR 73 | Temp 96.8°F | Resp 10 | Ht 64.5 in | Wt 181.1 lb

## 2021-10-14 DIAGNOSIS — M5440 Lumbago with sciatica, unspecified side: Secondary | ICD-10-CM

## 2021-10-14 DIAGNOSIS — M6289 Other specified disorders of muscle: Secondary | ICD-10-CM | POA: Diagnosis not present

## 2021-10-14 MED ORDER — PREDNISONE 20 MG PO TABS: ORAL_TABLET | ORAL | 0 refills | Status: AC

## 2021-10-14 MED ORDER — METHOCARBAMOL 500 MG PO TABS: 500.0000 mg | ORAL_TABLET | Freq: Two times a day (BID) | ORAL | 0 refills | Status: AC | PRN

## 2021-10-14 NOTE — Patient Instructions (Signed)
Nice to see you today I will be in touch in regards to your xray results Follow up in office if symptoms fail to resolve or do not improve

## 2021-10-14 NOTE — Assessment & Plan Note (Signed)
We will try a short course of methocarbamol muscle relaxers.  Did discuss sedation precautions with patient.  Start methocarbamol 500 mg twice daily as needed.

## 2021-10-14 NOTE — Progress Notes (Signed)
Acute Office Visit  Subjective:    Patient ID: Karen Osborne, female    DOB: Aug 04, 1956, 66 y.o.   MRN: [id number]  Chief Complaint  Patient presents with   Back Pain    Sx started on 10/13/21. Located in the lower right back into right buttocks area, sharp/shooting pain. Certain movements aggravate the pain. Gets better during the day. This morning pain woke patient up.Some weakness in the right leg noticed. No injury to the area. Has not tried anything for the pain yet.     Patient is in today for Back pan  Started yesterday morning and woke the patient up. States no injury or weird sleeping. Intermittent in nature. Sharp shooting from the back through her butt and stops before knee. Intermittent numbness and tingling. Subjective weakness.   Smoke approx 0.5ppd and does have a history of osteoporosis per chart   Past Medical History:  Diagnosis Date   BRCA negative 08/2015   MyRisk neg   Chronic diastolic CHF (congestive heart failure) (HCC)    COPD (chronic obstructive pulmonary disease) with emphysema ([place]) 2019   Coronary artery disease    a. s/p NSTEMI - LHC 01/27/12: prox LAD 20%, mid LAD 99%, prox OM 30%, prox RCA 30%, mid RCA 50%, dist RCA 30%, EF 60%.  PCI:  2.25 x 16 mm Promus Element DES to mid LAD //  b. Myoview (7/15):  Fixed apical defect (breast attenuation versus scar), no ischemia, EF 64%; low risk  // Myoview 7/22: EF 69, no ischemia or infarction; low risk   COVID-19 virus infection 07/2021   Rx molnupiravir -> rash so stopped early   Depression    Diabetes mellitus without complication ([place]) 7893   Family history of ovarian cancer    Glucose intolerance (impaired glucose tolerance)    A1C 6.1 01/2012   History of echocardiogram    Echo (01/28/12):  Mild LVH, EF 55%, apical septum and true apex severe HK, Gr 2 DD, mild LAE, normal RVF // Echo 7/18: mild LVH, EF 60-65, no RWMA, Gr 2 DD, normal RVF   History of nuclear stress test    Nuclear stress test 4/19: EF  74, apicoseptal defect c/w atten artifact, normal perfusion; Low Risk   Hypercholesteremia    Hypertension    Myocardial infarction ([place])    NSVD (normal spontaneous vaginal delivery) 1988   Osteopenia 2016   hip   Osteoporosis 2019   in spine and femoral neck; at [facility]   Restless leg syndrome    Tobacco abuse     Past Surgical History:  Procedure Laterality Date   ABDOMINAL HYSTERECTOMY     TAHBSO   COLONOSCOPY  10/2006   TA, [place] (Dr Vira Agar)   COLONOSCOPY WITH PROPOFOL N/A 08/19/2018   TAs, rpt 3 yrs (Tahiliani, [place] B, MD)   CORONARY ANGIOPLASTY WITH STENT PLACEMENT  01/27/2012   DES to LAD   gyn surgery  02/1998   total hysterectomy, abn pap smear, no cancer   LEFT HEART CATHETERIZATION WITH CORONARY ANGIOGRAM N/A 01/27/2012   Procedure: LEFT HEART CATHETERIZATION WITH CORONARY ANGIOGRAM;  Surgeon: Burnell Blanks, MD;  Location[facility] CATH LAB;  Service: Cardiovascular;  Laterality: N/A;   PERCUTANEOUS CORONARY STENT INTERVENTION (PCI-S)  01/27/2012   Procedure: PERCUTANEOUS CORONARY STENT INTERVENTION (PCI-S);  Surgeon: Burnell Blanks, MD;  Location: [facility] CATH LAB;  Service: Cardiovascular;;    Family History  Problem Relation Age of Onset   Prostate cancer Father  Heart disease Father 79   Hypertension Father    Heart attack Father    Kidney cancer Father    Diabetes Sister    Ovarian cancer Maternal Aunt 70   Breast cancer Neg Hx     Social History   Socioeconomic History   Marital status: Married    Spouse name: Not on file   Number of children: 1   Years of education: Not on file   Highest education level: Not on file  Occupational History   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[employer],on the floor  Tobacco Use   Smoking status: Every Day    Packs/day: 2.00    Years: 48.40    Pack years: 96.80    Types: Cigarettes   Smokeless tobacco: Never   Tobacco comments:    0.5PPD 05/14/2021  Vaping Use   Vaping Use: Never used   Substance and Sexual Activity   Alcohol use: No   Drug use: No   Sexual activity: Yes    Birth control/protection: Surgical    Comment: Hysterectomy  Other Topics Concern   Not on file  Social History Narrative   Lives with husband and son   Occ: sales (pipe fitting and valves)   Activity: some walking   Diet: good water, fruits/vegetables daily   Social Determinants of Health   Financial Resource Strain: Not on file  Food Insecurity: Not on file  Transportation Needs: Not on file  Physical Activity: Not on file  Stress: Not on file  Social Connections: Not on file  Intimate Partner Violence: Not on file    Outpatient Medications Prior to Visit  Medication Sig Dispense Refill   albuterol (VENTOLIN HFA) 108 (90 Base) MCG/ACT inhaler Inhale 2 puffs into the lungs every 6 (six) hours as needed for wheezing or shortness of breath. 8 g 6   Blood Glucose Monitoring Suppl (ONE TOUCH ULTRA 2) w/Device KIT 1 each by Does not apply route daily. Use as directed to check sugar once a day 1 each 0   carvedilol (COREG) 6.25 MG tablet TAKE 1 TABLET BY MOUTH TWICE DAILY WITH A MEAL 180 tablet 2   Cholecalciferol (VITAMIN D) 125 MCG (5000 UT) CAPS Take 1 capsule by mouth every Monday, Wednesday, and Friday.     clopidogrel (PLAVIX) 75 MG tablet TAKE 1 TABLET BY MOUTH EVERY DAY 90 tablet 3   Cyanocobalamin (B-12) 1000 MCG SUBL Place 1 tablet under the tongue every Monday, Wednesday, and Friday.     Fluticasone-Umeclidin-Vilant (TRELEGY ELLIPTA) 100-62.5-25 MCG/INH AEPB Inhale 1 puff into the lungs daily. 60 each 5   glucose blood (ONE TOUCH ULTRA TEST) test strip 1 each by Other route daily. Use as instructed to check sugar once a day. 100 each 0   hydrochlorothiazide (MICROZIDE) 12.5 MG capsule TAKE 1 CAPSULE BY MOUTH EVERY DAY 90 capsule 2   Ibuprofen-Diphenhydramine HCl (ADVIL PM) 200-25 MG CAPS Take 1 capsule by mouth at bedtime as needed.     losartan (COZAAR) 50 MG tablet Take 1 tablet (50  mg total) by mouth daily. 90 tablet 1   metFORMIN (GLUCOPHAGE-XR) 500 MG 24 hr tablet Take 2 tablets (1,000 mg total) by mouth daily. 180 tablet 3   nitroGLYCERIN (NITROSTAT) 0.4 MG SL tablet Place 1 tablet (0.4 mg total) under the tongue every 5 (five) minutes as needed for chest pain (up to 3 doses). 25 tablet 3   ONETOUCH DELICA LANCETS 54Y MISC 1 each by Does not apply  route daily. Use as directed to check sugar once a day. 100 each 0   rOPINIRole (REQUIP) 1 MG tablet Take 1-2 tablets (1-2 mg total) by mouth at bedtime. 180 tablet 2   rosuvastatin (CRESTOR) 40 MG tablet TAKE 1 TABLET BY MOUTH EVERY DAY 90 tablet 3   varenicline (CHANTIX PAK) 0.5 MG X 11 & 1 MG X 42 tablet Take one 0.5 mg tablet by mouth once daily for 3 days, then increase to one 0.5 mg tablet twice daily for 4 days, then increase to one 1 mg tablet twice daily. 53 tablet 0   nicotine (NICODERM CQ - DOSED IN MG/24 HOURS) 14 mg/24hr patch Place 1 patch (14 mg total) onto the skin daily. (Patient not taking: Reported on 07/28/2021) 28 patch 0   No facility-administered medications prior to visit.    Allergies  Allergen Reactions   Penicillins Rash    Review of Systems  Constitutional:  Negative for chills and fever.  Respiratory:  Positive for cough (baseline). Negative for shortness of breath.   Gastrointestinal:  Negative for diarrhea, nausea and vomiting.  Genitourinary:        No B&B involvement    Musculoskeletal:  Positive for back pain.  Neurological:  Positive for weakness and numbness (intermittent).      Objective:    Physical Exam Vitals and nursing note reviewed.  Constitutional:      Appearance: Normal appearance.  Cardiovascular:     Rate and Rhythm: Normal rate and regular rhythm.     Pulses:          Dorsalis pedis pulses are 1+ on the right side and 1+ on the left side.     Heart sounds: Normal heart sounds.  Pulmonary:     Effort: Pulmonary effort is normal.     Breath sounds: Normal  breath sounds.  Abdominal:     General: Bowel sounds are normal.  Musculoskeletal:        General: Tenderness present. No signs of injury.       Arms:     Lumbar back: Tenderness present. Positive right straight leg raise test. Negative left straight leg raise test.     Right lower leg: No edema.     Left lower leg: No edema.  Neurological:     General: No focal deficit present.     Mental Status: She is alert.     Motor: No weakness.     Deep Tendon Reflexes:     Reflex Scores:      Patellar reflexes are 1+ on the right side and 1+ on the left side.    Comments: Bilateral lower extremity strength 5/5    BP 108/76    Pulse 73    Temp (!) 96.8 F (36 C)    Resp 10    Ht 5' 4.5" (1.638 m)    Wt 181 lb 2 oz (82.2 kg)    SpO2 100%    BMI 30.61 kg/m  Wt Readings from Last 3 Encounters:  10/14/21 181 lb 2 oz (82.2 kg)  07/28/21 178 lb (80.7 kg)  07/28/21 178 lb (80.7 kg)    Health Maintenance Due  Topic Date Due   HIV Screening  Never done   FOOT EXAM  02/03/2019   COVID-19 Vaccine (4 - Booster for Pfizer series) 11/23/2020   OPHTHALMOLOGY EXAM  01/08/2021   INFLUENZA VACCINE  04/14/2021   HEMOGLOBIN A1C  07/23/2021   COLONOSCOPY (Pts 45-47yr Insurance coverage will need to  be confirmed)  08/19/2021    There are no preventive care reminders to display for this patient.   Lab Results  Component Value Date   TSH 1.34 01/24/2021   Lab Results  Component Value Date   WBC 9.0 01/07/2018   HGB 14.6 01/07/2018   HCT 42.5 01/07/2018   MCV 91 01/07/2018   PLT 271 01/07/2018   Lab Results  Component Value Date   NA 139 01/20/2021   K 4.9 01/20/2021   CO2 28 01/20/2021   GLUCOSE 104 (H) 01/20/2021   BUN 11 01/20/2021   CREATININE 0.92 01/20/2021   BILITOT 0.6 01/20/2021   ALKPHOS 65 01/20/2021   AST 12 01/20/2021   ALT 12 01/20/2021   PROT 6.2 01/20/2021   ALBUMIN 4.2 01/20/2021   CALCIUM 9.6 01/20/2021   GFR 65.96 01/20/2021   Lab Results  Component Value  Date   CHOL 96 01/20/2021   Lab Results  Component Value Date   HDL 34.30 (L) 01/20/2021   Lab Results  Component Value Date   LDLCALC 43 01/20/2021   Lab Results  Component Value Date   TRIG 91.0 01/20/2021   Lab Results  Component Value Date   CHOLHDL 3 01/20/2021   Lab Results  Component Value Date   HGBA1C 6.2 01/20/2021       Assessment & Plan:   Problem List Items Addressed This Visit       Nervous and Auditory   Back pain of lumbar region with sciatica - Primary    Patient having signs symptoms physical exam of right-sided sciatica.  Patient does have a history of osteoporosis will obtain lumbar picture to make sure no compression fracture has happened causing the acute change.  We will start start prednisone taper as prescribed and muscle relaxer as needed.  Patient is diabetic but last A1c was 6.2% currently maintained on metformin only.  Patient does check her sugars every other day informed patient this could raise her blood sugars.  Pending lumbar picture      Relevant Medications   predniSONE (DELTASONE) 20 MG tablet   methocarbamol (ROBAXIN) 500 MG tablet   Other Relevant Orders   DG Lumbar Spine Complete     Other   Muscle tightness    We will try a short course of methocarbamol muscle relaxers.  Did discuss sedation precautions with patient.  Start methocarbamol 500 mg twice daily as needed.      Relevant Medications   methocarbamol (ROBAXIN) 500 MG tablet     Meds ordered this encounter  Medications   predniSONE (DELTASONE) 20 MG tablet    Sig: Take 1 tablet (20 mg total) by mouth 2 (two) times daily with a meal for 3 days, THEN 1 tablet (20 mg total) daily with breakfast for 3 days. Avoid NSAID use while on medication. Avoid Ibuprofen, Motrin, Naproxen, Aleve, BC/Goody powders..    Dispense:  9 tablet    Refill:  0    Order Specific Question:   Supervising Provider    Answer:   Glori Bickers MARNE A [[id number]]   methocarbamol (ROBAXIN) 500 MG tablet     Sig: Take 1 tablet (500 mg total) by mouth 2 (two) times daily as needed for up to 10 days for muscle spasms.    Dispense:  20 tablet    Refill:  0    Order Specific Question:   Supervising Provider    Answer:   Loura Pardon A [[id number]]   This visit occurred during the  SARS-CoV-2 public health emergency.  Safety protocols were in place, including screening questions prior to the visit, additional usage of staff PPE, and extensive cleaning of exam room while observing appropriate contact time as indicated for disinfecting solutions.    Romilda Garret, NP

## 2021-10-14 NOTE — Assessment & Plan Note (Signed)
Patient having signs symptoms physical exam of right-sided sciatica.  Patient does have a history of osteoporosis will obtain lumbar picture to make sure no compression fracture has happened causing the acute change.  We will start start prednisone taper as prescribed and muscle relaxer as needed.  Patient is diabetic but last A1c was 6.2% currently maintained on metformin only.  Patient does check her sugars every other day informed patient this could raise her blood sugars.  Pending lumbar picture

## 2021-10-15 ENCOUNTER — Encounter: Payer: Self-pay | Admitting: Family Medicine

## 2021-10-15 ENCOUNTER — Other Ambulatory Visit: Payer: Self-pay | Admitting: Family Medicine

## 2021-10-15 DIAGNOSIS — I77811 Abdominal aortic ectasia: Secondary | ICD-10-CM

## 2021-10-16 ENCOUNTER — Encounter: Payer: Self-pay | Admitting: Family Medicine

## 2021-10-29 ENCOUNTER — Other Ambulatory Visit: Payer: Self-pay | Admitting: Family Medicine

## 2021-11-05 ENCOUNTER — Other Ambulatory Visit: Payer: Self-pay | Admitting: Cardiovascular Disease

## 2021-11-05 DIAGNOSIS — E7800 Pure hypercholesterolemia, unspecified: Secondary | ICD-10-CM

## 2021-11-13 ENCOUNTER — Other Ambulatory Visit: Payer: Self-pay

## 2021-11-13 ENCOUNTER — Ambulatory Visit (INDEPENDENT_AMBULATORY_CARE_PROVIDER_SITE_OTHER): Payer: 59

## 2021-11-13 ENCOUNTER — Other Ambulatory Visit: Payer: Self-pay | Admitting: Family Medicine

## 2021-11-13 DIAGNOSIS — I77811 Abdominal aortic ectasia: Secondary | ICD-10-CM | POA: Diagnosis not present

## 2021-11-13 DIAGNOSIS — I708 Atherosclerosis of other arteries: Secondary | ICD-10-CM | POA: Insufficient documentation

## 2021-11-13 DIAGNOSIS — I700 Atherosclerosis of aorta: Secondary | ICD-10-CM

## 2021-11-24 ENCOUNTER — Ambulatory Visit: Payer: 59 | Admitting: Physician Assistant

## 2021-11-28 NOTE — Telephone Encounter (Signed)
Called patient and informed her of [facility] PA's advisement. Patient will call her PCP. ?

## 2021-11-28 NOTE — Telephone Encounter (Signed)
It looks like her PCP ordered the AAA ultrasound.  ?I looked at the results and she does not have an abdominal aortic aneurysm. ?I am not sure what was mentioned to her about a referral. She should touch base with her PCP since he ordered the study. ?Richardson Dopp, PA-C    ?11/28/2021 11:26 AM   ?

## 2021-12-01 ENCOUNTER — Telehealth: Payer: Self-pay

## 2021-12-01 NOTE — Telephone Encounter (Signed)
Patient called states that she called cardiology for appointment to set up referral to vascular as directed by Dr. Darnell Level. Was told that she did not need to see them. Patient is not sure where she needs to go for referral that Dr. Darnell Level wants her to get. She also wanted to see about how to bo about changing cardiology to [place]. She is fine to stay with [facility] but wanted something closer to home.  ?

## 2021-12-01 NOTE — Telephone Encounter (Signed)
Spoke with pt asking about message.  States she'd rather go to cards in [place] vs [place]. ?

## 2021-12-03 NOTE — Telephone Encounter (Signed)
Spoke with pt relaying Ashtyn's message and provided [facility] phn #.  Pt verbalizes understanding and will give them a few days to contact her.  If they don't, she will call them.  ?

## 2021-12-25 NOTE — Progress Notes (Signed)
? ?Cardiology Office Note   ? ?Date:  12/26/2021  ? ?ID:  Karen Osborne, DOB Jan 03, 1956, MRN [id number] ? ?PCP:  [name], MD  ?Cardiologist:  Sherren Mocha, MD  ?Electrophysiologist:  None  ? ?Chief Complaint: Establish care in the [place] office ? ?History of Present Illness:  ? ?Karen Osborne is a 66 y.o. female with history of CAD with NSTEMI in 01/2012 status post PCI/DES to the mid LAD, DM2, HTN, HLD, COPD, ongoing tobacco use, aortic atherosclerosis, and PAD who presents to establish care in the [place] office. ? ?She was admitted to the hospital in 01/2012 with an NSTEMI.  LHC at that time demonstrated 20% proximal LAD stenosis, mid LAD 99% stenosis status post PCI/DES, proximal OM with 30% stenosis, proximal RCA 30% stenosis, mid RCA 50% stenosis, distal RCA 30% stenosis, and an EF of 60%.  Echo at that time demonstrated an EF of 55%, mild LVH, apical septum and true apex with hypokinesis, grade 2 diastolic dysfunction, mild left atrial argument, normal RV systolic function.  Most recent echo from 2018 showed an EF of 60 to 70%, grade 2 diastolic dysfunction, and normal wall motion.  Nuclear stress test in 12/2017 showed a tiny apical septal fixed defect that was unchanged when compared to 2015 study and felt to be likely attenuation artifact with a normal EF.  Overall, this was a low risk study. ? ?She was last seen in the [place] office in 03/2021 with continued shortness of breath which had previously been noted earlier in 2022, though stress test had to be deferred due to the patient's father's health.  She subsequently underwent nuclear stress test in 03/2021 which showed no evidence of ischemia or infarct and was overall low risk.  PFTs obtained by pulmonology in 05/2021 showed mild COPD. ? ?She underwent a lumbar spine plain film in 09/2021 which also showed aortic atherosclerosis with possible mildly ectatic aorta.  Given this, she underwent an abdominal ultrasound to evaluate her aorta  which showed diffusely calcified aortic atherosclerosis with no evidence of aortic aneurysm or ectasis.  IVC was patent.  There was estimated greater than 50% stenosis in the distal aorta and right common iliac artery with less than 50% stenosis in the left common iliac artery.  Follow-up imaging was recommended in 12 months. ? ?She comes in doing reasonably well from a cardiac perspective.  She does note a long-term history of exertional shortness of breath and fatigue as well as lower extremity weakness and cramping.  Symptoms will be present with approximately 150 steps.  Improved with rest.  No symptoms of frank chest pain.  No dizziness, recent B, or syncope.  No lower extremity swelling.  No nonhealing wounds on the lower extremities.  She has smoked for 51 years with an average of 2 to 2-1/2 packs/day, currently at 1/2 pack/day.  She knows that she needs to quit and is ready to quit, though cannot do so successfully.  She has previously tried nicotine patches and Chantix.  She is open to retrying Chantix at this time.  She is tolerating her medications without issues.  No symptoms concerning for bleeding. ? ? ?Labs independently reviewed: ?01/2021 - TSH normal, TC 96, TG 91, HDL 34, LDL 43, potassium 4.9, BUN 11, serum creatinine 0.92, albumin 4.2, AST/ALT normal, A1c 6.2 ?12/2017 - Hgb 14.6, PLT 271 ? ?Past Medical History:  ?Diagnosis Date  ? BRCA negative 08/2015  ? MyRisk neg  ? Chronic diastolic CHF (congestive heart failure) ([place])   ?  COPD (chronic obstructive pulmonary disease) with emphysema ([place]) 2019  ? Coronary artery disease   ? a. s/p NSTEMI - LHC 01/27/12: prox LAD 20%, mid LAD 99%, prox OM 30%, prox RCA 30%, mid RCA 50%, dist RCA 30%, EF 60%.  PCI:  2.25 x 16 mm Promus Element DES to mid LAD //  b. Myoview (7/15):  Fixed apical defect (breast attenuation versus scar), no ischemia, EF 64%; low risk  // Myoview 7/22: EF 69, no ischemia or infarction; low risk  ? COVID-19 virus infection 07/2021  ?  Rx molnupiravir -> rash so stopped early  ? Depression   ? Diabetes mellitus without complication ([place]) 2683  ? Family history of ovarian cancer   ? Glucose intolerance (impaired glucose tolerance)   ? A1C 6.1 01/2012  ? History of echocardiogram   ? Echo (01/28/12):  Mild LVH, EF 55%, apical septum and true apex severe HK, Gr 2 DD, mild LAE, normal RVF // Echo 7/18: mild LVH, EF 60-65, no RWMA, Gr 2 DD, normal RVF  ? History of nuclear stress test   ? Nuclear stress test 4/19: EF 74, apicoseptal defect c/w atten artifact, normal perfusion; Low Risk  ? Hypercholesteremia   ? Hypertension   ? Myocardial infarction [facility])   ? NSVD (normal spontaneous vaginal delivery) 1988  ? Osteopenia 2016  ? hip  ? Osteoporosis 2019  ? in spine and femoral neck; at [facility]  ? Restless leg syndrome   ? Tobacco abuse   ? ? ?Past Surgical History:  ?Procedure Laterality Date  ? ABDOMINAL HYSTERECTOMY    ? TAHBSO  ? COLONOSCOPY  10/2006  ? TA, HP (Dr Vira Agar)  ? COLONOSCOPY WITH PROPOFOL N/A 08/19/2018  ? TAs, rpt 3 yrs (Tahiliani, Lennette Bihari, MD)  ? CORONARY ANGIOPLASTY WITH STENT PLACEMENT  01/27/2012  ? DES to LAD  ? gyn surgery  02/1998  ? total hysterectomy, abn pap smear, no cancer  ? LEFT HEART CATHETERIZATION WITH CORONARY ANGIOGRAM N/A 01/27/2012  ? Procedure: LEFT HEART CATHETERIZATION WITH CORONARY ANGIOGRAM;  Surgeon: Burnell Blanks, MD;  Location: [facility] CATH LAB;  Service: Cardiovascular;  Laterality: N/A;  ? PERCUTANEOUS CORONARY STENT INTERVENTION (PCI-S)  01/27/2012  ? Procedure: PERCUTANEOUS CORONARY STENT INTERVENTION (PCI-S);  Surgeon: Burnell Blanks, MD;  Location: [facility] C[facility] LAB;  Service: Cardiovascular;;  ? ? ?Current Medications: ?Current Meds  ?Medication Sig  ? albuterol (VENTOLIN HFA) 108 (90 Base) MCG/ACT inhaler Inhale 2 puffs into the lungs every 6 (six) hours as needed for wheezing or shortness of breath.  ? Blood Glucose Monitoring Suppl (ONE TOUCH ULTRA 2) w/Device KIT 1 each by Does not apply route  daily. Use as directed to check sugar once a day  ? carvedilol (COREG) 6.25 MG tablet TAKE 1 TABLET BY MOUTH TWICE DAILY WITH A MEAL  ? Cholecalciferol (VITAMIN D) 125 MCG (5000 UT) CAPS Take 1 capsule by mouth every Monday, Wednesday, and Friday.  ? clopidogrel (PLAVIX) 75 MG tablet TAKE 1 TABLET BY MOUTH EVERY DAY  ? Cyanocobalamin (B-12) 1000 MCG SUBL Place 1 tablet under the tongue every Monday, Wednesday, and Friday.  ? Fluticasone-Umeclidin-Vilant (TRELEGY ELLIPTA) 100-62.5-25 MCG/INH AEPB Inhale 1 puff into the lungs daily.  ? glucose blood (ONE TOUCH ULTRA TEST) test strip 1 each by Other route daily. Use as instructed to check sugar once a day.  ? hydrochlorothiazide (MICROZIDE) 12.5 MG capsule TAKE 1 CAPSULE BY MOUTH EVERY DAY  ? Ibuprofen-Diphenhydramine HCl (ADVIL PM) 200-25 MG CAPS  Take 1 capsule by mouth at bedtime as needed.  ? losartan (COZAAR) 50 MG tablet TAKE 1 TABLET BY MOUTH EVERY DAY  ? metFORMIN (GLUCOPHAGE-XR) 500 MG 24 hr tablet Take 2 tablets (1,000 mg total) by mouth daily.  ? nitroGLYCERIN (NITROSTAT) 0.4 MG SL tablet Place 1 tablet (0.4 mg total) under the tongue every 5 (five) minutes as needed for chest pain (up to 3 doses).  ? ONETOUCH DELICA LANCETS 56D MISC 1 each by Does not apply route daily. Use as directed to check sugar once a day.  ? rOPINIRole (REQUIP) 1 MG tablet TAKE 1-2 TABLETS (1-2 MG TOTAL) BY MOUTH AT BEDTIME.  ? rosuvastatin (CRESTOR) 40 MG tablet TAKE 1 TABLET BY MOUTH EVERY DAY  ? varenicline (CHANTIX) 1 MG tablet Take 1 tablet (1 mg total) by mouth 2 (two) times daily.  ? ? ?Allergies:   Penicillins  ? ?Social History  ? ?Socioeconomic History  ? Marital status: Married  ?  Spouse name: Not on file  ? Number of children: 1  ? Years of education: Not on file  ? Highest education level: Not on file  ?Occupational History  ? 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 Smoking status: Every Day  ?  Packs/day: 2.00  ?   Years: 48.40  ?  Pack years: 96.80  ?  Types: Cigarettes  ? Smokeless tobacco: Never  ? Tobacco comments:  ?  0.5PPD 05/14/2021  ?Vaping Use  ? Vaping Use: Never used  ?Substance and Sexual Activity  ? Alcohol use

## 2021-12-26 ENCOUNTER — Ambulatory Visit (INDEPENDENT_AMBULATORY_CARE_PROVIDER_SITE_OTHER): Payer: 59 | Admitting: Physician Assistant

## 2021-12-26 ENCOUNTER — Ambulatory Visit: Payer: 59 | Admitting: Physician Assistant

## 2021-12-26 ENCOUNTER — Encounter: Payer: Self-pay | Admitting: Physician Assistant

## 2021-12-26 VITALS — BP 130/72 | HR 69 | Ht 64.5 in | Wt 186.0 lb

## 2021-12-26 DIAGNOSIS — I739 Peripheral vascular disease, unspecified: Secondary | ICD-10-CM

## 2021-12-26 DIAGNOSIS — Z720 Tobacco use: Secondary | ICD-10-CM

## 2021-12-26 DIAGNOSIS — J449 Chronic obstructive pulmonary disease, unspecified: Secondary | ICD-10-CM

## 2021-12-26 DIAGNOSIS — I2510 Atherosclerotic heart disease of native coronary artery without angina pectoris: Secondary | ICD-10-CM

## 2021-12-26 DIAGNOSIS — E785 Hyperlipidemia, unspecified: Secondary | ICD-10-CM

## 2021-12-26 DIAGNOSIS — I700 Atherosclerosis of aorta: Secondary | ICD-10-CM

## 2021-12-26 DIAGNOSIS — R0609 Other forms of dyspnea: Secondary | ICD-10-CM

## 2021-12-26 DIAGNOSIS — I10 Essential (primary) hypertension: Secondary | ICD-10-CM

## 2021-12-26 MED ORDER — VARENICLINE TARTRATE 1 MG PO TABS: 1.0000 mg | ORAL_TABLET | Freq: Two times a day (BID) | ORAL | 3 refills | Status: DC

## 2021-12-26 NOTE — Patient Instructions (Signed)
Medication Instructions:  ?Your physician has recommended you make the following change in your medication:  ? ?START Chantix 1 mg as follows: ? Days 1-3 take half a tablet once daily ? Days 4-7 take half a tablet twice a day ? Days 8 through end of treatment take whole  ? Tablet twice a day ? ?*If you need a refill on your cardiac medications before your next appointment, please call your pharmacy* ? ? ?Lab Work: ?None ? ?If you have labs (blood work) drawn today and your tests are completely normal, you will receive your results only by: ?MyChart Message (if you have MyChart) OR ?A paper copy in the mail ?If you have any lab test that is abnormal or we need to change your treatment, we will call you to review the results. ? ? ?Testing/Procedures: ?Your physician has requested that you have an echocardiogram. Echocardiography is a painless test that uses sound waves to create images of your heart. It provides your doctor with information about the size and shape of your heart and how well your heart?s chambers and valves are working. This procedure takes approximately one hour. There are no restrictions for this procedure. ? ?Your physician has requested that you have an ankle brachial index (ABI). During this test an ultrasound and blood pressure cuff are used to evaluate the arteries that supply the arms and legs with blood. Allow thirty minutes for this exam. There are no restrictions or special instructions. ? ?Your physician has requested that you have a lower extremity arterial exercise duplex. During this test, exercise and ultrasound are used to evaluate arterial blood flow in the legs. Allow one hour for this exam. There are no restrictions or special instructions. ? ? ? ?Follow-Up: ?At [facility], you and your health needs are our priority.  As part of our continuing mission to provide you with exceptional heart care, we have created designated Provider Care Teams.  These Care Teams include your  primary Cardiologist (physician) and Advanced Practice Providers (APPs -  Physician Assistants and Nurse Practitioners) who all work together to provide you with the care you need, when you need it. ? ? ? ?Your next appointment:   ?1 month(s) ? ?The format for your next appointment:   ?In Person ? ?Provider:   ?You may see Sherren Mocha, MD or one of the following Advanced Practice Providers on your designated Care Team:   ?Murray Hodgkins, NP ?Christell Faith, PA-C ?Cadence Kathlen Mody, PA-C{ ? ? ? ? ?Important Information About Sugar ? ? ? ? ?  ?

## 2022-01-01 ENCOUNTER — Other Ambulatory Visit: Payer: Self-pay | Admitting: Physician Assistant

## 2022-01-01 DIAGNOSIS — I739 Peripheral vascular disease, unspecified: Secondary | ICD-10-CM

## 2022-01-20 ENCOUNTER — Ambulatory Visit (INDEPENDENT_AMBULATORY_CARE_PROVIDER_SITE_OTHER): Payer: 59

## 2022-01-20 DIAGNOSIS — I700 Atherosclerosis of aorta: Secondary | ICD-10-CM

## 2022-01-20 DIAGNOSIS — I739 Peripheral vascular disease, unspecified: Secondary | ICD-10-CM | POA: Diagnosis not present

## 2022-01-20 DIAGNOSIS — I2510 Atherosclerotic heart disease of native coronary artery without angina pectoris: Secondary | ICD-10-CM

## 2022-01-20 LAB — ECHOCARDIOGRAM COMPLETE
AR max vel: 2.22 cm2
AV Area VTI: 2.23 cm2
AV Area mean vel: 2.18 cm2
AV Mean grad: 3 mmHg
AV Peak grad: 7.1 mmHg
Ao pk vel: 1.33 m/s
Area-P 1/2: 3.7 cm2
S' Lateral: 3.7 cm
Single Plane A4C EF: 57.9 %

## 2022-01-22 ENCOUNTER — Other Ambulatory Visit: Payer: Self-pay | Admitting: Family Medicine

## 2022-01-22 ENCOUNTER — Telehealth: Payer: Self-pay | Admitting: *Deleted

## 2022-01-22 DIAGNOSIS — E7800 Pure hypercholesterolemia, unspecified: Secondary | ICD-10-CM

## 2022-01-22 DIAGNOSIS — I739 Peripheral vascular disease, unspecified: Secondary | ICD-10-CM

## 2022-01-22 DIAGNOSIS — E538 Deficiency of other specified B group vitamins: Secondary | ICD-10-CM

## 2022-01-22 DIAGNOSIS — E1169 Type 2 diabetes mellitus with other specified complication: Secondary | ICD-10-CM

## 2022-01-22 DIAGNOSIS — M810 Age-related osteoporosis without current pathological fracture: Secondary | ICD-10-CM

## 2022-01-22 NOTE — Telephone Encounter (Signed)
Left voicemail message to call back for review of results and recommendations.  

## 2022-01-22 NOTE — Telephone Encounter (Signed)
-----   Message from Rise Mu, PA-C sent at 01/22/2022  4:02 PM EDT ----- ?Please inform the patient imaging demonstrates mild bilateral lower extremity PAD with recommendation for the patient to be evaluated by Dr. Fletcher Anon if she is agreeable.  Encourage walking regimen with continuation of clopidogrel.  Last LDL well controlled.  Complete smoking cessation is encouraged. ?

## 2022-01-22 NOTE — Progress Notes (Signed)
CPE labs ordered 

## 2022-01-22 NOTE — Telephone Encounter (Signed)
-----   Message from Rise Mu, PA-C sent at 01/20/2022  1:47 PM EDT ----- ?Echo showed normal pump function, normal wall motion, mildly thickened and slightly stiffened heart, normal right side pump function, mildly dilated left atrium, mildly leaky mitral valve, thickened aortic valve without evidence of narrowing, and normal right-sided pressure. ? ?Await vascular imaging report prior to calling patient. ?

## 2022-01-23 ENCOUNTER — Other Ambulatory Visit (INDEPENDENT_AMBULATORY_CARE_PROVIDER_SITE_OTHER): Payer: 59

## 2022-01-23 ENCOUNTER — Other Ambulatory Visit: Payer: Self-pay | Admitting: Physician Assistant

## 2022-01-23 DIAGNOSIS — M810 Age-related osteoporosis without current pathological fracture: Secondary | ICD-10-CM

## 2022-01-23 DIAGNOSIS — E7800 Pure hypercholesterolemia, unspecified: Secondary | ICD-10-CM | POA: Diagnosis not present

## 2022-01-23 DIAGNOSIS — E1169 Type 2 diabetes mellitus with other specified complication: Secondary | ICD-10-CM

## 2022-01-23 DIAGNOSIS — I739 Peripheral vascular disease, unspecified: Secondary | ICD-10-CM

## 2022-01-23 DIAGNOSIS — E538 Deficiency of other specified B group vitamins: Secondary | ICD-10-CM | POA: Diagnosis not present

## 2022-01-23 LAB — COMPREHENSIVE METABOLIC PANEL
ALT: 15 U/L (ref 0–35)
AST: 13 U/L (ref 0–37)
Albumin: 4.1 g/dL (ref 3.5–5.2)
Alkaline Phosphatase: 63 U/L (ref 39–117)
BUN: 14 mg/dL (ref 6–23)
CO2: 28 mEq/L (ref 19–32)
Calcium: 9.3 mg/dL (ref 8.4–10.5)
Chloride: 99 mEq/L (ref 96–112)
Creatinine, Ser: 0.97 mg/dL (ref 0.40–1.20)
GFR: 61.46 mL/min (ref >60.00)
Glucose, Bld: 136 mg/dL — ABNORMAL HIGH (ref 70–99)
Potassium: 4.4 mEq/L (ref 3.5–5.1)
Sodium: 135 mEq/L (ref 135–145)
Total Bilirubin: 0.5 mg/dL (ref 0.2–1.2)
Total Protein: 6.6 g/dL (ref 6.0–8.3)

## 2022-01-23 LAB — LIPID PANEL
Cholesterol: 94 mg/dL (ref 0–200)
HDL: 31.1 mg/dL — ABNORMAL LOW (ref >39.00)
LDL Cholesterol: 42 mg/dL (ref 0–99)
NonHDL: 63.04
Total CHOL/HDL Ratio: 3
Triglycerides: 105 mg/dL (ref 0.0–149.0)
VLDL: 21 mg/dL (ref 0.0–40.0)

## 2022-01-23 LAB — MICROALBUMIN / CREATININE URINE RATIO
Creatinine,U: 12.3 mg/dL
Microalb Creat Ratio: 14 mg/g (ref 0.0–30.0)
Microalb, Ur: 1.7 mg/dL (ref 0.0–1.9)

## 2022-01-23 LAB — VITAMIN B12: Vitamin B-12: 976 pg/mL — ABNORMAL HIGH (ref 211–911)

## 2022-01-23 LAB — VITAMIN D 25 HYDROXY (VIT D DEFICIENCY, FRACTURES): VITD: 52.33 ng/mL (ref 30.00–100.00)

## 2022-01-23 LAB — HEMOGLOBIN A1C: Hgb A1c MFr Bld: 6.6 % — ABNORMAL HIGH (ref 4.6–6.5)

## 2022-01-23 NOTE — Telephone Encounter (Signed)
Reviewed results and recommendations with patient. Scheduled appointment to see Dr. Fletcher Anon in June. She verbalized understanding of our conversation with no further questions at this time.  ?

## 2022-01-23 NOTE — Telephone Encounter (Signed)
Left voicemail message to call back for review of results.  

## 2022-01-27 ENCOUNTER — Other Ambulatory Visit: Payer: Self-pay | Admitting: Cardiovascular Disease

## 2022-01-27 ENCOUNTER — Other Ambulatory Visit: Payer: Self-pay | Admitting: Family Medicine

## 2022-01-27 ENCOUNTER — Other Ambulatory Visit: Payer: Self-pay | Admitting: Physician Assistant

## 2022-01-27 DIAGNOSIS — E7800 Pure hypercholesterolemia, unspecified: Secondary | ICD-10-CM

## 2022-01-28 NOTE — Progress Notes (Signed)
Office Visit    Patient Name: Karen Osborne Date of Encounter: 01/28/2022  Primary Care Provider:  [name], MD Primary Cardiologist:  Sherren Mocha, MD Primary Electrophysiologist: None  Chief Complaint    Karen Osborne is a 66 y.o. female with PMH of CAD with NSTEMI in 01/2012 status post PCI/DES to the mid LAD, DM2, HTN, HLD, COPD, ongoing tobacco use, aortic atherosclerosis, and PAD who presents today for 1 month follow-up.  Past Medical History    Past Medical History:  Diagnosis Date   BRCA negative 08/2015   MyRisk neg   Chronic diastolic CHF (congestive heart failure) (HCC)    COPD (chronic obstructive pulmonary disease) with emphysema ([place]) 2019   Coronary artery disease    a. s/p NSTEMI - LHC 01/27/12: prox LAD 20%, mid LAD 99%, prox OM 30%, prox RCA 30%, mid RCA 50%, dist RCA 30%, EF 60%.  PCI:  2.25 x 16 mm Promus Element DES to mid LAD //  b. Myoview (7/15):  Fixed apical defect (breast attenuation versus scar), no ischemia, EF 64%; low risk  // Myoview 7/22: EF 69, no ischemia or infarction; low risk   COVID-19 virus infection 07/2021   Rx molnupiravir -> rash so stopped early   Depression    Diabetes mellitus without complication ([place]) 8338   Family history of ovarian cancer    Glucose intolerance (impaired glucose tolerance)    A1C 6.1 01/2012   History of echocardiogram    Echo (01/28/12):  Mild LVH, EF 55%, apical septum and true apex severe HK, Gr 2 DD, mild LAE, normal RVF // Echo 7/18: mild LVH, EF 60-65, no RWMA, Gr 2 DD, normal RVF   History of nuclear stress test    Nuclear stress test 4/19: EF 74, apicoseptal defect c/w atten artifact, normal perfusion; Low Risk   Hypercholesteremia    Hypertension    Myocardial infarction ([place])    NSVD (normal spontaneous vaginal delivery) 1988   Osteopenia 2016   hip   Osteoporosis 2019   in spine and femoral neck; at [facility]   Restless leg syndrome    Tobacco abuse    Past Surgical History:   Procedure Laterality Date   ABDOMINAL HYSTERECTOMY     TAHBSO   COLONOSCOPY  10/2006   TA, HP (Dr Vira Agar)   COLONOSCOPY WITH PROPOFOL N/A 08/19/2018   TAs, rpt 3 yrs (Tahiliani, Francis B, MD)   CORONARY ANGIOPLASTY WITH STENT PLACEMENT  01/27/2012   DES to LAD   gyn surgery  02/1998   total hysterectomy, abn pap smear, no cancer   LEFT HEART CATHETERIZATION WITH CORONARY ANGIOGRAM N/A 01/27/2012   Procedure: LEFT HEART CATHETERIZATION WITH CORONARY ANGIOGRAM;  Surgeon: Burnell Blanks, MD;  Location: [facility] CATH LAB;  Service: Cardiovascular;  Laterality: N/A;   PERCUTANEOUS CORONARY STENT INTERVENTION (PCI-S)  01/27/2012   Procedure: PERCUTANEOUS CORONARY STENT INTERVENTION (PCI-S);  Surgeon: Burnell Blanks, MD;  Location: [facility] CATH LAB;  Service: Cardiovascular;;    Allergies  Allergies  Allergen Reactions   Penicillins Rash    History of Present Illness    Karen Osborne has a PMH of HTN, HLD, tobacco abuse, COPD, PAD, CAD s/p LHC on 01/2012 for NSTEMI showing mid LAD 99% occlusion treated with Promus DES x1. 2D echo in 01/2012 with mild LVH and EF 55%, severe HK of apical septum and apex, grade 2 DD with trivial MR, mild LAE. ETT performed 7/15 revealing small fixed apical defect with  no ischemia. 2D echo on 03/2017 with mild LVH, EF 09-32, grade 2 diastolic dysfunction, normal wall motion. Lexiscan stress test on 12/2017 revealed EF 74, with apical septal fixed defect unchanged from 2015. Repeat nuclear stress test on 03/2021 that showed no evidence of ischemia or infarct and overall low risk.  Karen Osborne was last seen in office by Christell Faith, PA on 12/26/2021 to establish care and [place]. She reported during visit doing reasonably well from the cardiac perspective. She has a long history of exertional dyspnea and lower extremity weakness with cramping. She had recently underwent abdominal ultrasound that showed diffusely calcified atherosclerosis without aneurysm. Chest also  revealed 50% stenosis and right and left common iliac. She suffers from COPD and is a current smoker and was restarted on Chantix during appointment. 2D echo obtained on 01/2022 showed EF 55-60% with normal LV function, no RWMA, mild LVH, grade 1 DD and dilated LA with no AV regurgitation.  LE ultrasound/ABI revealed mild to moderate     Since last being seen in the office patient reports that she is doing well and has no new cardiac complaints.  She is unfortunately still smoking and has not yet started her Chantix.  We discussed the importance of smoking cessation and the positive effects of managing her PAD and CAD. She denies chest pain, palpitations, dyspnea, PND, orthopnea, nausea, vomiting, dizziness, syncope, edema, weight gain, or early satiety.  Home Medications    Current Outpatient Medications  Medication Sig Dispense Refill   albuterol (VENTOLIN HFA) 108 (90 Base) MCG/ACT inhaler Inhale 2 puffs into the lungs every 6 (six) hours as needed for wheezing or shortness of breath. 8 g 6   Blood Glucose Monitoring Suppl (ONE TOUCH ULTRA 2) w/Device KIT 1 each by Does not apply route daily. Use as directed to check sugar once a day 1 each 0   carvedilol (COREG) 6.25 MG tablet TAKE 1 TABLET BY MOUTH TWICE DAILY WITH A MEAL 180 tablet 2   Cholecalciferol (VITAMIN D) 125 MCG (5000 UT) CAPS Take 1 capsule by mouth every Monday, Wednesday, and Friday.     clopidogrel (PLAVIX) 75 MG tablet TAKE 1 TABLET BY MOUTH EVERY DAY 90 tablet 3   Cyanocobalamin (B-12) 1000 MCG SUBL Place 1 tablet under the tongue every Monday, Wednesday, and Friday.     Fluticasone-Umeclidin-Vilant (TRELEGY ELLIPTA) 100-62.5-25 MCG/INH AEPB Inhale 1 puff into the lungs daily. 60 each 5   glucose blood (ONE TOUCH ULTRA TEST) test strip 1 each by Other route daily. Use as instructed to check sugar once a day. 100 each 0   hydrochlorothiazide (MICROZIDE) 12.5 MG capsule TAKE 1 CAPSULE BY MOUTH EVERY DAY 90 capsule 2    Ibuprofen-Diphenhydramine HCl (ADVIL PM) 200-25 MG CAPS Take 1 capsule by mouth at bedtime as needed.     losartan (COZAAR) 50 MG tablet TAKE 1 TABLET BY MOUTH EVERY DAY 90 tablet 1   metFORMIN (GLUCOPHAGE-XR) 500 MG 24 hr tablet TAKE 2 TABLETS BY MOUTH EVERY DAY 180 tablet 0   nicotine (NICODERM CQ - DOSED IN MG/24 HOURS) 14 mg/24hr patch Place 1 patch (14 mg total) onto the skin daily. (Patient not taking: Reported on 12/26/2021) 28 patch 0   nitroGLYCERIN (NITROSTAT) 0.4 MG SL tablet Place 1 tablet (0.4 mg total) under the tongue every 5 (five) minutes as needed for chest pain (up to 3 doses). 25 tablet 3   ONETOUCH DELICA LANCETS 35T MISC 1 each by Does not apply route daily. Use  as directed to check sugar once a day. 100 each 0   rOPINIRole (REQUIP) 1 MG tablet TAKE 1-2 TABLETS (1-2 MG TOTAL) BY MOUTH AT BEDTIME. 180 tablet 2   rosuvastatin (CRESTOR) 40 MG tablet TAKE 1 TABLET BY MOUTH EVERY DAY 90 tablet 0   varenicline (CHANTIX) 1 MG tablet Take 1 tablet (1 mg total) by mouth 2 (two) times daily. 60 tablet 3   No current facility-administered medications for this visit.     Review of Systems  Please see the history of present illness.    (+) Bilateral hip pain   All other systems reviewed and are otherwise negative except as noted above.  Physical Exam    Wt Readings from Last 3 Encounters:  12/26/21 186 lb (84.4 kg)  10/14/21 181 lb 2 oz (82.2 kg)  07/28/21 178 lb (80.7 kg)   SE:GBTDV were no vitals filed for this visit.,There is no height or weight on file to calculate BMI.  Constitutional:      Appearance: Healthy appearance. Not in distress.  Neck:     Vascular: JVD normal.  Pulmonary:     Effort: Pulmonary effort is normal.     Breath sounds: No wheezing. No rales. Diminished in the bases Cardiovascular:     Normal rate. Regular rhythm. Normal S1. Normal S2.      Murmurs: There is no murmur.  Edema:    Peripheral edema absent.  Abdominal:     Palpations: Abdomen  is soft non tender. There is no hepatomegaly.  Skin:    General: Skin is warm and dry.  Neurological:     General: No focal deficit present.     Mental Status: Alert and oriented to person, place and time.     Cranial Nerves: Cranial nerves are intact.  EKG/LABS/Other Studies Reviewed    ECG personally reviewed by me today -none completed today  Lab Results  Component Value Date   WBC 9.0 01/07/2018   HGB 14.6 01/07/2018   HCT 42.5 01/07/2018   MCV 91 01/07/2018   PLT 271 01/07/2018   Lab Results  Component Value Date   CREATININE 0.97 01/23/2022   BUN 14 01/23/2022   NA 135 01/23/2022   K 4.4 01/23/2022   CL 99 01/23/2022   CO2 28 01/23/2022   Lab Results  Component Value Date   ALT 15 01/23/2022   AST 13 01/23/2022   ALKPHOS 63 01/23/2022   BILITOT 0.5 01/23/2022   Lab Results  Component Value Date   CHOL 94 01/23/2022   HDL 31.10 (L) 01/23/2022   LDLCALC 42 01/23/2022   TRIG 105.0 01/23/2022   CHOLHDL 3 01/23/2022    Lab Results  Component Value Date   HGBA1C 6.6 (H) 01/23/2022    Assessment & Plan    1.  Coronary artery disease: -s/p NSTEMI in 01/2012 status post PCI/DES to the mid LAD. -She denies any anginal symptoms or chest pain today and states that her shortness of breath is much better. -GDMT consist of Plavix 75 mg, Crestor 40 mg, carvedilol 6.25 mg twice daily. -We will add aspirin 81 mg to maximize GDMT   2.  Peripheral artery disease: -She denies any claudication symptoms today -Patient instructed to begin walking regimen and also to start 81 mg aspirin daily. -She has appointment scheduled with Dr. Fletcher Anon in June to discuss PAD and neck steps in treatment plan.  3.  Hypertension: -Blood pressure today was 130/70 well-controlled and at goal of 130/80 -Continue current  antihypertensive regimen -Patient encouraged to quit smoking and follow low-sodium heart healthy diet  4.  Hyperlipidemia: -Continue Crestor 40 mg daily -Last lipid panel  was 01/23/2022 with LDL of 42 at goal of less than 70  5.  COPD: -Followed by pulmonology -Continue current therapy per pulmonology   6.  Tobacco abuse: -Patient is unfortunately still smoking and has been advised of the importance of cessation and management of comorbidities. -Patient agreeable to start Chantix however she had to begin therapy   Disposition: Follow-up per schedule    Medication Adjustments/Labs and Tests Ordered: Current medicines are reviewed at length with the patient today.  Concerns regarding medicines are outlined above.   Signed, Mable Fill, Marissa Nestle, NP 01/28/2022, 5:19 PM [place]

## 2022-01-30 ENCOUNTER — Ambulatory Visit (INDEPENDENT_AMBULATORY_CARE_PROVIDER_SITE_OTHER): Payer: 59 | Admitting: Family Medicine

## 2022-01-30 ENCOUNTER — Ambulatory Visit (INDEPENDENT_AMBULATORY_CARE_PROVIDER_SITE_OTHER): Payer: 59 | Admitting: Nurse Practitioner

## 2022-01-30 ENCOUNTER — Encounter: Payer: Self-pay | Admitting: Family Medicine

## 2022-01-30 ENCOUNTER — Encounter: Payer: Self-pay | Admitting: Physician Assistant

## 2022-01-30 VITALS — BP 130/70 | HR 74 | Ht 64.0 in | Wt 187.0 lb

## 2022-01-30 VITALS — BP 126/72 | HR 70 | Temp 97.7°F | Ht 64.25 in | Wt 186.2 lb

## 2022-01-30 DIAGNOSIS — I2510 Atherosclerotic heart disease of native coronary artery without angina pectoris: Secondary | ICD-10-CM

## 2022-01-30 DIAGNOSIS — Z0000 Encounter for general adult medical examination without abnormal findings: Secondary | ICD-10-CM | POA: Insufficient documentation

## 2022-01-30 DIAGNOSIS — E538 Deficiency of other specified B group vitamins: Secondary | ICD-10-CM

## 2022-01-30 DIAGNOSIS — I700 Atherosclerosis of aorta: Secondary | ICD-10-CM

## 2022-01-30 DIAGNOSIS — I10 Essential (primary) hypertension: Secondary | ICD-10-CM | POA: Diagnosis not present

## 2022-01-30 DIAGNOSIS — M810 Age-related osteoporosis without current pathological fracture: Secondary | ICD-10-CM

## 2022-01-30 DIAGNOSIS — I739 Peripheral vascular disease, unspecified: Secondary | ICD-10-CM

## 2022-01-30 DIAGNOSIS — J449 Chronic obstructive pulmonary disease, unspecified: Secondary | ICD-10-CM

## 2022-01-30 DIAGNOSIS — G2581 Restless legs syndrome: Secondary | ICD-10-CM

## 2022-01-30 DIAGNOSIS — F17200 Nicotine dependence, unspecified, uncomplicated: Secondary | ICD-10-CM

## 2022-01-30 DIAGNOSIS — E7800 Pure hypercholesterolemia, unspecified: Secondary | ICD-10-CM

## 2022-01-30 DIAGNOSIS — E1169 Type 2 diabetes mellitus with other specified complication: Secondary | ICD-10-CM

## 2022-01-30 DIAGNOSIS — I6529 Occlusion and stenosis of unspecified carotid artery: Secondary | ICD-10-CM | POA: Insufficient documentation

## 2022-01-30 DIAGNOSIS — R0989 Other specified symptoms and signs involving the circulatory and respiratory systems: Secondary | ICD-10-CM

## 2022-01-30 DIAGNOSIS — E785 Hyperlipidemia, unspecified: Secondary | ICD-10-CM

## 2022-01-30 DIAGNOSIS — Z7189 Other specified counseling: Secondary | ICD-10-CM | POA: Insufficient documentation

## 2022-01-30 DIAGNOSIS — M791 Myalgia, unspecified site: Secondary | ICD-10-CM

## 2022-01-30 DIAGNOSIS — I77811 Abdominal aortic ectasia: Secondary | ICD-10-CM

## 2022-01-30 DIAGNOSIS — F418 Other specified anxiety disorders: Secondary | ICD-10-CM

## 2022-01-30 MED ORDER — COENZYME Q10 100 MG PO CAPS
100.0000 mg | ORAL_CAPSULE | Freq: Every day | ORAL | Status: DC
Start: 2022-01-30 — End: 2023-02-16

## 2022-01-30 MED ORDER — ASPIRIN 81 MG PO TBEC: 81.0000 mg | DELAYED_RELEASE_TABLET | Freq: Every day | ORAL | Status: AC

## 2022-01-30 MED ORDER — LOSARTAN POTASSIUM 50 MG PO TABS: 50.0000 mg | ORAL_TABLET | Freq: Every day | ORAL | 3 refills | Status: DC

## 2022-01-30 MED ORDER — METFORMIN HCL ER 500 MG PO TB24: 1000.0000 mg | ORAL_TABLET | Freq: Every day | ORAL | 3 refills | Status: DC

## 2022-01-30 NOTE — Patient Instructions (Addendum)
Try Coenzyme Q10 over the counter supplement '100mg'$  daily.  You may call Dr Michele Mcalpine office [facility] at [phone] to ask about colonoscopy as I think you're due.  Call [facility] imaging at your convenience to schedule bone density scan.  [facility] in [place] [phone]  Advanced directive packet provided today.  Work on quitting smoking, let us know if any trouble with this.  Return as needed or in 6 months for diabetes follow up visit.   Health Maintenance After Age 38 After age 42, you are at a higher risk for certain long-term diseases and infections as well as injuries from falls. Falls are a major cause of broken bones and head injuries in people who are older than age 49. Getting regular preventive care can help to keep you healthy and well. Preventive care includes getting regular testing and making lifestyle changes as recommended by your health care provider. Talk with your health care provider about: Which screenings and tests you should have. A screening is a test that checks for a disease when you have no symptoms. A diet and exercise plan that is right for you. What should I know about screenings and tests to prevent falls? Screening and testing are the best ways to find a health problem early. Early diagnosis and treatment give you the best chance of managing medical conditions that are common after age 6. Certain conditions and lifestyle choices may make you more likely to have a fall. Your health care provider may recommend: Regular vision checks. Poor vision and conditions such as cataracts can make you more likely to have a fall. If you wear glasses, make sure to get your prescription updated if your vision changes. Medicine review. Work with your health care provider to regularly review all of the medicines you are taking, including over-the-counter medicines. Ask your health care provider about any side effects that may make you more likely to have a fall. Tell your  health care provider if any medicines that you take make you feel dizzy or sleepy. Strength and balance checks. Your health care provider may recommend certain tests to check your strength and balance while standing, walking, or changing positions. Foot health exam. Foot pain and numbness, as well as not wearing proper footwear, can make you more likely to have a fall. Screenings, including: Osteoporosis screening. Osteoporosis is a condition that causes the bones to get weaker and break more easily. Blood pressure screening. Blood pressure changes and medicines to control blood pressure can make you feel dizzy. Depression screening. You may be more likely to have a fall if you have a fear of falling, feel depressed, or feel unable to do activities that you used to do. Alcohol use screening. Using too much alcohol can affect your balance and may make you more likely to have a fall. Follow these instructions at home: Lifestyle Do not drink alcohol if: Your health care provider tells you not to drink. If you drink alcohol: Limit how much you have to: 0-1 drink a day for women. 0-2 drinks a day for men. Know how much alcohol is in your drink. In the [place] one drink equals one 12 oz bottle of beer (355 mL), one 5 oz glass of wine (148 mL), or one 1 oz glass of hard liquor (44 mL). Do not use any products that contain nicotine or tobacco. These products include cigarettes, chewing tobacco, and vaping devices, such as e-cigarettes. If you need help quitting, ask your health care provider.  Activity  Follow a regular exercise program to stay fit. This will help you maintain your balance. Ask your health care provider what types of exercise are appropriate for you. If you need a cane or walker, use it as recommended by your health care provider. Wear supportive shoes that have nonskid soles. Safety  Remove any tripping hazards, such as rugs, cords, and clutter. Install safety equipment such as grab  bars in bathrooms and safety rails on stairs. Keep rooms and walkways well-lit. General instructions Talk with your health care provider about your risks for falling. Tell your health care provider if: You fall. Be sure to tell your health care provider about all falls, even ones that seem minor. You feel dizzy, tiredness (fatigue), or off-balance. Take over-the-counter and prescription medicines only as told by your health care provider. These include supplements. Eat a healthy diet and maintain a healthy weight. A healthy diet includes low-fat dairy products, low-fat (lean) meats, and fiber from whole grains, beans, and lots of fruits and vegetables. Stay current with your vaccines. Schedule regular health, dental, and eye exams. Summary Having a healthy lifestyle and getting preventive care can help to protect your health and wellness after age 30. Screening and testing are the best way to find a health problem early and help you avoid having a fall. Early diagnosis and treatment give you the best chance for managing medical conditions that are more common for people who are older than age 68. Falls are a major cause of broken bones and head injuries in people who are older than age 58. Take precautions to prevent a fall at home. Work with your health care provider to learn what changes you can make to improve your health and wellness and to prevent falls. This information is not intended to replace advice given to you by your health care provider. Make sure you discuss any questions you have with your health care provider. Document Revised: 01/20/2021 Document Reviewed: 01/20/2021 Elsevier Patient Education  [place].

## 2022-01-30 NOTE — Progress Notes (Addendum)
Patient ID: Karen Osborne, female    DOB: [birth year], 66 y.o.   MRN: [id number]  This visit was conducted in person.  BP 126/72   Pulse 70   Temp 97.7 F (36.5 C) (Temporal)   Ht 5' 4.25" (1.632 m)   Wt 186 lb 4 oz (84.5 kg)   SpO2 98%   BMI 31.72 kg/m    CC: CPE Subjective:   HPI: Karen Osborne is a 66 y.o. female presenting on 01/30/2022 for Annual Exam   New medicare March 2023?   Hearing Screening   500Hz  1000Hz  2000Hz  4000Hz   Right ear 25 25 20 25   Left ear 20 20 20  40   Vision Screening   Right eye Left eye Both eyes  Without correction     With correction 20/25 20/30 20/25     Flowsheet Row Office Visit from 01/30/2022 in [place] [facility] [place]  PHQ-2 Total Score 2          01/30/2022    7:32 AM 02/04/2018    3:34 PM  [place] in the past year? 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  Known CAD s/p NSTEMI 2013 with DES to LAD. Myoview 2019 low risk. Continues plavix, carvedilol, rosuvastatin.  DM - on metformin XR 520m bid with good control. OSA - doesn't regularly use this.   RLS - takes requip 147mand drinks water. Some nausea to medicine. Night time symptoms overall well controlled. Feels water intake increase has improved symptoms. Tried copper knee brace with benefit.    Myalgias - describes anterior thigh pain whenever she sits during the day. She is managing with ibuprofen.   Notes progressive hip burning pain worse with prolonged walking or standing.   Preventative: COLONOSCOPY WITH PROPOFOL 08/19/2018 - TAs, rpt 3 yrs (TBonna GainsVaLennette BihariMD) - DUE Mammo 06/2021 - Birads2 @ [facility]  Well woman exam with [facility] - s/p TAH w/ BSO - sees AlFraser DinA every 1-2 yrs.  Lung cancer screening - undergoing, latest 07/2021, started 06/2018 DEXA scan - overdue - did not complete last year. Will forward request to referral coordinators.  Flu shot - yearly Tetanus shot - 2002, Tdap  01/2021 COVID vaccine Pfizer 11/2019, 12/2019, booster 09/2020 Pneumovax 2013, Prevnar-20 due - forgot to provide Shingrix - 01/2020, 01/2021 Advanced directive discussion: does not have. Packet provided today. Unsure who HCPOA would be, probably sister Karen MassonUnsure plan.  Seat belt use discussed  Sunscreen use discussed. No changing moles on skin. Saw  derm 08/2021.  Current smoker <1/2 ppd, hasn't started chantix yet.  Alcohol - none  Dentist - full dentures placed 08/2020  Eye exam - yearly  Bowel - no consitpation  Bladder - no incontinence   Lives with husband and son Occ: sales (pipe fitting and valves) Activity: some walking Diet: good water, fruits/vegetables daily     Relevant past medical, surgical, family and social history reviewed and updated as indicated. Interim medical history since our last visit reviewed. Allergies and medications reviewed and updated. Outpatient Medications Prior to Visit  Medication Sig Dispense Refill   albuterol (VENTOLIN HFA) 108 (90 Base) MCG/ACT inhaler Inhale 2 puffs into the lungs every 6 (six) hours as needed for wheezing or shortness of breath. 8 g 6   Blood Glucose Monitoring Suppl (ONE TOUCH ULTRA 2) w/Device KIT 1 each by Does not apply route daily. Use as directed to  check sugar once a day 1 each 0   carvedilol (COREG) 6.25 MG tablet TAKE 1 TABLET BY MOUTH TWICE DAILY WITH A MEAL 180 tablet 2   Cholecalciferol (VITAMIN D) 125 MCG (5000 UT) CAPS Take 1 capsule by mouth every Monday, Wednesday, and Friday.     clopidogrel (PLAVIX) 75 MG tablet TAKE 1 TABLET BY MOUTH EVERY DAY 90 tablet 3   Cyanocobalamin (B-12) 1000 MCG SUBL Place 1 tablet under the tongue every Monday, Wednesday, and Friday.     Fluticasone-Umeclidin-Vilant (TRELEGY ELLIPTA) 100-62.5-25 MCG/INH AEPB Inhale 1 puff into the lungs daily. 60 each 5   glucose blood (ONE TOUCH ULTRA TEST) test strip 1 each by Other route daily. Use as instructed to check sugar once a  day. 100 each 0   hydrochlorothiazide (MICROZIDE) 12.5 MG capsule TAKE 1 CAPSULE BY MOUTH EVERY DAY 90 capsule 2   nicotine (NICODERM CQ - DOSED IN MG/24 HOURS) 14 mg/24hr patch Place 1 patch (14 mg total) onto the skin daily. (Patient not taking: Reported on 01/30/2022) 28 patch 0   nitroGLYCERIN (NITROSTAT) 0.4 MG SL tablet Place 1 tablet (0.4 mg total) under the tongue every 5 (five) minutes as needed for chest pain (up to 3 doses). 25 tablet 3   ONETOUCH DELICA LANCETS 69G MISC 1 each by Does not apply route daily. Use as directed to check sugar once a day. 100 each 0   rOPINIRole (REQUIP) 1 MG tablet TAKE 1-2 TABLETS (1-2 MG TOTAL) BY MOUTH AT BEDTIME. 180 tablet 2   rosuvastatin (CRESTOR) 40 MG tablet TAKE 1 TABLET BY MOUTH EVERY DAY 90 tablet 0   varenicline (CHANTIX) 1 MG tablet Take 1 tablet (1 mg total) by mouth 2 (two) times daily. (Patient not taking: Reported on 01/30/2022) 60 tablet 3   Ibuprofen-Diphenhydramine HCl (ADVIL PM) 200-25 MG CAPS Take 1 capsule by mouth at bedtime as needed.     losartan (COZAAR) 50 MG tablet TAKE 1 TABLET BY MOUTH EVERY DAY 90 tablet 1   metFORMIN (GLUCOPHAGE-XR) 500 MG 24 hr tablet TAKE 2 TABLETS BY MOUTH EVERY DAY 180 tablet 0   No facility-administered medications prior to visit.     Per HPI unless specifically indicated in ROS section below Review of Systems  Objective:  BP 126/72   Pulse 70   Temp 97.7 F (36.5 C) (Temporal)   Ht 5' 4.25" (1.632 m)   Wt 186 lb 4 oz (84.5 kg)   SpO2 98%   BMI 31.72 kg/m   Wt Readings from Last 3 Encounters:  01/30/22 187 lb (84.8 kg)  01/30/22 186 lb 4 oz (84.5 kg)  12/26/21 186 lb (84.4 kg)      Physical Exam Vitals and nursing note reviewed.  Constitutional:      Appearance: Normal appearance. She is not ill-appearing.  HENT:     Head: Normocephalic and atraumatic.     Right Ear: Tympanic membrane, ear canal and external ear normal. There is no impacted cerumen.     Left Ear: Tympanic membrane,  ear canal and external ear normal. There is no impacted cerumen.  Eyes:     General:        Right eye: No discharge.        Left eye: No discharge.     Extraocular Movements: Extraocular movements intact.     Conjunctiva/sclera: Conjunctivae normal.     Pupils: Pupils are equal, round, and reactive to light.  Neck:     Thyroid: No thyroid  mass or thyromegaly.     Vascular: Carotid bruit (mild left sided) present.  Cardiovascular:     Rate and Rhythm: Normal rate and regular rhythm.     Pulses: Normal pulses.     Heart sounds: Normal heart sounds. No murmur heard. Pulmonary:     Effort: Pulmonary effort is normal. No respiratory distress.     Breath sounds: Normal breath sounds. No wheezing, rhonchi or rales.  Abdominal:     General: Bowel sounds are normal. There is no distension.     Palpations: Abdomen is soft. There is no mass.     Tenderness: There is no abdominal tenderness. There is no guarding or rebound.     Hernia: No hernia is present.  Musculoskeletal:     Cervical back: Normal range of motion and neck supple. No rigidity.     Right lower leg: No edema.     Left lower leg: No edema.  Lymphadenopathy:     Cervical: No cervical adenopathy.  Skin:    General: Skin is warm and dry.     Findings: No rash.  Neurological:     General: No focal deficit present.     Mental Status: She is alert. Mental status is at baseline.     Comments:  Recall 3/3 Calculation 5/5 DLROW  Psychiatric:        Mood and Affect: Mood normal.        Behavior: Behavior normal.      Results for orders placed or performed in visit on 01/23/22  VITAMIN D 25 Hydroxy (Vit-D Deficiency, Fractures)  Result Value Ref Range   VITD 52.33 30.00 - 100.00 ng/mL  Vitamin B12  Result Value Ref Range   Vitamin B-12 976 (H) 211 - 911 pg/mL  Lipid panel  Result Value Ref Range   Cholesterol 94 0 - 200 mg/dL   Triglycerides 105.0 0.0 - 149.0 mg/dL   HDL 31.10 (L) >39.00 mg/dL   VLDL 21.0 0.0 - 40.0  mg/dL   LDL Cholesterol 42 0 - 99 mg/dL   Total CHOL/HDL Ratio 3    NonHDL 63.04   Comprehensive metabolic panel  Result Value Ref Range   Sodium 135 135 - 145 mEq/L   Potassium 4.4 3.5 - 5.1 mEq/L   Chloride 99 96 - 112 mEq/L   CO2 28 19 - 32 mEq/L   Glucose, Bld 136 (H) 70 - 99 mg/dL   BUN 14 6 - 23 mg/dL   Creatinine, Ser 0.97 0.40 - 1.20 mg/dL   Total Bilirubin 0.5 0.2 - 1.2 mg/dL   Alkaline Phosphatase 63 39 - 117 U/L   AST 13 0 - 37 U/L   ALT 15 0 - 35 U/L   Total Protein 6.6 6.0 - 8.3 g/dL   Albumin 4.1 3.5 - 5.2 g/dL   GFR 61.46 >60.00 mL/min   Calcium 9.3 8.4 - 10.5 mg/dL  Hemoglobin A1c  Result Value Ref Range   Hgb A1c MFr Bld 6.6 (H) 4.6 - 6.5 %  Microalbumin / creatinine urine ratio  Result Value Ref Range   Microalb, Ur 1.7 0.0 - 1.9 mg/dL   Creatinine,U 12.3 mg/dL   Microalb Creat Ratio 14.0 0.0 - 30.0 mg/g    Assessment & Plan:   Problem List Items Addressed This Visit     Health maintenance examination - Primary (Chronic)    Preventative protocols reviewed and updated unless pt declined. Discussed healthy diet and lifestyle.        Advanced  directives, counseling/discussion (Chronic)    Advanced directive discussion: does not have. Packet provided today. Unsure who HCPOA would be, probably sister Karen Osborne. Unsure plan.       HLD (hyperlipidemia)    Chronic, stable on high potency crestor. Noting some myalgias, see below. The ASCVD Risk score (Arnett DK, et al., 2019) failed to calculate for the following reasons:   The valid total cholesterol range is 130 to 320 mg/dL       Relevant Medications   losartan (COZAAR) 50 MG tablet   TOBACCO ABUSE    Continued under half a pack a day smoker, encourage cessation.  Contemplative.  She has Chantix prescription at home.       RESTLESS LEG SYNDROME    Nighttime symptoms overall well managed with Requip 1 mg nightly. See below for myalgias symptoms during the day.       Essential hypertension     Chronic, stable continue current regimen.       Relevant Medications   losartan (COZAAR) 50 MG tablet   Osteoporosis    Overdue for recheck-new referral placed, and I abided her with [facility] imaging phone number to call and schedule DEXA.  She is not currently on osteoporosis medication.  Avoid calcium supplementation in history of CAD/PAD.       Relevant Orders   DG Bone Density   Depression with anxiety    Stable period off medication       CAD (coronary artery disease)   Relevant Medications   losartan (COZAAR) 50 MG tablet   Type 2 diabetes mellitus with other specified complication (HCC)    Chronic, stable on metformin XR 1000 mg daily. She is overdue for diabetic eye exam-asked to call and schedule.       Relevant Medications   losartan (COZAAR) 50 MG tablet   metFORMIN (GLUCOPHAGE-XR) 500 MG 24 hr tablet   COPD (chronic obstructive pulmonary disease) (HCC)    Stable, off controller medication.  She sees pulmonology regularly.       Thoracic aorta atherosclerosis (HCC)    Continue statin, Plavix.       Relevant Medications   losartan (COZAAR) 50 MG tablet   Vitamin B12 deficiency    Continue Monday Wednesday Friday replacement.       Abdominal aortic ectasia (HCC)   Relevant Medications   losartan (COZAAR) 50 MG tablet   Aorto-iliac atherosclerosis (HCC)   Relevant Medications   losartan (COZAAR) 50 MG tablet   Myalgia    Describes anterior thigh pains at rest as well as bilateral burning hip pain with any prolonged standing or walking. ? statin induced myalgia.  Recommend trial of coenzyme Q 1000 mg daily and consideration for trial of lower statin dosage. Recent ABI showing mild PAD with abnormal TBI's bilaterally as well.  She has follow-up planned with cardiology this afternoon and appointment with Dr. Fletcher Anon next month.       Left carotid bruit    Check carotid ultrasound.       Relevant Orders   VAS US CAROTID     Meds ordered this  encounter  Medications   Coenzyme Q10 100 MG capsule    Sig: Take 1 capsule (100 mg total) by mouth daily.   losartan (COZAAR) 50 MG tablet    Sig: Take 1 tablet (50 mg total) by mouth daily.    Dispense:  90 tablet    Refill:  3   metFORMIN (GLUCOPHAGE-XR) 500 MG 24 hr tablet  Sig: Take 2 tablets (1,000 mg total) by mouth daily.    Dispense:  180 tablet    Refill:  3   Orders Placed This Encounter  Procedures   DG Bone Density    Standing Status:   Future    Standing Expiration Date:   01/31/2023    Order Specific Question:   Reason for Exam (SYMPTOM  OR DIAGNOSIS REQUIRED)    Answer:   f/u OP    Order Specific Question:   Preferred imaging location?    Answer:   External    Patient instructions: Try Coenzyme Q10 over the counter supplement 149m daily.  You may call Dr TMichele Mcalpineoffice [facility] at [phone] ask about colonoscopy as I think you're due.  [facility].  [facility] in [phone] Advanced directive packet provided today.  Work on quitting smoking, let uKoreaknow if any trouble with this.  Return as needed or in 6 months for diabetes follow up visit.   Follow up plan: Return in about 6 months (around 08/02/2022), or if symptoms worsen or fail to improve, for follow up visit.  JRia Bush MD

## 2022-01-30 NOTE — Assessment & Plan Note (Signed)
Stable period off medication.  

## 2022-01-30 NOTE — Assessment & Plan Note (Signed)
I have personally reviewed the Medicare Annual Wellness questionnaire and have noted 1. The patient's medical and social history 2. Their use of alcohol, tobacco or illicit drugs 3. Their current medications and supplements 4. The patient's functional ability including ADL's, fall risks, home safety risks and hearing or visual impairment. Cognitive function has been assessed and addressed as indicated.  5. Diet and physical activity 6. Evidence for depression or mood disorders The patients weight, height, BMI have been recorded in the chart. I have made referrals, counseling and provided education to the patient based on review of the above and I have provided the pt with a written personalized care plan for preventive services. Provider list updated.. See scanned questionairre as needed for further documentation. Reviewed preventative protocols and updated unless pt declined.  EKG not performed as she regularly sees cardiology.

## 2022-01-30 NOTE — Assessment & Plan Note (Addendum)
Overdue for recheck-new referral placed, and I abided her with [facility] imaging phone number to call and schedule DEXA.  She is not currently on osteoporosis medication.  Avoid calcium supplementation in history of CAD/PAD.

## 2022-01-30 NOTE — Assessment & Plan Note (Signed)
Continued under half a pack a day smoker, encourage cessation.  Contemplative.  She has Chantix prescription at home.

## 2022-01-30 NOTE — Assessment & Plan Note (Signed)
Continue statin, Plavix.

## 2022-01-30 NOTE — Assessment & Plan Note (Signed)
Nighttime symptoms overall well managed with Requip 1 mg nightly. See below for myalgias symptoms during the day.

## 2022-01-30 NOTE — Assessment & Plan Note (Signed)
Chronic, stable continue current regimen.  

## 2022-01-30 NOTE — Assessment & Plan Note (Signed)
Chronic, stable on high potency crestor. Noting some myalgias, see below. The ASCVD Risk score (Arnett DK, et al., 2019) failed to calculate for the following reasons:   The valid total cholesterol range is 130 to 320 mg/dL

## 2022-01-30 NOTE — Assessment & Plan Note (Signed)
Check carotid ultrasound.  

## 2022-01-30 NOTE — Assessment & Plan Note (Signed)
Advanced directive discussion: does not have. Packet provided today. Unsure who HCPOA would be, probably sister Vicente Masson. Unsure plan.

## 2022-01-30 NOTE — Assessment & Plan Note (Signed)
Describes anterior thigh pains at rest as well as bilateral burning hip pain with any prolonged standing or walking. ? statin induced myalgia.  Recommend trial of coenzyme Q 1000 mg daily and consideration for trial of lower statin dosage. Recent ABI showing mild PAD with abnormal TBI's bilaterally as well.  She has follow-up planned with cardiology this afternoon and appointment with Dr. Fletcher Anon next month.

## 2022-01-30 NOTE — Assessment & Plan Note (Signed)
Continue Monday Wednesday Friday replacement.

## 2022-01-30 NOTE — Assessment & Plan Note (Signed)
Stable, off controller medication.  She sees pulmonology regularly.

## 2022-01-30 NOTE — Patient Instructions (Signed)
Medication Instructions:  - Your physician has recommended you make the following change in your medication:   1) START Aspirin 81 mg: - take 1 tablet by mouth once daily   2) START Chantix as previously directed  3) STOP Ibuprofen  4) You may use Tylenol Arthritis for pain  *If you need a refill on your cardiac medications before your next appointment, please call your pharmacy*   Lab Work: - none ordered  If you have labs (blood work) drawn today and your tests are completely normal, you will receive your results only by: [place] (if you have MyChart) OR A paper copy in the mail If you have any lab test that is abnormal or we need to change your treatment, we will call you to review the results.   Testing/Procedures: - none ordered   Follow-Up: At [facility], you and your health needs are our priority.  As part of our continuing mission to provide you with exceptional heart care, we have created designated Provider Care Teams.  These Care Teams include your primary Cardiologist (physician) and Advanced Practice Providers (APPs -  Physician Assistants and Nurse Practitioners) who all work together to provide you with the care you need, when you need it.  We recommend signing up for the patient portal called "MyChart".  Sign up information is provided on this After Visit Summary.  MyChart is used to connect with patients for Virtual Visits (Telemedicine).  Patients are able to view lab/test results, encounter notes, upcoming appointments, etc.  Non-urgent messages can be sent to your provider as well.   To learn more about what you can do with MyChart, go to [email or web address].    Your next appointment:   As scheduled    The format for your next appointment:   In Person  Provider:   Kathlyn Sacramento, MD    Other Instructions  1) You should start a walking program for your circulation  2) A pool can be beneficial as well for your legs  Important  Information About Sugar

## 2022-01-30 NOTE — Assessment & Plan Note (Signed)
Chronic, stable on metformin XR 1000 mg daily. She is overdue for diabetic eye exam-asked to call and schedule.

## 2022-02-03 NOTE — Assessment & Plan Note (Signed)
Preventative protocols reviewed and updated unless pt declined. Discussed healthy diet and lifestyle.  

## 2022-02-11 ENCOUNTER — Ambulatory Visit: Payer: 59 | Admitting: Cardiovascular Disease

## 2022-02-23 ENCOUNTER — Ambulatory Visit (INDEPENDENT_AMBULATORY_CARE_PROVIDER_SITE_OTHER): Payer: 59

## 2022-02-23 DIAGNOSIS — R0989 Other specified symptoms and signs involving the circulatory and respiratory systems: Secondary | ICD-10-CM

## 2022-02-27 ENCOUNTER — Encounter: Payer: Self-pay | Admitting: Family Medicine

## 2022-03-06 ENCOUNTER — Encounter: Payer: Self-pay | Admitting: Cardiovascular Disease

## 2022-03-06 ENCOUNTER — Ambulatory Visit: Payer: 59 | Admitting: Cardiovascular Disease

## 2022-03-06 VITALS — BP 118/60 | HR 79 | Ht 66.0 in | Wt 188.0 lb

## 2022-03-06 DIAGNOSIS — E785 Hyperlipidemia, unspecified: Secondary | ICD-10-CM

## 2022-03-06 DIAGNOSIS — Z720 Tobacco use: Secondary | ICD-10-CM | POA: Diagnosis not present

## 2022-03-06 DIAGNOSIS — I779 Disorder of arteries and arterioles, unspecified: Secondary | ICD-10-CM | POA: Diagnosis not present

## 2022-03-06 DIAGNOSIS — I2510 Atherosclerotic heart disease of native coronary artery without angina pectoris: Secondary | ICD-10-CM | POA: Diagnosis not present

## 2022-03-06 DIAGNOSIS — I739 Peripheral vascular disease, unspecified: Secondary | ICD-10-CM

## 2022-03-09 ENCOUNTER — Other Ambulatory Visit: Payer: Self-pay | Admitting: Physician Assistant

## 2022-03-09 DIAGNOSIS — I739 Peripheral vascular disease, unspecified: Secondary | ICD-10-CM

## 2022-03-10 LAB — HM DEXA SCAN

## 2022-03-20 ENCOUNTER — Other Ambulatory Visit: Payer: Self-pay | Admitting: Cardiovascular Disease

## 2022-03-20 DIAGNOSIS — I2510 Atherosclerotic heart disease of native coronary artery without angina pectoris: Secondary | ICD-10-CM

## 2022-03-20 DIAGNOSIS — I10 Essential (primary) hypertension: Secondary | ICD-10-CM

## 2022-03-27 ENCOUNTER — Telehealth: Payer: Self-pay | Admitting: Family Medicine

## 2022-03-27 DIAGNOSIS — M810 Age-related osteoporosis without current pathological fracture: Secondary | ICD-10-CM

## 2022-03-27 NOTE — Telephone Encounter (Signed)
Received DEXA from [facility].  Plz notify patient - bone density scan returned in osteoporosis range, actually worsened from last check. Do recommend starting bone strengthening medication - rec she schedule OV to discuss options (bisphosphonate vs prolia).   DEXA 02/2022 -  T -3.4 spine, -3.0 total L hip

## 2022-03-30 ENCOUNTER — Encounter: Payer: Self-pay | Admitting: Family Medicine

## 2022-03-30 NOTE — Telephone Encounter (Signed)
Spoke with Karen Osborne relaying results and Dr. Synthia Innocent message.  Karen Osborne verbalizes understanding and scheduled OV on 04/08/22 at 9:30.

## 2022-04-08 ENCOUNTER — Encounter: Payer: Self-pay | Admitting: Family Medicine

## 2022-04-08 ENCOUNTER — Ambulatory Visit (INDEPENDENT_AMBULATORY_CARE_PROVIDER_SITE_OTHER): Payer: 59 | Admitting: Family Medicine

## 2022-04-08 VITALS — BP 116/62 | HR 81 | Temp 97.5°F | Ht 66.0 in | Wt 187.4 lb

## 2022-04-08 DIAGNOSIS — F17200 Nicotine dependence, unspecified, uncomplicated: Secondary | ICD-10-CM

## 2022-04-08 DIAGNOSIS — M25552 Pain in left hip: Secondary | ICD-10-CM | POA: Diagnosis not present

## 2022-04-08 DIAGNOSIS — M810 Age-related osteoporosis without current pathological fracture: Secondary | ICD-10-CM | POA: Diagnosis not present

## 2022-04-08 MED ORDER — ALENDRONATE SODIUM 70 MG PO TABS: 70.0000 mg | ORAL_TABLET | ORAL | 11 refills | Status: DC

## 2022-04-08 NOTE — Patient Instructions (Addendum)
Try to get most or all of your calcium from your food--aim for 1200 mg/day for women over 30 and men over 70.  To figure out dietary calcium: 300 mg/day from all non dairy foods plus 300 mg per cup of milk, other dairy, or fortified juice.  Non dairy foods that contain calcium: Kale, oranges, sardines, oatmeal, soy milk/soybeans, salmon, white beans, dried figs, turnip greens, almonds, broccoli, tofu.   Continue vitamin D 5000 units Mon, Wed, Friday.   Start osteoporosis medicine fosamax once a week, take first thing in the morning when you wake up with glass of water, separate from other medicines and food by 1 hour if possible, stay upright after taking it. Watch for hip pain or jaw pain and stop and let us know if this occurs.   You have pain at your lateral hip - likely greater trochanteric pain syndrome. Try exercises provided today. If not improving, return to see sports medicine or we could do course of formal physical therapy.   Osteoporosis  Osteoporosis happens when the bones become thin and less dense than normal. Osteoporosis makes bones more brittle and fragile and more likely to break (fracture). Over time, osteoporosis can cause your bones to become so weak that they fracture after a minor fall. Bones in the hip, wrist, and spine are most likely to fracture due to osteoporosis. What are the causes? The exact cause of this condition is not known. What increases the risk? You are more likely to develop this condition if you: Have family members with this condition. Have poor nutrition. Use the following: Steroid medicines, such as prednisone. Anti-seizure medicines. Nicotine or tobacco, such as cigarettes, e-cigarettes, and chewing tobacco. Are female. Are age 62 or older. Are not physically active (are sedentary). Are of European or Asian descent. Have a small body frame. What are the signs or symptoms? A fracture might be the first sign of osteoporosis, especially if  the fracture results from a fall or injury that usually would not cause a bone to break. Other signs and symptoms include: Pain in the neck or low back. Stooped posture. Loss of height. How is this diagnosed? This condition may be diagnosed based on: Your medical history. A physical exam. A bone mineral density test, also called a DXA or DEXA test (dual-energy X-ray absorptiometry test). This test uses X-rays to measure the amount of minerals in your bones. How is this treated? This condition may be treated by: Making lifestyle changes, such as: Including foods with more calcium and vitamin D in your diet. Doing weight-bearing and muscle-strengthening exercises. Stopping tobacco use. Limiting alcohol intake. Taking medicine to slow the process of bone loss or to increase bone density. Taking daily supplements of calcium and vitamin D. Taking hormone replacement medicines, such as estrogen for women and testosterone for men. Monitoring your levels of calcium and vitamin D. The goal of treatment is to strengthen your bones and lower your risk for a fracture. Follow these instructions at home: Eating and drinking Include calcium and vitamin D in your diet. Calcium is important for bone health, and vitamin D helps your body absorb calcium. Good sources of calcium and vitamin D include: Certain fatty fish, such as salmon and tuna. Products that have calcium and vitamin D added to them (are fortified), such as fortified cereals. Egg yolks. Cheese. Liver.  Activity Do exercises as told by your health care provider. Ask your health care provider what exercises and activities are safe for you. You should  do: Exercises that make you work against gravity (weight-bearing exercises), such as tai chi, yoga, or walking. Exercises to strengthen muscles, such as lifting weights. Lifestyle Do not drink alcohol if: Your health care provider tells you not to drink. You are pregnant, may be pregnant,  or are planning to become pregnant. If you drink alcohol: Limit how much you use to: 0-1 drink a day for women. 0-2 drinks a day for men. Know how much alcohol is in your drink. In the [place] one drink equals one 12 oz bottle of beer (355 mL), one 5 oz glass of wine (148 mL), or one 1 oz glass of hard liquor (44 mL). Do not use any products that contain nicotine or tobacco, such as cigarettes, e-cigarettes, and chewing tobacco. If you need help quitting, ask your health care provider. Preventing falls Use devices to help you move around (mobility aids) as needed, such as canes, walkers, scooters, or crutches. Keep rooms well-lit and clutter-free. Remove tripping hazards from walkways, including cords and throw rugs. Install grab bars in bathrooms and safety rails on stairs. Use rubber mats in the bathroom and other areas that are often wet or slippery. Wear closed-toe shoes that fit well and support your feet. Wear shoes that have rubber soles or low heels. Review your medicines with your health care provider. Some medicines can cause dizziness or changes in blood pressure, which can increase your risk of falling. General instructions Take over-the-counter and prescription medicines only as told by your health care provider. Keep all follow-up visits. This is important. Contact a health care provider if: You have never been screened for osteoporosis and you are: A woman who is age 62 or older. A man who is age 26 or older. Get help right away if: You fall or injure yourself. Summary Osteoporosis is thinning and loss of density in your bones. This makes bones more brittle and fragile and more likely to break (fracture),even with minor falls. The goal of treatment is to strengthen your bones and lower your risk for a fracture. Include calcium and vitamin D in your diet. Calcium is important for bone health, and vitamin D helps your body absorb calcium. Talk with your health care provider  about screening for osteoporosis if you are a woman who is age 8 or older, or a man who is age 89 or older. This information is not intended to replace advice given to you by your health care provider. Make sure you discuss any questions you have with your health care provider. Document Revised: 02/15/2020 Document Reviewed: 02/15/2020 Elsevier Patient Education  [place].

## 2022-04-08 NOTE — Assessment & Plan Note (Signed)
Story/exam consistent with left greater trochanteric pain syndrome.  Handout provided from sports medicine patient advisor on trochanteric bursitis with exercises.  Update if not improving with this, consider sports medicine eval versus formal physical therapy.

## 2022-04-08 NOTE — Progress Notes (Signed)
Patient ID: Karen Osborne, female    DOB: [birth year], 66 y.o.   MRN: [id number]  This visit was conducted in person.  BP 116/62   Pulse 81   Temp (!) 97.5 F (36.4 C) (Temporal)   Ht _0  (1.676 m)   Wt 187 lb 6 oz (85 kg)   SpO2 96%   BMI 30.24 kg/m    CC: osteoporosis  Subjective:   HPI: Karen Osborne is a 66 y.o. female presenting on 04/08/2022 for Osteoporosis (Here to discuss tx options. )   Recent DEXA 02/2022 -  T -3.4 spine, -3.0 total L hip  Prior DEXA 07/2015 - T score -2.9 spine, -2.4 L hip  Here to discuss treatment options.  Avoiding calcium supplementation in history of CAD/PAD.  Discussed dietary calcium. Weight bearing activity limited by joint pains mainly L>R hips. Describes lateral L hip pain worse with prolonged walk.   Has not previously taken bone strengthening osteoporosis medication.   LMP - in her late 62s S/p abdominal hysterectomy with BSO late 90s.  Did take HRT after this for a few years.   Continued smoker. Planning to use Chantix to help her quit smoking.      Relevant past medical, surgical, family and social history reviewed and updated as indicated. Interim medical history since our last visit reviewed. Allergies and medications reviewed and updated. Outpatient Medications Prior to Visit  Medication Sig Dispense Refill   albuterol (VENTOLIN HFA) 108 (90 Base) MCG/ACT inhaler Inhale 2 puffs into the lungs every 6 (six) hours as needed for wheezing or shortness of breath. 8 g 6   aspirin EC 81 MG tablet Take 1 tablet (81 mg total) by mouth daily. Swallow whole.     Blood Glucose Monitoring Suppl (ONE TOUCH ULTRA 2) w/Device KIT 1 each by Does not apply route daily. Use as directed to check sugar once a day 1 each 0   carvedilol (COREG) 6.25 MG tablet TAKE 1 TABLET BY MOUTH TWICE DAILY WITH A MEAL 180 tablet 0   Cholecalciferol (VITAMIN D) 125 MCG (5000 UT) CAPS Take 1 capsule by mouth every Monday, Wednesday, and Friday.      clopidogrel (PLAVIX) 75 MG tablet TAKE 1 TABLET BY MOUTH EVERY DAY 90 tablet 3   Coenzyme Q10 100 MG capsule Take 1 capsule (100 mg total) by mouth daily.     Cyanocobalamin (B-12) 1000 MCG SUBL Place 1 tablet under the tongue every Monday, Wednesday, and Friday.     Fluticasone-Umeclidin-Vilant (TRELEGY ELLIPTA) 100-62.5-25 MCG/INH AEPB Inhale 1 puff into the lungs daily. 60 each 5   glucose blood (ONE TOUCH ULTRA TEST) test strip 1 each by Other route daily. Use as instructed to check sugar once a day. 100 each 0   hydrochlorothiazide (MICROZIDE) 12.5 MG capsule TAKE 1 CAPSULE BY MOUTH EVERY DAY 90 capsule 2   losartan (COZAAR) 50 MG tablet Take 1 tablet (50 mg total) by mouth daily. 90 tablet 3   metFORMIN (GLUCOPHAGE-XR) 500 MG 24 hr tablet Take 2 tablets (1,000 mg total) by mouth daily. 180 tablet 3   nicotine (NICODERM CQ - DOSED IN MG/24 HOURS) 14 mg/24hr patch Place 1 patch (14 mg total) onto the skin daily. 28 patch 0   nitroGLYCERIN (NITROSTAT) 0.4 MG SL tablet Place 1 tablet (0.4 mg total) under the tongue every 5 (five) minutes as needed for chest pain (up to 3 doses). 25 tablet 3   ONETOUCH DELICA LANCETS 70Y MISC 1  each by Does not apply route daily. Use as directed to check sugar once a day. 100 each 0   rOPINIRole (REQUIP) 1 MG tablet TAKE 1-2 TABLETS (1-2 MG TOTAL) BY MOUTH AT BEDTIME. 180 tablet 2   rosuvastatin (CRESTOR) 40 MG tablet TAKE 1 TABLET BY MOUTH EVERY DAY 90 tablet 0   varenicline (CHANTIX) 1 MG tablet Take 1 tablet (1 mg total) by mouth 2 (two) times daily. 60 tablet 3   No facility-administered medications prior to visit.     Per HPI unless specifically indicated in ROS section below Review of Systems  Objective:  BP 116/62   Pulse 81   Temp (!) 97.5 F (36.4 C) (Temporal)   Ht _0  (1.676 m)   Wt 187 lb 6 oz (85 kg)   SpO2 96%   BMI 30.24 kg/m   Wt Readings from Last 3 Encounters:  04/08/22 187 lb 6 oz (85 kg)  03/06/22 188 lb (85.3 kg)  01/30/22  187 lb (84.8 kg)      Physical Exam Vitals and nursing note reviewed.  Constitutional:      Appearance: Normal appearance. She is not ill-appearing.  HENT:     Mouth/Throat:     Comments: edentuous Cardiovascular:     Rate and Rhythm: Normal rate and regular rhythm.     Pulses: Normal pulses.     Heart sounds: Normal heart sounds. No murmur heard. Pulmonary:     Effort: Pulmonary effort is normal. No respiratory distress.     Breath sounds: No wheezing, rhonchi or rales.     Comments: Coarse Musculoskeletal:        General: Normal range of motion.     Right lower leg: No edema.     Left lower leg: No edema.     Comments:  Neg SLR bilaterally. No pain with int/ext rotation at hip. No pain at SIJ, or sciatic notch bilaterally.  Tender to palpation at L greater trochanteric area  Skin:    General: Skin is warm and dry.     Findings: No rash.  Neurological:     Mental Status: She is alert.  Psychiatric:        Mood and Affect: Mood normal.        Behavior: Behavior normal.       Results for orders placed or performed in visit on 03/30/22  HM DEXA SCAN  Result Value Ref Range   HM Dexa Scan Osteoporsis    Lab Results  Component Value Date   VD25OH 52.33 01/23/2022   Lab Results  Component Value Date   TSH 1.34 01/24/2021    Assessment & Plan:   Problem List Items Addressed This Visit     TOBACCO ABUSE    Reviewed relation of tobacco use to worsening bone strength, encouraged full cessation.       Osteoporosis - Primary    Reviewed latest DEXA scan showing progression of osteoporosis in spine and new osteoporosis range T score in left hip. Reviewed dietary calcium intake, vitamin D supplementation, regular weightbearing exercise.  She avoids calcium supplements due to known PAD/CAD history. Reviewed osteoporosis medications, specifically denosumab and alendronate as well as mechanisms of action and possible side effects/adverse events associated with  medications.  She is interested in trying bisphosphonate.  Reviewed correct administration of Fosamax first thing in the morning on empty stomach with a glass of water, separate from medications and food, and need to stay upright after taking medication. Rx Fosamax sent  to the pharmacy, patient will let us know how she does with this.      Relevant Medications   alendronate (FOSAMAX) 70 MG tablet   Lateral pain of left hip    Story/exam consistent with left greater trochanteric pain syndrome.  Handout provided from sports medicine patient advisor on trochanteric bursitis with exercises.  Update if not improving with this, consider sports medicine eval versus formal physical therapy.        Meds ordered this encounter  Medications   alendronate (FOSAMAX) 70 MG tablet    Sig: Take 1 tablet (70 mg total) by mouth every 7 (seven) days. Take with a full glass of water on an empty stomach.    Dispense:  4 tablet    Refill:  11   No orders of the defined types were placed in this encounter.    Patient instructions: Try to get most or all of your calcium from your food--aim for 1200 mg/day for women over 37 and men over 70.  To figure out dietary calcium: 300 mg/day from all non dairy foods plus 300 mg per cup of milk, other dairy, or fortified juice.  Non dairy foods that contain calcium: Kale, oranges, sardines, oatmeal, soy milk/soybeans, salmon, white beans, dried figs, turnip greens, almonds, broccoli, tofu.   Continue vitamin D 5000 units Mon, Wed, Friday.   Start osteoporosis medicine fosamax once a week, take first thing in the morning when you wake up with glass of water, separate from other medicines and food by 1 hour if possible, stay upright after taking it. Watch for hip pain or jaw pain and stop and let us know if this occurs.   You have pain at your lateral hip - likely greater trochanteric pain syndrome. Try exercises provided today. If not improving, return to see sports  medicine or we could do course of formal physical therapy.   Follow up plan: Return as needed.  [name], MD

## 2022-04-08 NOTE — Assessment & Plan Note (Addendum)
Reviewed relation of tobacco use to worsening bone strength, encouraged full cessation.

## 2022-04-08 NOTE — Assessment & Plan Note (Signed)
Reviewed latest DEXA scan showing progression of osteoporosis in spine and new osteoporosis range T score in left hip. Reviewed dietary calcium intake, vitamin D supplementation, regular weightbearing exercise.  She avoids calcium supplements due to known PAD/CAD history. Reviewed osteoporosis medications, specifically denosumab and alendronate as well as mechanisms of action and possible side effects/adverse events associated with medications.  She is interested in trying bisphosphonate.  Reviewed correct administration of Fosamax first thing in the morning on empty stomach with a glass of water, separate from medications and food, and need to stay upright after taking medication. Rx Fosamax sent to the pharmacy, patient will let us know how she does with this.

## 2022-04-28 ENCOUNTER — Other Ambulatory Visit: Payer: Self-pay | Admitting: Physician Assistant

## 2022-04-28 DIAGNOSIS — E7800 Pure hypercholesterolemia, unspecified: Secondary | ICD-10-CM

## 2022-05-08 ENCOUNTER — Other Ambulatory Visit: Payer: Self-pay | Admitting: Physician Assistant

## 2022-05-08 NOTE — Telephone Encounter (Signed)
Please review for refill on Chantix. Thank you

## 2022-05-14 ENCOUNTER — Other Ambulatory Visit: Payer: Self-pay | Admitting: Pulmonary Disease

## 2022-06-11 ENCOUNTER — Other Ambulatory Visit: Payer: Self-pay | Admitting: Cardiovascular Disease

## 2022-06-11 ENCOUNTER — Other Ambulatory Visit: Payer: Self-pay | Admitting: Family Medicine

## 2022-06-11 DIAGNOSIS — E7800 Pure hypercholesterolemia, unspecified: Secondary | ICD-10-CM

## 2022-06-12 NOTE — Telephone Encounter (Signed)
Requip Last filled:  05/03/22, #180 Last OV:  04/08/22, osteoporosis tx options Next OV:  08/03/22, 6 mo DM f/u

## 2022-06-30 ENCOUNTER — Ambulatory Visit: Payer: 59 | Admitting: Cardiovascular Disease

## 2022-07-28 ENCOUNTER — Ambulatory Visit: Payer: 59

## 2022-07-29 ENCOUNTER — Ambulatory Visit
Admission: RE | Admit: 2022-07-29 | Discharge: 2022-07-29 | Disposition: A | Discharge status: Home / Self Care | Payer: 59 | Source: Ambulatory Visit | Attending: Acute Care | Admitting: Acute Care

## 2022-07-29 DIAGNOSIS — Z87891 Personal history of nicotine dependence: Secondary | ICD-10-CM

## 2022-07-29 DIAGNOSIS — F17210 Nicotine dependence, cigarettes, uncomplicated: Secondary | ICD-10-CM

## 2022-07-31 ENCOUNTER — Other Ambulatory Visit: Payer: Self-pay

## 2022-07-31 DIAGNOSIS — F17210 Nicotine dependence, cigarettes, uncomplicated: Secondary | ICD-10-CM

## 2022-07-31 DIAGNOSIS — Z87891 Personal history of nicotine dependence: Secondary | ICD-10-CM

## 2022-07-31 DIAGNOSIS — Z122 Encounter for screening for malignant neoplasm of respiratory organs: Secondary | ICD-10-CM

## 2022-08-03 ENCOUNTER — Ambulatory Visit (INDEPENDENT_AMBULATORY_CARE_PROVIDER_SITE_OTHER): Payer: 59 | Admitting: Family Medicine

## 2022-08-03 ENCOUNTER — Encounter: Payer: Self-pay | Admitting: Family Medicine

## 2022-08-03 VITALS — BP 138/78 | HR 78 | Temp 97.2°F | Ht 66.0 in | Wt 193.5 lb

## 2022-08-03 DIAGNOSIS — J449 Chronic obstructive pulmonary disease, unspecified: Secondary | ICD-10-CM

## 2022-08-03 DIAGNOSIS — M810 Age-related osteoporosis without current pathological fracture: Secondary | ICD-10-CM

## 2022-08-03 DIAGNOSIS — I708 Atherosclerosis of other arteries: Secondary | ICD-10-CM

## 2022-08-03 DIAGNOSIS — E1169 Type 2 diabetes mellitus with other specified complication: Secondary | ICD-10-CM

## 2022-08-03 DIAGNOSIS — I700 Atherosclerosis of aorta: Secondary | ICD-10-CM | POA: Diagnosis not present

## 2022-08-03 DIAGNOSIS — Z23 Encounter for immunization: Secondary | ICD-10-CM

## 2022-08-03 DIAGNOSIS — F17200 Nicotine dependence, unspecified, uncomplicated: Secondary | ICD-10-CM

## 2022-08-03 DIAGNOSIS — I739 Peripheral vascular disease, unspecified: Secondary | ICD-10-CM

## 2022-08-03 DIAGNOSIS — E785 Hyperlipidemia, unspecified: Secondary | ICD-10-CM

## 2022-08-03 DIAGNOSIS — M791 Myalgia, unspecified site: Secondary | ICD-10-CM

## 2022-08-03 LAB — POCT GLYCOSYLATED HEMOGLOBIN (HGB A1C): Hemoglobin A1C: 7.2 % — AB (ref 4.0–5.6)

## 2022-08-03 MED ORDER — TRULICITY 0.75 MG/0.5ML ~~LOC~~ SOAJ: 0.7500 mg | SUBCUTANEOUS | 6 refills | Status: DC

## 2022-08-03 NOTE — Assessment & Plan Note (Signed)
Continue to encourage smoking cessation. Reviewed chantix dosing and setting quit date 2 wks into course. She plans to start soon, states she will have quit smoking by CPE date.

## 2022-08-03 NOTE — Patient Instructions (Addendum)
Schedule eye exam as you're due.  Flu shot today  Good to see you today Return in 6 months for physical.  Try out chantix, let us know if any trouble.

## 2022-08-03 NOTE — Assessment & Plan Note (Addendum)
Chronic, deteriorated.  Continue metformin XR '500mg'$  BID.  Encouraged she schedule eye exam. Foot exam today.  She is interested in Sims - reviewed mechanism of action as well as side effects to watch for including nausea, constipation, pancreatitis. No fmhx thyroid cancer. Trulicity 2.24 weekly sent to pharmacy. Update if difficulty tolerating or concerns. Reassess control at 6 mo CPE.

## 2022-08-03 NOTE — Progress Notes (Signed)
Patient ID: Karen Osborne, female    DOB: 04/25/1956, 66 y.o.   MRN: [id number]  This visit was conducted in person.  BP 138/78 (BP Location: Left Arm, Patient Position: Sitting)   Pulse 78   Temp (!) 97.2 F (36.2 C) (Skin)   Ht _0  (1.676 m)   Wt 193 lb 8 oz (87.8 kg)   SpO2 98%   BMI 31.23 kg/m    CC: 6 mo f/u visit  Subjective:   HPI: Karen Osborne is a 66 y.o. female presenting on 08/03/2022 for Follow-up (6 mouth)   Regularly sees cardiology for known CAD and PAD. 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   OP - fosamax started 03/2022 - tolerating well and taking appropriately  Recent DEXA 02/2022 -  T -3.4 spine, -3.0 total L hip  Prior DEXA 07/2015 - T score -2.9 spine, -2.4 L hip  Smoking - hasn't started chantix yet.   DM - does regularly check sugars 4 times a week - 120 fasting. Compliant with antihyperglycemic regimen which includes: metformin XR 514m bid. Denies low sugars or hypoglycemic symptoms. Denies paresthesias, blurry vision. Last diabetic eye exam 2021 - DUE. Glucometer brand: one touch ultra. Last foot exam: DUE. DSME: started 2019 but unaffordable. Lab Results  Component Value Date   HGBA1C 7.2 (A) 08/03/2022   Diabetic Foot Exam - Simple   Simple Foot Form Diabetic Foot exam was performed with the following findings: Yes 08/03/2022  8:11 AM  Visual Inspection See comments: Yes Sensation Testing Intact to touch and monofilament testing bilaterally: Yes Pulse Check See comments: Yes Comments Diminished pulses bilaterally Corn vs plantar wart left sole Callus to R>L medial great toes    Lab Results  Component Value Date   MICROALBUR 1.7 01/23/2022         Relevant past medical, surgical, family and social history reviewed and updated as indicated. Interim medical history since our last visit  reviewed. Allergies and medications reviewed and updated. Outpatient Medications Prior to Visit  Medication Sig Dispense Refill   albuterol (VENTOLIN HFA) 108 (90 Base) MCG/ACT inhaler Inhale 2 puffs into the lungs every 6 (six) hours as needed for wheezing or shortness of breath. 8 g 6   alendronate (FOSAMAX) 70 MG tablet Take 1 tablet (70 mg total) by mouth every 7 (seven) days. Take with a full glass of water on an empty stomach. 4 tablet 11   aspirin EC 81 MG tablet Take 1 tablet (81 mg total) by mouth daily. Swallow whole.     Blood Glucose Monitoring Suppl (ONE TOUCH ULTRA 2) w/Device KIT 1 each by Does not apply route daily. Use as directed to check sugar once a day 1 each 0   carvedilol (COREG) 6.25 MG tablet TAKE 1 TABLET BY MOUTH TWICE DAILY WITH A MEAL 180 tablet 0   Cholecalciferol (VITAMIN D) 125 MCG (5000 UT) CAPS Take 1 capsule by mouth every Monday, Wednesday, and Friday.     clopidogrel (PLAVIX) 75 MG tablet TAKE 1 TABLET BY MOUTH EVERY DAY 90 tablet 3   Coenzyme Q10 100 MG capsule Take 1 capsule (100 mg total) by mouth daily.     Cyanocobalamin (B-12) 1000 MCG SUBL Place 1 tablet under the tongue every Monday, Wednesday, and Friday.     Fluticasone-Umeclidin-Vilant (TRELEGY ELLIPTA) 100-62.5-25 MCG/ACT AEPB TAKE 1 PUFF BY MOUTH  EVERY DAY 60 each 2   glucose blood (ONE TOUCH ULTRA TEST) test strip 1 each by Other route daily. Use as instructed to check sugar once a day. 100 each 0   hydrochlorothiazide (MICROZIDE) 12.5 MG capsule TAKE 1 CAPSULE BY MOUTH EVERY DAY 90 capsule 2   losartan (COZAAR) 50 MG tablet Take 1 tablet (50 mg total) by mouth daily. 90 tablet 3   metFORMIN (GLUCOPHAGE-XR) 500 MG 24 hr tablet Take 2 tablets (1,000 mg total) by mouth daily. 180 tablet 3   nitroGLYCERIN (NITROSTAT) 0.4 MG SL tablet Place 1 tablet (0.4 mg total) under the tongue every 5 (five) minutes as needed for chest pain (up to 3 doses). 25 tablet 3   ONETOUCH DELICA LANCETS 96E MISC 1 each by  Does not apply route daily. Use as directed to check sugar once a day. 100 each 0   rOPINIRole (REQUIP) 1 MG tablet TAKE 1 TO 2 TABLETS BY MOUTH AT BEDTIME 180 tablet 2   rosuvastatin (CRESTOR) 40 MG tablet TAKE 1 TABLET BY MOUTH EVERY DAY 90 tablet 0   nicotine (NICODERM CQ - DOSED IN MG/24 HOURS) 14 mg/24hr patch Place 1 patch (14 mg total) onto the skin daily. (Patient not taking: Reported on 08/03/2022) 28 patch 0   varenicline (CHANTIX) 1 MG tablet Take 1 tablet (1 mg total) by mouth 2 (two) times daily. (Patient not taking: Reported on 08/03/2022) 60 tablet 3   No facility-administered medications prior to visit.     Per HPI unless specifically indicated in ROS section below Review of Systems  Objective:  BP 138/78 (BP Location: Left Arm, Patient Position: Sitting)   Pulse 78   Temp (!) 97.2 F (36.2 C) (Skin)   Ht _0  (1.676 m)   Wt 193 lb 8 oz (87.8 kg)   SpO2 98%   BMI 31.23 kg/m   Wt Readings from Last 3 Encounters:  08/03/22 193 lb 8 oz (87.8 kg)  04/08/22 187 lb 6 oz (85 kg)  03/06/22 188 lb (85.3 kg)      Physical Exam Vitals and nursing note reviewed.  Constitutional:      Appearance: Normal appearance. She is not ill-appearing.  HENT:     Mouth/Throat:     Mouth: Mucous membranes are moist.     Pharynx: Oropharynx is clear. No oropharyngeal exudate or posterior oropharyngeal erythema.  Eyes:     Extraocular Movements: Extraocular movements intact.     Conjunctiva/sclera: Conjunctivae normal.     Pupils: Pupils are equal, round, and reactive to light.  Cardiovascular:     Rate and Rhythm: Normal rate and regular rhythm.     Pulses: Normal pulses.     Heart sounds: Normal heart sounds. No murmur heard. Pulmonary:     Effort: Pulmonary effort is normal. No respiratory distress.     Breath sounds: Normal breath sounds. No wheezing, rhonchi or rales.  Musculoskeletal:     Right lower leg: No edema.     Left lower leg: No edema.     Comments: See HPI for  foot exam if done  Skin:    General: Skin is warm and dry.     Findings: No rash.  Neurological:     Mental Status: She is alert.  Psychiatric:        Mood and Affect: Mood normal.        Behavior: Behavior normal.       Results for orders placed or performed in visit on  08/03/22  POCT glycosylated hemoglobin (Hb A1C)  Result Value Ref Range   Hemoglobin A1C     HbA1c POC (<> result, manual entry)     HbA1c, POC (prediabetic range)     HbA1c, POC (controlled diabetic range)     HbA1c, POC (controlled diabetic range)     HbA1c, POC (controlled diabetic range)     Hemoglobin A1C 7.2 (A) 4.0 - 5.6 %    Assessment & Plan:   Problem List Items Addressed This Visit     Hyperlipidemia associated with type 2 diabetes mellitus ([place])    Chronic, continue high dose crestor.  Coq10 has significantly helped myalgias.       Relevant Medications   Dulaglutide (TRULICITY) 8.76 OT/1.5BW SOPN   TOBACCO ABUSE    Continue to encourage smoking cessation. Reviewed chantix dosing and setting quit date 2 wks into course. She plans to start soon, states she will have quit smoking by CPE date.       Osteoporosis    She has started fosamax, is tolerating well. Continue this.       Type 2 diabetes mellitus with other specified complication (HCC) - Primary    Chronic, deteriorated.  Continue metformin XR 571m BID.  Encouraged she schedule eye exam. Foot exam today.  She is interested in [place]- reviewed mechanism of action as well as side effects to watch for including nausea, constipation, pancreatitis. No fmhx thyroid cancer. Trulicity 06.20weekly sent to pharmacy. Update if difficulty tolerating or concerns. Reassess control at 6 mo CPE.       Relevant Medications   Dulaglutide (TRULICITY) 03.55MHR/4.1ULSOPN   Other Relevant Orders   POCT glycosylated hemoglobin (Hb A1C) (Completed)   COPD [place]ch[place] obstructive pulmonary disease[place]    Noted again on recent CT scan. She is on  trelegy ellipta daily.       Aorto-iliac atherosclerosis (HCC)    Continue statin.       Myalgia    CoQ 10 has significantly helped myalgias.       PAD (peripheral artery disease) ([place]    She is undergoing graded walking exercise program through cardiology.       Other Visit Diagnoses     Need for vaccination for H flu type B       Relevant Orders   Flu Vaccine QUAD High Dose(Fluad) (Completed)        Meds ordered this encounter  Medications   Dulaglutide (TRULICITY) 08.45MXM/4.6OESOPN    Sig: Inject 0.75 mg into the skin once a week.    Dispense:  2 mL    Refill:  6   Orders Placed This Encounter  Procedures   Flu Vaccine QUAD High Dose(Fluad)   POCT glycosylated hemoglobin (Hb A1C)    Associate with Z13.1     Patient Instructions  Schedule eye exam as you're due.  Flu shot today  Good to see you today Return in 6 months for physical.  Try out chantix, let u[place]ore[place]now if any trouble.   Follow up plan: Return in about 6 months (around 02/01/2023) for annual exam, prior fasting for blood work, medicare wellness visit.  JRia Bush MD

## 2022-08-03 NOTE — Assessment & Plan Note (Signed)
She is undergoing graded walking exercise program through cardiology.

## 2022-08-03 NOTE — Assessment & Plan Note (Signed)
Noted again on recent CT scan. She is on trelegy ellipta daily.

## 2022-08-03 NOTE — Assessment & Plan Note (Signed)
CoQ 10 has significantly helped myalgias.

## 2022-08-03 NOTE — Assessment & Plan Note (Signed)
Continue statin. 

## 2022-08-03 NOTE — Assessment & Plan Note (Signed)
Chronic, continue high dose crestor.  Coq10 has significantly helped myalgias.

## 2022-08-03 NOTE — Assessment & Plan Note (Signed)
She has started fosamax, is tolerating well. Continue this.

## 2022-09-09 ENCOUNTER — Other Ambulatory Visit: Payer: Self-pay | Admitting: Cardiovascular Disease

## 2022-09-10 NOTE — Telephone Encounter (Signed)
Please review for refill. Primary cardiologist-Dr. Burt Knack.  Seen by Dr. Fletcher Anon in [place] office for PAD. Thank you!

## 2022-09-10 NOTE — Telephone Encounter (Signed)
This is a [facility] pt 

## 2022-10-08 ENCOUNTER — Ambulatory Visit: Payer: 59 | Admitting: Cardiovascular Disease

## 2022-10-12 ENCOUNTER — Ambulatory Visit: Payer: 59 | Attending: Cardiovascular Disease | Admitting: Medical

## 2022-10-12 ENCOUNTER — Encounter: Payer: Self-pay | Admitting: Medical

## 2022-10-12 VITALS — BP 120/70 | HR 75 | Ht 66.0 in | Wt 191.5 lb

## 2022-10-12 DIAGNOSIS — I10 Essential (primary) hypertension: Secondary | ICD-10-CM

## 2022-10-12 DIAGNOSIS — E782 Mixed hyperlipidemia: Secondary | ICD-10-CM

## 2022-10-12 DIAGNOSIS — Z720 Tobacco use: Secondary | ICD-10-CM | POA: Diagnosis not present

## 2022-10-12 DIAGNOSIS — I779 Disorder of arteries and arterioles, unspecified: Secondary | ICD-10-CM | POA: Diagnosis not present

## 2022-10-12 DIAGNOSIS — I739 Peripheral vascular disease, unspecified: Secondary | ICD-10-CM

## 2022-10-12 NOTE — Patient Instructions (Signed)
Medication Instructions:  No changes *If you need a refill on your cardiac medications before your next appointment, please call your pharmacy*   Lab Work: None ordered If you have labs (blood work) drawn today and your tests are completely normal, you will receive your results only by: MyChart Message (if you have MyChart) OR A paper copy in the mail If you have any lab test that is abnormal or we need to change your treatment, we will call you to review the results.   Testing/Procedures: None ordered   Follow-Up: At [facility], you and your health needs are our priority.  As part of our continuing mission to provide you with exceptional heart care, we have created designated Provider Care Teams.  These Care Teams include your primary Cardiologist (physician) and Advanced Practice Providers (APPs -  Physician Assistants and Nurse Practitioners) who all work together to provide you with the care you need, when you need it.  We recommend signing up for the patient portal called "MyChart".  Sign up information is provided on this After Visit Summary.  MyChart is used to connect with patients for Virtual Visits (Telemedicine).  Patients are able to view lab/test results, encounter notes, upcoming appointments, etc.  Non-urgent messages can be sent to your provider as well.   To learn more about what you can do with MyChart, go to [email or web address].    Your next appointment:   6 month(s)  Provider:   You may see Dr. Arida or one of the following Advanced Practice Providers on your designated Care Team:   Christopher Berge, NP Ryan Dunn, PA-C Cadence Furth, PA-C Sheri Hammock, NP    

## 2022-10-12 NOTE — Progress Notes (Signed)
Cardiology Office Note:    Date:  10/12/2022   ID:  Karen Osborne, DOB 1956-06-18, MRN [id number]  PCP:  [name], MD  [facility] Cardiologist:  Sherren Mocha, MD  [facility] Electrophysiologist:  None   Referring MD: [name], MD   Chief Complaint: 3 month follow-up  History of Present Illness:    Karen Osborne is a 67 y.o. female with a hx of PAD, CAD s/p PCI and DES to the mLAD in May 2013, DM2, HTN, HLD, COPD and toabcco use.   The patient had a NSTEMI s/p PCI and DES placement to the mLAD in May 2013.   The patient was referred to Dr. Fletcher Anon for PAD with leg pain. Noninvasive vascular studies showed mildly reduced ABI bilaterally. Duplex showed no significant infrainguinal disease, but there was evidence of b/l common iliac artery stenosis. She was last seen 02/2022 on maximum therapy for PAD. She was given exercise instructions with a plan to reevaluate in 3 months.   Today, the patient reports she is not able to walk very long distances due to hip and back pain. She can walk from the mail box and back. She denies leg pain at rest. The patient denies chest pain. She has chronic SOB, this is unchanged. She is still smoking.   Past Medical History:  Diagnosis Date   BRCA negative 08/2015   MyRisk neg   Chronic diastolic CHF (congestive heart failure) (HCC)    COPD (chronic obstructive pulmonary disease) with emphysema ([place]) 2019   Coronary artery disease    a. s/p NSTEMI - LHC 01/27/12: prox LAD 20%, mid LAD 99%, prox OM 30%, prox RCA 30%, mid RCA 50%, dist RCA 30%, EF 60%.  PCI:  2.25 x 16 mm Promus Element DES to mid LAD //  b. Myoview (7/15):  Fixed apical defect (breast attenuation versus scar), no ischemia, EF 64%; low risk  // Myoview 7/22: EF 69, no ischemia or infarction; low risk   COVID-19 virus infection 07/2021   Rx molnupiravir -> rash so stopped early   Depression    Diabetes mellitus without complication ([place]) 3474   Family history of  ovarian cancer    Glucose intolerance (impaired glucose tolerance)    A1C 6.1 01/2012   History of echocardiogram    Echo (01/28/12):  Mild LVH, EF 55%, apical septum and true apex severe HK, Gr 2 DD, mild LAE, normal RVF // Echo 7/18: mild LVH, EF 60-65, no RWMA, Gr 2 DD, normal RVF   History of nuclear stress test    Nuclear stress test 4/19: EF 74, apicoseptal defect c/w atten artifact, normal perfusion; Low Risk   Hypercholesteremia    Hypertension    Myocardial infarction ([place])    NSVD (normal spontaneous vaginal delivery) 1988   Osteopenia 2016   hip   Osteoporosis 2019   in spine and femoral neck; at [facility]   Restless leg syndrome    Tobacco abuse     Past Surgical History:  Procedure Laterality Date   ABDOMINAL HYSTERECTOMY     TAHBSO   COLONOSCOPY  10/2006   TA, [place] (Dr Vira Agar)   COLONOSCOPY WITH PROPOFOL N/A 08/19/2018   TAs, rpt 3 yrs (Tahiliani, Cromwell B, MD)   CORONARY ANGIOPLASTY WITH STENT PLACEMENT  01/27/2012   DES to LAD   gyn surgery  02/1998   total hysterectomy, abn pap smear, no cancer   LEFT HEART CATHETERIZATION WITH CORONARY ANGIOGRAM N/A 01/27/2012   Procedure: LEFT  HEART CATHETERIZATION WITH CORONARY ANGIOGRAM;  Surgeon: Burnell Blanks, MD;  Location: [facility] CATH LAB;  Service: Cardiovascular;  Laterality: N/A;   PERCUTANEOUS CORONARY STENT INTERVENTION (PCI-S)  01/27/2012   Procedure: PERCUTANEOUS CORONARY STENT INTERVENTION (PCI-S);  Surgeon: Burnell Blanks, MD;  Location: [facility] CATH LAB;  Service: Cardiovascular;;    Current Medications: Current Meds  Medication Sig   albuterol (VENTOLIN HFA) 108 (90 Base) MCG/ACT inhaler Inhale 2 puffs into the lungs every 6 (six) hours as needed for wheezing or shortness of breath.   alendronate (FOSAMAX) 70 MG tablet Take 1 tablet (70 mg total) by mouth every 7 (seven) days. Take with a full glass of water on an empty stomach.   aspirin EC 81 MG tablet Take 1 tablet (81 mg total) by mouth daily. Swallow  whole.   Blood Glucose Monitoring Suppl (ONE TOUCH ULTRA 2) w/Device KIT 1 each by Does not apply route daily. Use as directed to check sugar once a day   carvedilol (COREG) 6.25 MG tablet TAKE 1 TABLET BY MOUTH TWICE DAILY WITH A MEAL   Cholecalciferol (VITAMIN D) 125 MCG (5000 UT) CAPS Take 1 capsule by mouth every Monday, Wednesday, and Friday.   clopidogrel (PLAVIX) 75 MG tablet TAKE 1 TABLET BY MOUTH EVERY DAY   Coenzyme Q10 100 MG capsule Take 1 capsule (100 mg total) by mouth daily.   Cyanocobalamin (B-12) 1000 MCG SUBL Place 1 tablet under the tongue every Monday, Wednesday, and Friday.   Fluticasone-Umeclidin-Vilant (TRELEGY ELLIPTA) 100-62.5-25 MCG/ACT AEPB TAKE 1 PUFF BY MOUTH EVERY DAY   glucose blood (ONE TOUCH ULTRA TEST) test strip 1 each by Other route daily. Use as instructed to check sugar once a day.   hydrochlorothiazide (MICROZIDE) 12.5 MG capsule Take 1 capsule (12.5 mg total) by mouth daily.   losartan (COZAAR) 50 MG tablet Take 1 tablet (50 mg total) by mouth daily.   metFORMIN (GLUCOPHAGE-XR) 500 MG 24 hr tablet Take 2 tablets (1,000 mg total) by mouth daily.   nitroGLYCERIN (NITROSTAT) 0.4 MG SL tablet Place 1 tablet (0.4 mg total) under the tongue every 5 (five) minutes as needed for chest pain (up to 3 doses).   ONETOUCH DELICA LANCETS 62G MISC 1 each by Does not apply route daily. Use as directed to check sugar once a day.   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[employer],on the floor  Tobacco Use   Smoking status: Every Day    Packs/day: 2.00    Years: 48.40     Total pack years: 96.80    Types: Cigarettes   Smokeless tobacco: Never   Tobacco comments:    0.5PPD 05/14/2021  Vaping Use   Vaping Use: Never used  Substance and Sexual Activity   Alcohol use: No   Drug use: No   Sexual activity: Yes    Birth control/protection: Surgical    Comment: Hysterectomy  Other Topics Concern   Not on file  Social History Narrative   Lives with husband and  son   Occ: sales (pipe fitting and valves)   Activity: some walking   Diet: good water, fruits/vegetables daily   Social Determinants of Health   Financial Resource Strain: Not on file  Food Insecurity: Not on file  Transportation Needs: Not on file  Physical Activity: Not on file  Stress: Not on file  Social Connections: Not on file     Family History: The patient's family history includes Diabetes in her sister; Heart attack in her father; Heart disease (age of onset: 55) in her father; Hypertension in her father; Kidney cancer in her father; Ovarian cancer (age of onset: 42) in her maternal aunt; Prostate cancer in her father. There is no history of Breast cancer.  ROS:   Please see the history of present illness.     All other systems reviewed and are negative.  EKGs/Labs/Other Studies Reviewed:    The following studies were reviewed today:  Echo 01/2022  1. Left ventricular ejection fraction, by estimation, is 55 to 60%. The  left ventricle has normal function. The left ventricle has no regional  wall motion abnormalities. There is mild left ventricular hypertrophy.  Left ventricular diastolic parameters  are consistent with Grade I diastolic dysfunction (impaired relaxation).   2. Right ventricular systolic function is normal. The right ventricular  size is normal.   3. Left atrial size was mildly dilated.   4. The mitral valve is normal in structure. Mild mitral valve  regurgitation. No evidence of mitral stenosis.   5. The aortic valve has an indeterminant number of cusps.  Aortic valve  regurgitation is not visualized. Aortic valve sclerosis is present, with  no evidence of aortic valve stenosis.   6. The inferior vena cava is normal in size with greater than 50%  respiratory variability, suggesting right atrial pressure of 3 mmHg.   Lower extremity US 01/2022   LOWER EXTREMITY DOPPLER STUDY  Patient Name:  LAZETTE ESTALA  Date of Exam:   01/20/2022 Medical Rec #: [id number]       Accession #:    [id number] Date of Birth: [birth year]        Patient Gender: F Patient Age:   11 years Exam Location:  [place] Procedure:      VAS [place] LOWER EXT ART SEG MULTI (SEGMENTALS & LE RAYNAUDS) Referring Phys: Christell Faith   --------------------------------------------------------------------------- -----   Indications: Claudication, and Bilateral hip pain after walking 50 yards. R=L.              resolves with rest. Present for 2 years and is progressive.    Comparison Study: None                   Aorto-iliac duplex performed 11/13/21. Positive for moderate                   iliac stenoses, bilaterally  Performing Technologist: Pilar Jarvis RVT    Examination Guidelines: A complete evaluation includes at minimum, Doppler waveform signals and systolic blood pressure reading at the level of bilateral brachial, anterior tibial, and posterior tibial arteries, when vessel segments are accessible. Bilateral testing is considered an integral part of a complete examination. Photoelectric Plethysmograph (PPG) waveforms and toe systolic pressure readings are included as required and additional duplex testing as needed. Limited examinations for reoccurring indications may be performed as noted.    ABI Findings: +---------+------------------+-----+----------+-----------+ Right    Rt Pressure (mmHg)IndexWaveform  Comment     +---------+------------------+-----+----------+-----------+ Brachial 157  triphasic              +---------+------------------+-----+----------+-----------+ CFA                             monophasic            +---------+------------------+-----+----------+-----------+ Popliteal                       biphasic              +---------+------------------+-----+----------+-----------+ PTA      133               0.85 triphasic             +---------+------------------+-----+----------+-----------+ PERO     127               0.81           multiphasis +---------+------------------+-----+----------+-----------+ DP       127               0.81 triphasic             +---------+------------------+-----+----------+-----------+ Great Toe49                0.31 Abnormal              +---------+------------------+-----+----------+-----------+  +---------+------------------+-----+---------+-------+ Left     Lt Pressure (mmHg)IndexWaveform Comment +---------+------------------+-----+---------+-------+ Brachial 150                    triphasic        +---------+------------------+-----+---------+-------+ CFA                             triphasic        +---------+------------------+-----+---------+-------+ Popliteal                       biphasic         +---------+------------------+-----+---------+-------+ PTA      133               0.85 triphasic        +---------+------------------+-----+---------+-------+ PERO     122               0.78 triphasic        +---------+------------------+-----+---------+-------+ DP       119               0.76 triphasic        +---------+------------------+-----+---------+-------+ Great Toe86                0.55 Normal           +---------+------------------+-----+---------+-------+  +-------+-----------+-----------+------------+------------+ ABI/TBIToday's ABIToday's TBIPrevious ABIPrevious TBI +-------+-----------+-----------+------------+------------+ Right  0.85        0.31                                +-------+-----------+-----------+------------+------------+ Left   0.85       0.55                                +-------+-----------+-----------+------------+------------+        Summary: Right: Resting right ankle-brachial index indicates mild right lower extremity arterial disease. The right toe-brachial index is abnormal.  Left: Resting left ankle-brachial index indicates mild left lower  extremity arterial disease. The left toe-brachial index is abnormal.   *See table(s) above for measurements and observations.   Suggest follow up study in 12 months. Vascular consult recommended.  Summary:  Right: No stenosis seen.   Left: No stenosis seen    See aorto-iliac duplex exam done on 11/13/21.     Myoview lexsican 03/2021 The left ventricular ejection fraction is hyperdynamic (>65%). Nuclear stress EF: 69%. There was no ST segment deviation noted during stress. No T wave inversion was noted during stress. Normal myocardial perfusion with no evidence of ischemia or infarction. This is a low risk study.   Gwyndolyn Kaufman, MD  EKG:  EKG is ordered today.  The ekg ordered today demonstrates NSR, 75bpm, nonspecific ST changes  Recent Labs: 01/23/2022: ALT 15; BUN 14; Creatinine, Ser 0.97; Potassium 4.4; Sodium 135  Recent Lipid Panel    Component Value Date/Time   CHOL 94 01/23/2022 0742   CHOL 123 06/25/2017 0734   TRIG 105.0 01/23/2022 0742   HDL 31.10 (L) 01/23/2022 0742   HDL 28 (L) 06/25/2017 0734   CHOLHDL 3 01/23/2022 0742   VLDL 21.0 01/23/2022 0742   LDLCALC 42 01/23/2022 0742   LDLCALC 64 06/25/2017 0734     Physical Exam:    VS:  BP 120/70 (BP Location: Left Arm, Patient Position: Sitting, Cuff Size: Large)   Pulse 75   Ht '5\' 6"'$  (1.676 m)   Wt 191 lb 8 oz (86.9 kg)   SpO2 97%   BMI 30.91 kg/m     Wt Readings from Last 3 Encounters:  10/12/22 191 lb 8 oz (86.9 kg)  08/03/22 193 lb 8 oz (87.8  kg)  04/08/22 187 lb 6 oz (85 kg)     GEN:  Well nourished, well developed in no acute distress HEENT: Normal NECK: No JVD; No carotid bruits LYMPHATICS: No lymphadenopathy CARDIAC: RRR, no murmurs, rubs, gallops RESPIRATORY:  Clear to auscultation without rales, wheezing or rhonchi  ABDOMEN: Soft, non-tender, non-distended MUSCULOSKELETAL:  No edema; No deformity  SKIN: Warm and dry NEUROLOGIC:  Alert and oriented x 3 PSYCHIATRIC:  Normal affect   ASSESSMENT:    1. PAD (peripheral artery disease) ([place])   2. Essential hypertension   3. Carotid artery disease, unspecified laterality ([place])   4. Tobacco abuse   5. Hyperlipidemia, mixed    PLAN:    In order of problems listed above:  PAD Patient had bilateral ABIs in 2023 that were mildly reduced.Duplex showed no significant infrainguinal disease, but there was b/l common iliac artery stenosis. Patient reports she is unable to walk far distances due to hip and back pain. She denies claudication at res. She is taking Aspirin, Plavix and Crestor. Patient reports unchanged symptoms. We will continue to watch and wait at this point.   Bilateral carotid disease Doppler in 02/2022 showed moderate disease bilaterally at 40-59% stenosis. Continue aggressive medical therapy. Plan for 1 year repeat study.  CAD s/p remote stenting The patient denies chest pain. She is on long-term DAPT with Aspirin and Plavix. Continue Coreg, Losartan, Crestor and SL NTG. No further ischemic work-up indicated today.   Tobacco use Patient is still smoking, cessation advised.   HLD LDL 42 in 01/2022. Continue satin therapy.   Disposition: Follow up in 6 month(s) with MD      Signed, Kristeena Meineke Ninfa Meeker, PA-C  10/12/2022 9:54 AM    [facility]

## 2022-10-19 ENCOUNTER — Other Ambulatory Visit: Payer: Self-pay | Admitting: Cardiovascular Disease

## 2022-10-19 DIAGNOSIS — E7800 Pure hypercholesterolemia, unspecified: Secondary | ICD-10-CM

## 2022-10-20 MED ORDER — ROSUVASTATIN CALCIUM 40 MG PO TABS: 40.0000 mg | ORAL_TABLET | Freq: Every day | ORAL | 1 refills | Status: DC

## 2022-10-22 ENCOUNTER — Other Ambulatory Visit: Payer: Self-pay | Admitting: Pulmonary Disease

## 2022-11-02 LAB — HM DIABETES EYE EXAM

## 2022-11-09 ENCOUNTER — Encounter: Payer: Self-pay | Admitting: Family Medicine

## 2022-11-09 ENCOUNTER — Other Ambulatory Visit: Payer: Self-pay | Admitting: Pulmonary Disease

## 2022-11-13 ENCOUNTER — Ambulatory Visit: Payer: 59 | Admitting: Primary Care

## 2022-11-13 ENCOUNTER — Encounter: Payer: Self-pay | Admitting: Primary Care

## 2022-11-13 VITALS — BP 120/70 | HR 75 | Temp 97.6°F | Ht 66.0 in | Wt 192.0 lb

## 2022-11-13 DIAGNOSIS — J449 Chronic obstructive pulmonary disease, unspecified: Secondary | ICD-10-CM | POA: Diagnosis not present

## 2022-11-13 DIAGNOSIS — F17200 Nicotine dependence, unspecified, uncomplicated: Secondary | ICD-10-CM | POA: Diagnosis not present

## 2022-11-13 DIAGNOSIS — G4733 Obstructive sleep apnea (adult) (pediatric): Secondary | ICD-10-CM

## 2022-11-13 DIAGNOSIS — G4730 Sleep apnea, unspecified: Secondary | ICD-10-CM | POA: Insufficient documentation

## 2022-11-13 DIAGNOSIS — R918 Other nonspecific abnormal finding of lung field: Secondary | ICD-10-CM | POA: Diagnosis not present

## 2022-11-13 MED ORDER — VARENICLINE TARTRATE (STARTER) 0.5 MG X 11 & 1 MG X 42 PO TBPK: ORAL_TABLET | ORAL | 0 refills | Status: DC

## 2022-11-13 MED ORDER — NICOTINE 14 MG/24HR TD PT24: 14.0000 mg | MEDICATED_PATCH | Freq: Every day | TRANSDERMAL | 0 refills | Status: AC

## 2022-11-13 NOTE — Progress Notes (Signed)
$'@Patient'g$  ID: Karen Osborne, female    DOB: [birth year], 67 y.o.   MRN: [id number]  Chief Complaint  Patient presents with   Follow-up    CPAP has been wearing due to the mask not fitting right. Cough with clear sputum.     Referring provider: [name], MD  HPI: 67 year old female,  current smoker. PMH significant for COPD, pulmonary nodules, CAD, HTN, hyperlipidemia, type 2 diabetes.   11/13/2022 Patient presents today for over due follow-up. She had home sleep study on 09/28/20 that showed mild OSA, AHI 5.8/hour. She was started on auto CPAP and worse for about one week before stopping d/t issues with mask fit. She would like to resume use. Needs mask fitting with DME company.   She is ca current smoker but wants to quit. She needs refills of nicotine patch and chantix. She smokes 1/2 ppd. She has noticed her cough is getting worse. She gets up clear sputum. Breathing is ok. She is complaint with Trelegy 151mg daily. No overt wheezing or chest tightness. Following with lung cancer screening program.    Allergies  Allergen Reactions   Penicillins Rash    Immunization History  Administered Date(s) Administered   Fluad Quad(high Dose 65+) 08/03/2022   Influenza, Quadrivalent, Recombinant, Inj, Pf 06/03/2019   Influenza,inj,Quad PF,6+ Mos 05/28/2016, 07/05/2018, 09/28/2020   Influenza-Unspecified 07/24/2017   PFIZER(Purple Top)SARS-COV-2 Vaccination 12/06/2019, 01/03/2020, 09/28/2020   Pneumococcal Polysaccharide-23 01/27/2012   Td 09/14/2000   Tdap 01/24/2021   Zoster Recombinat (Shingrix) 01/23/2020, 01/24/2021    Past Medical History:  Diagnosis Date   BRCA negative 08/2015   MyRisk neg   Chronic diastolic CHF (congestive heart failure) (HCC)    COPD (chronic obstructive pulmonary disease) with emphysema ([place]   Coronary artery disease    a. s/p NSTEMI - LHC 01/27/12: prox LAD 20%, mid LAD 99%, prox OM 30%, prox RCA 30%, mid RCA 50%, dist RCA 30%, EF 60%.   PCI:  2.25 x 16 mm Promus Element DES to mid LAD //  b. Myoview (7/15):  Fixed apical defect (breast attenuation versus scar), no ischemia, EF 64%; low risk  // Myoview 7/22: EF 69, no ischemia or infarction; low risk   COVID-19 virus infection 07/2021   Rx molnupiravir -> rash so stopped early   Depression    Diabetes mellitus without complication ([place] [id number]  Family history of ovarian cancer    Glucose intolerance (impaired glucose tolerance)    A1C 6.1 01/2012   History of echocardiogram    Echo (01/28/12):  Mild LVH, EF 55%, apical septum and true apex severe HK, Gr 2 DD, mild LAE, normal RVF // Echo 7/18: mild LVH, EF 60-65, no RWMA, Gr 2 DD, normal RVF   History of nuclear stress test    Nuclear stress test 4/19: EF 74, apicoseptal defect c/w atten artifact, normal perfusion; Low Risk   Hypercholesteremia    Hypertension    Myocardial infarction ([place]    NSVD (normal spontaneous vaginal delivery) 1988   Osteopenia 2016   hip   Osteoporosis 2019   [facility]  Restless leg syndrome    Tobacco abuse     Tobacco History: Social History   Tobacco Use  Smoking Status Every Day   Packs/day: 2.00   Years: 48.40   Total pack years: 96.80   Types: Cigarettes  Smokeless Tobacco Never  Tobacco Comments   0.5PPD 11/13/2022   Ready to quit:  Not Answered Counseling given: Not Answered Tobacco comments: 0.5PPD 11/13/2022   Outpatient Medications Prior to Visit  Medication Sig Dispense Refill   albuterol (VENTOLIN HFA) 108 (90 Base) MCG/ACT inhaler TAKE 2 PUFFS BY MOUTH EVERY 6 HOURS AS NEEDED FOR WHEEZE OR SHORTNESS OF BREATH 8.5 each 6   alendronate (FOSAMAX) 70 MG tablet Take 1 tablet (70 mg total) by mouth every 7 (seven) days. Take with a full glass of water on an empty stomach. 4 tablet 11   aspirin EC 81 MG tablet Take 1 tablet (81 mg total) by mouth daily. Swallow whole.     Blood Glucose Monitoring Suppl (ONE TOUCH ULTRA 2) w/Device KIT 1 each by Does  not apply route daily. Use as directed to check sugar once a day 1 each 0   carvedilol (COREG) 6.25 MG tablet TAKE 1 TABLET BY MOUTH TWICE DAILY WITH A MEAL 180 tablet 0   Cholecalciferol (VITAMIN D) 125 MCG (5000 UT) CAPS Take 1 capsule by mouth every Monday, Wednesday, and Friday.     clopidogrel (PLAVIX) 75 MG tablet TAKE 1 TABLET BY MOUTH EVERY DAY 90 tablet 3   Coenzyme Q10 100 MG capsule Take 1 capsule (100 mg total) by mouth daily.     Cyanocobalamin (B-12) 1000 MCG SUBL Place 1 tablet under the tongue every Monday, Wednesday, and Friday.     Dulaglutide (TRULICITY) A999333 0000000 SOPN Inject 0.75 mg into the skin once a week. 2 mL 6   Fluticasone-Umeclidin-Vilant (TRELEGY ELLIPTA) 100-62.5-25 MCG/ACT AEPB INHALE 1 PUFF BY MOUTH EVERY DAY 60 each 0   glucose blood (ONE TOUCH ULTRA TEST) test strip 1 each by Other route daily. Use as instructed to check sugar once a day. 100 each 0   hydrochlorothiazide (MICROZIDE) 12.5 MG capsule Take 1 capsule (12.5 mg total) by mouth daily. 90 capsule 2   losartan (COZAAR) 50 MG tablet Take 1 tablet (50 mg total) by mouth daily. 90 tablet 3   metFORMIN (GLUCOPHAGE-XR) 500 MG 24 hr tablet Take 2 tablets (1,000 mg total) by mouth daily. 180 tablet 3   nitroGLYCERIN (NITROSTAT) 0.4 MG SL tablet Place 1 tablet (0.4 mg total) under the tongue every 5 (five) minutes as needed for chest pain (up to 3 doses). 25 tablet 3   ONETOUCH DELICA LANCETS 99991111 MISC 1 each by Does not apply route daily. Use as directed to check sugar once a day. 100 each 0   rOPINIRole (REQUIP) 1 MG tablet TAKE 1 TO 2 TABLETS BY MOUTH AT BEDTIME 180 tablet 2   rosuvastatin (CRESTOR) 40 MG tablet Take 1 tablet (40 mg total) by mouth daily. 90 tablet 1   nicotine (NICODERM CQ - DOSED IN MG/24 HOURS) 14 mg/24hr patch Place 1 patch (14 mg total) onto the skin daily. (Patient not taking: Reported on 08/03/2022) 28 patch 0   varenicline (CHANTIX) 1 MG tablet Take 1 tablet (1 mg total) by mouth 2  (two) times daily. (Patient not taking: Reported on 08/03/2022) 60 tablet 3   No facility-administered medications prior to visit.   Review of Systems  Review of Systems  Constitutional: Negative.   HENT: Negative.    Respiratory:  Positive for cough. Negative for chest tightness, shortness of breath and wheezing.   Cardiovascular: Negative.   Psychiatric/Behavioral:  Positive for sleep disturbance.      Physical Exam  BP 120/70 (BP Location: Left Arm, Cuff Size: Large)   Pulse 75   Temp 97.6 F (36.4 C)  Ht '5\' 6"'$  (1.676 m)   Wt 192 lb (87.1 kg)   SpO2 100%   BMI 30.99 kg/m  Physical Exam Constitutional:      Appearance: Normal appearance.  HENT:     Head: Normocephalic and atraumatic.     Mouth/Throat:     Mouth: Mucous membranes are moist.     Pharynx: Oropharynx is clear.  Cardiovascular:     Rate and Rhythm: Normal rate and regular rhythm.  Pulmonary:     Effort: Pulmonary effort is normal.     Breath sounds: Normal breath sounds. No wheezing, rhonchi or rales.  Musculoskeletal:        General: Normal range of motion.  Skin:    General: Skin is warm and dry.  Neurological:     General: No focal deficit present.     Mental Status: She is alert and oriented to person, place, and time. Mental status is at baseline.  Psychiatric:        Mood and Affect: Mood normal.        Behavior: Behavior normal.        Thought Content: Thought content normal.        Judgment: Judgment normal.      Lab Results:  CBC    Component Value Date/Time   WBC 9.0 01/07/2018 0909   WBC 9.7 01/28/2012 0146   RBC 4.65 01/07/2018 0909   RBC 4.61 01/28/2012 0146   HGB 14.6 01/07/2018 0909   HCT 42.5 01/07/2018 0909   PLT 271 01/07/2018 0909   MCV 91 01/07/2018 0909   MCH 31.4 01/07/2018 0909   MCH 31.5 01/28/2012 0146   MCHC 34.4 01/07/2018 0909   MCHC 34.9 01/28/2012 0146   RDW 12.9 01/07/2018 0909   LYMPHSABS 3.1 11/03/2011 1618   MONOABS 0.7 11/03/2011 1618   EOSABS  0.4 11/03/2011 1618   BASOSABS 0.0 11/03/2011 1618    BMET    Component Value Date/Time   NA 135 01/23/2022 0742   NA 137 02/04/2018 0735   K 4.4 01/23/2022 0742   CL 99 01/23/2022 0742   CO2 28 01/23/2022 0742   GLUCOSE 136 (H) 01/23/2022 0742   BUN 14 01/23/2022 0742   BUN 14 02/04/2018 0735   CREATININE 0.97 01/23/2022 0742   CALCIUM 9.3 01/23/2022 0742   GFRNONAA 56 (L) 02/04/2018 0735   GFRAA 65 02/04/2018 0735    BNP No results found for: "BNP"  ProBNP    Component Value Date/Time   PROBNP 20 01/07/2018 0909    Imaging: No results found.   Assessment & Plan:   COPD (chronic obstructive pulmonary disease) ([place]) - Stable; No acute respiratory symptoms. Chronic cough with clear sputum.  - Continue Trelegy 168mg one puff daily and prn Albuterol hfa 2 puffs q 6 hours - Smoking cessation strongly encouraged   Pulmonary nodules - Following with lung cancer screening program  TOBACCO ABUSE - Current smoker, 1/2 ppd - Encourage she taper amount and pick quit date - Refilling chantix starter pack and nicotine patch 14gm daily   Mild sleep apnea - HST 09/28/20>> AHI 5.8/hour. Not currently wearing CPAP. Needs mask fitting with DME. Encourage patient resume CPAP use, if unable to tolerate ok to conservatively manage with weight loss and lateral sleep position      EMartyn Ehrich NP 11/13/2022

## 2022-11-13 NOTE — Assessment & Plan Note (Signed)
-   Current smoker, 1/2 ppd - Encourage she taper amount and pick quit date - Refilling chantix starter pack and nicotine patch 14gm daily

## 2022-11-13 NOTE — Assessment & Plan Note (Signed)
-   Following with lung cancer screening program

## 2022-11-13 NOTE — Assessment & Plan Note (Signed)
-   HST 09/28/20>> AHI 5.8/hour. Not currently wearing CPAP. Needs mask fitting with DME. Encourage patient resume CPAP use, if unable to tolerate ok to conservatively manage with weight loss and lateral sleep position

## 2022-11-13 NOTE — Assessment & Plan Note (Signed)
-   Stable; No acute respiratory symptoms. Chronic cough with clear sputum.  - Continue Trelegy 171mg one puff daily and prn Albuterol hfa 2 puffs q 6 hours - Smoking cessation strongly encouraged

## 2022-11-13 NOTE — Patient Instructions (Addendum)
Recommendations: Resume CPAP use nightly 4-6 hours or longer Start chantix and nicotine patch Taper amount you are smoking and pick quit day Take mucinex '600mg'$  twice daily as needed to loosen congestion Continue Trelegy Get RSV vaccine from pharmacy   Orders: Mask fitting with DME company   Follow-up: 6-8 weeks with Eustaquio Maize NP

## 2022-11-14 NOTE — Progress Notes (Signed)
Reviewed and agree with assessment/plan.   Chesley Mires, MD [facility] Pulmonary/Critical Care 11/14/2022, 1:17 PM Pager:  [phone]

## 2022-12-10 ENCOUNTER — Ambulatory Visit: Payer: Self-pay | Admitting: Obstetrics and Gynecology

## 2022-12-18 ENCOUNTER — Other Ambulatory Visit: Payer: Self-pay | Admitting: Cardiovascular Disease

## 2022-12-18 ENCOUNTER — Other Ambulatory Visit: Payer: Self-pay | Admitting: Primary Care

## 2022-12-18 DIAGNOSIS — I10 Essential (primary) hypertension: Secondary | ICD-10-CM

## 2022-12-18 DIAGNOSIS — I2510 Atherosclerotic heart disease of native coronary artery without angina pectoris: Secondary | ICD-10-CM

## 2022-12-18 NOTE — Telephone Encounter (Signed)
I believer I had sent in both starter and continuing pack- We dont need to refill started pack

## 2022-12-18 NOTE — Telephone Encounter (Signed)
Please advise on refill request

## 2022-12-21 MED ORDER — CLOPIDOGREL BISULFATE 75 MG PO TABS: 75.0000 mg | ORAL_TABLET | Freq: Every day | ORAL | 0 refills | Status: DC

## 2022-12-21 NOTE — Telephone Encounter (Signed)
Good afternoon Karen Osborne! Your prescription has been sent as requested. Thank you! Have a great day! Gearldine Bienenstock

## 2022-12-25 ENCOUNTER — Telehealth: Payer: 59 | Admitting: Primary Care

## 2023-01-05 ENCOUNTER — Telehealth: Payer: 59 | Admitting: Primary Care

## 2023-01-07 ENCOUNTER — Other Ambulatory Visit: Payer: Self-pay | Admitting: Family Medicine

## 2023-01-07 DIAGNOSIS — I10 Essential (primary) hypertension: Secondary | ICD-10-CM

## 2023-01-07 NOTE — Telephone Encounter (Signed)
E-scribed refill.  Plz schedule CPE and fasting lab visits for additional refills. 

## 2023-01-08 NOTE — Telephone Encounter (Signed)
Spoke to pt, scheduled cpe for 02/16/23

## 2023-01-28 MED ORDER — TRELEGY ELLIPTA 100-62.5-25 MCG/ACT IN AEPB: INHALATION_SPRAY | RESPIRATORY_TRACT | 5 refills | Status: DC

## 2023-02-08 ENCOUNTER — Other Ambulatory Visit: Payer: Self-pay | Admitting: Family Medicine

## 2023-02-08 DIAGNOSIS — E538 Deficiency of other specified B group vitamins: Secondary | ICD-10-CM

## 2023-02-08 DIAGNOSIS — E1169 Type 2 diabetes mellitus with other specified complication: Secondary | ICD-10-CM

## 2023-02-08 DIAGNOSIS — M810 Age-related osteoporosis without current pathological fracture: Secondary | ICD-10-CM

## 2023-02-11 ENCOUNTER — Other Ambulatory Visit (INDEPENDENT_AMBULATORY_CARE_PROVIDER_SITE_OTHER): Payer: Managed Care, Other (non HMO)

## 2023-02-11 DIAGNOSIS — E785 Hyperlipidemia, unspecified: Secondary | ICD-10-CM | POA: Diagnosis not present

## 2023-02-11 DIAGNOSIS — M810 Age-related osteoporosis without current pathological fracture: Secondary | ICD-10-CM

## 2023-02-11 DIAGNOSIS — E1169 Type 2 diabetes mellitus with other specified complication: Secondary | ICD-10-CM | POA: Diagnosis not present

## 2023-02-11 DIAGNOSIS — E538 Deficiency of other specified B group vitamins: Secondary | ICD-10-CM | POA: Diagnosis not present

## 2023-02-11 LAB — LIPID PANEL
Cholesterol: 109 mg/dL (ref 0–200)
HDL: 28.3 mg/dL — ABNORMAL LOW (ref >39.00)
LDL Cholesterol: 40 mg/dL (ref 0–99)
NonHDL: 80.2
Total CHOL/HDL Ratio: 4
Triglycerides: 199 mg/dL — ABNORMAL HIGH (ref 0.0–149.0)
VLDL: 39.8 mg/dL (ref 0.0–40.0)

## 2023-02-11 LAB — COMPREHENSIVE METABOLIC PANEL
ALT: 13 U/L (ref 0–35)
AST: 14 U/L (ref 0–37)
Albumin: 4.2 g/dL (ref 3.5–5.2)
Alkaline Phosphatase: 66 U/L (ref 39–117)
BUN: 13 mg/dL (ref 6–23)
CO2: 30 mEq/L (ref 19–32)
Calcium: 10 mg/dL (ref 8.4–10.5)
Chloride: 98 mEq/L (ref 96–112)
Creatinine, Ser: 0.97 mg/dL (ref 0.40–1.20)
GFR: 61.01 mL/min (ref >60.00)
Glucose, Bld: 117 mg/dL — ABNORMAL HIGH (ref 70–99)
Potassium: 4 mEq/L (ref 3.5–5.1)
Sodium: 138 mEq/L (ref 135–145)
Total Bilirubin: 0.5 mg/dL (ref 0.2–1.2)
Total Protein: 6.9 g/dL (ref 6.0–8.3)

## 2023-02-11 LAB — MICROALBUMIN / CREATININE URINE RATIO
Creatinine,U: 15.8 mg/dL
Microalb Creat Ratio: 5.7 mg/g (ref 0.0–30.0)
Microalb, Ur: 0.9 mg/dL (ref 0.0–1.9)

## 2023-02-11 LAB — TSH: TSH: 2.3 u[IU]/mL (ref 0.35–5.50)

## 2023-02-11 LAB — HEMOGLOBIN A1C: Hgb A1c MFr Bld: 7.4 % — ABNORMAL HIGH (ref 4.6–6.5)

## 2023-02-11 LAB — VITAMIN B12: Vitamin B-12: 540 pg/mL (ref 211–911)

## 2023-02-11 LAB — VITAMIN D 25 HYDROXY (VIT D DEFICIENCY, FRACTURES): VITD: 54.4 ng/mL (ref 30.00–100.00)

## 2023-02-16 ENCOUNTER — Ambulatory Visit: Payer: Managed Care, Other (non HMO) | Admitting: Family Medicine

## 2023-02-16 ENCOUNTER — Encounter: Payer: Self-pay | Admitting: Family Medicine

## 2023-02-16 VITALS — BP 128/82 | HR 78 | Temp 97.5°F | Ht 64.25 in | Wt 183.4 lb

## 2023-02-16 DIAGNOSIS — Z0000 Encounter for general adult medical examination without abnormal findings: Secondary | ICD-10-CM | POA: Diagnosis not present

## 2023-02-16 DIAGNOSIS — I6529 Occlusion and stenosis of unspecified carotid artery: Secondary | ICD-10-CM

## 2023-02-16 DIAGNOSIS — Z23 Encounter for immunization: Secondary | ICD-10-CM

## 2023-02-16 DIAGNOSIS — M810 Age-related osteoporosis without current pathological fracture: Secondary | ICD-10-CM

## 2023-02-16 DIAGNOSIS — G2581 Restless legs syndrome: Secondary | ICD-10-CM

## 2023-02-16 DIAGNOSIS — I77811 Abdominal aortic ectasia: Secondary | ICD-10-CM

## 2023-02-16 DIAGNOSIS — I10 Essential (primary) hypertension: Secondary | ICD-10-CM | POA: Diagnosis not present

## 2023-02-16 DIAGNOSIS — M791 Myalgia, unspecified site: Secondary | ICD-10-CM

## 2023-02-16 DIAGNOSIS — E538 Deficiency of other specified B group vitamins: Secondary | ICD-10-CM

## 2023-02-16 DIAGNOSIS — G4730 Sleep apnea, unspecified: Secondary | ICD-10-CM

## 2023-02-16 DIAGNOSIS — I2510 Atherosclerotic heart disease of native coronary artery without angina pectoris: Secondary | ICD-10-CM

## 2023-02-16 DIAGNOSIS — F418 Other specified anxiety disorders: Secondary | ICD-10-CM

## 2023-02-16 DIAGNOSIS — I708 Atherosclerosis of other arteries: Secondary | ICD-10-CM

## 2023-02-16 DIAGNOSIS — F17200 Nicotine dependence, unspecified, uncomplicated: Secondary | ICD-10-CM

## 2023-02-16 DIAGNOSIS — E1169 Type 2 diabetes mellitus with other specified complication: Secondary | ICD-10-CM | POA: Diagnosis not present

## 2023-02-16 DIAGNOSIS — I739 Peripheral vascular disease, unspecified: Secondary | ICD-10-CM

## 2023-02-16 DIAGNOSIS — J449 Chronic obstructive pulmonary disease, unspecified: Secondary | ICD-10-CM

## 2023-02-16 DIAGNOSIS — E785 Hyperlipidemia, unspecified: Secondary | ICD-10-CM

## 2023-02-16 DIAGNOSIS — I700 Atherosclerosis of aorta: Secondary | ICD-10-CM

## 2023-02-16 DIAGNOSIS — Z7984 Long term (current) use of oral hypoglycemic drugs: Secondary | ICD-10-CM

## 2023-02-16 DIAGNOSIS — Z7189 Other specified counseling: Secondary | ICD-10-CM

## 2023-02-16 MED ORDER — COENZYME Q10 100 MG PO CAPS: 100.0000 mg | ORAL_CAPSULE | Freq: Two times a day (BID) | ORAL | Status: AC

## 2023-02-16 MED ORDER — LOSARTAN POTASSIUM 50 MG PO TABS: 50.0000 mg | ORAL_TABLET | Freq: Every day | ORAL | 4 refills | Status: DC

## 2023-02-16 MED ORDER — ALENDRONATE SODIUM 70 MG PO TABS: 70.0000 mg | ORAL_TABLET | ORAL | 4 refills | Status: DC

## 2023-02-16 MED ORDER — ROPINIROLE HCL 1 MG PO TABS
1.0000 mg | ORAL_TABLET | Freq: Every evening | ORAL | 4 refills | Status: DC
Start: 2023-02-16 — End: 2023-06-24

## 2023-02-16 MED ORDER — TRULICITY 1.5 MG/0.5ML ~~LOC~~ SOAJ
1.5000 mg | SUBCUTANEOUS | 11 refills | Status: DC
Start: 2023-02-16 — End: 2024-02-18

## 2023-02-16 MED ORDER — TRULICITY 0.75 MG/0.5ML ~~LOC~~ SOAJ: 0.7500 mg | SUBCUTANEOUS | 6 refills | Status: DC

## 2023-02-16 MED ORDER — METFORMIN HCL ER 500 MG PO TB24: 1000.0000 mg | ORAL_TABLET | Freq: Every day | ORAL | 4 refills | Status: DC

## 2023-02-16 NOTE — Assessment & Plan Note (Signed)
Preventative protocols reviewed and updated unless pt declined. Discussed healthy diet and lifestyle.  

## 2023-02-16 NOTE — Assessment & Plan Note (Signed)
Working on this

## 2023-02-16 NOTE — Progress Notes (Signed)
Ph: [phone] Fax: [phone]   Patient ID: Karen Osborne, female    DOB: 11-Oct-1955, 67 y.o.   MRN: [id number]  This visit was conducted in person.  BP 128/82   Pulse 78   Temp (!) 97.5 F (36.4 C) (Temporal)   Ht 5' 4.25" (1.632 m)   Wt 183 lb 6 oz (83.2 kg)   SpO2 95%   BMI 31.23 kg/m    CC: CPE Subjective:   HPI: LEASA BLAZE is a 67 y.o. female presenting on 02/16/2023 for Annual Exam   Medicare A.   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  Hasn't started chantix yet.   DM - only recently started trulicity 0.75mg  weekly. Continues metformin XR 500mg  BID.   Known CAD s/p NSTEMI 2013 with DES to LAD. Myoview 2019 low risk. Continues plavix, carvedilol, rosuvastatin. Upcoming carotid US and LE arterial circulation evaluation per pt.   OSA - doesn't regularly use CPAP.   RLS - takes requip 1mg  nightly and drinks water. Also on CoQ10 100mg  daily with significant benefit in myalgias. Some nausea to medicine.   Takes advil PM 2 tab nightly. Trouble tolerating melatonin.    Preventative: COLONOSCOPY WITH PROPOFOL 08/19/2018 - TAs, rpt 3 yrs Maximino Greenland, [place] B, MD) - DUE Mammo 06/2021 - Birads2 @ [facility]  Well woman exam with [facility] - s/p TAH w/ BSO for heavy bleeding 20 yrs ago - sees Q1-2 yrs  Lung cancer screening - undergoing, latest 07/2022 - known CAD, aort ATH, COPD.  DEXA 02/2022 -  T -3.4 spine, -3.0 total L hip - fosamax started 03/2022 Flu shot - yearly Tetanus shot - 2002, Tdap 01/2021 COVID vaccine Pfizer 11/2019, 12/2019, booster 09/2020 Pneumovax 2013, Prevnar-20 today  Shingrix - 01/2020, 01/2021 Advanced directive discussion: does not have. Packet provided today. Unsure who HCPOA would be, probably sister Alcus Dad.  Seat belt use discussed  Sunscreen use discussed. No changing moles on skin. Saw [place] derm 08/2021.  Current smoker <1/2 ppd, hasn't started chantix yet.  Alcohol - none  Dentist - full dentures  placed 08/2020  Eye exam - yearly  Bowel - some constipation managed with OTC exlax.  Bladder - no incontinence  Lives with husband and son Occ: sales (pipe fitting and valves) Activity: some walking Diet: good water, fruits/vegetables daily     Relevant past medical, surgical, family and social history reviewed and updated as indicated. Interim medical history since our last visit reviewed. Allergies and medications reviewed and updated. Outpatient Medications Prior to Visit  Medication Sig Dispense Refill   albuterol (VENTOLIN HFA) 108 (90 Base) MCG/ACT inhaler TAKE 2 PUFFS BY MOUTH EVERY 6 HOURS AS NEEDED FOR WHEEZE OR SHORTNESS OF BREATH 8.5 each 6   aspirin EC 81 MG tablet Take 1 tablet (81 mg total) by mouth daily. Swallow whole.     Blood Glucose Monitoring Suppl (ONE TOUCH ULTRA 2) w/Device KIT 1 each by Does not apply route daily. Use as directed to check sugar once a day 1 each 0   carvedilol (COREG) 6.25 MG tablet TAKE 1 TABLET BY MOUTH TWICE A DAY WITH FOOD 180 tablet 0   Cholecalciferol (VITAMIN D) 125 MCG (5000 UT) CAPS Take 1 capsule by mouth every Monday, Wednesday, and Friday.     clopidogrel (PLAVIX) 75 MG tablet Take 1 tablet (75 mg total) by mouth daily. 90 tablet 0   Cyanocobalamin (B-12) 1000 MCG SUBL Place 1 tablet under the tongue every  Monday, Wednesday, and Friday.     Fluticasone-Umeclidin-Vilant (TRELEGY ELLIPTA) 100-62.5-25 MCG/ACT AEPB INHALE 1 PUFF BY MOUTH EVERY DAY 60 each 5   glucose blood (ONE TOUCH ULTRA TEST) test strip 1 each by Other route daily. Use as instructed to check sugar once a day. 100 each 0   hydrochlorothiazide (MICROZIDE) 12.5 MG capsule Take 1 capsule (12.5 mg total) by mouth daily. 90 capsule 2   nicotine (NICODERM CQ - DOSED IN MG/24 HOURS) 14 mg/24hr patch Place 1 patch (14 mg total) onto the skin daily. 28 patch 0   nitroGLYCERIN (NITROSTAT) 0.4 MG SL tablet Place 1 tablet (0.4 mg total) under the tongue every 5 (five) minutes as  needed for chest pain (up to 3 doses). 25 tablet 3   ONETOUCH DELICA LANCETS 33G MISC 1 each by Does not apply route daily. Use as directed to check sugar once a day. 100 each 0   rosuvastatin (CRESTOR) 40 MG tablet Take 1 tablet (40 mg total) by mouth daily. 90 tablet 1   Varenicline Tartrate, Starter, (CHANTIX STARTING MONTH PAK) 0.5 MG X 11 & 1 MG X 42 TBPK Take one 0.5 mg tablet by mouth once daily for 3 days, then increase to one 0.5 mg tablet twice daily for 4 days, then increase to one 1 mg tablet twice daily. 60 each 0   alendronate (FOSAMAX) 70 MG tablet Take 1 tablet (70 mg total) by mouth every 7 (seven) days. Take with a full glass of water on an empty stomach. 4 tablet 11   Coenzyme Q10 100 MG capsule Take 1 capsule (100 mg total) by mouth daily.     Dulaglutide (TRULICITY) 0.75 MG/0.5ML SOPN Inject 0.75 mg into the skin once a week. 2 mL 6   losartan (COZAAR) 50 MG tablet TAKE 1 TABLET BY MOUTH EVERY DAY 90 tablet 0   metFORMIN (GLUCOPHAGE-XR) 500 MG 24 hr tablet Take 2 tablets (1,000 mg total) by mouth daily. 180 tablet 3   rOPINIRole (REQUIP) 1 MG tablet TAKE 1 TO 2 TABLETS BY MOUTH AT BEDTIME 180 tablet 2   No facility-administered medications prior to visit.     Per HPI unless specifically indicated in ROS section below Review of Systems  Constitutional:  Negative for activity change, appetite change, chills, fatigue, fever and unexpected weight change.  HENT:  Negative for hearing loss.   Eyes:  Negative for visual disturbance.  Respiratory:  Positive for cough. Negative for chest tightness, shortness of breath and wheezing.   Cardiovascular:  Negative for chest pain, palpitations and leg swelling.  Gastrointestinal:  Positive for constipation. Negative for abdominal distention, abdominal pain, blood in stool, diarrhea, nausea and vomiting.  Genitourinary:  Negative for difficulty urinating and hematuria.  Musculoskeletal:  Negative for arthralgias, myalgias and neck pain.   Skin:  Negative for rash.  Neurological:  Negative for dizziness, seizures, syncope and headaches.  Hematological:  Negative for adenopathy. Bruises/bleeds easily.  Psychiatric/Behavioral:  Negative for dysphoric mood. The patient is not nervous/anxious.     Objective:  BP 128/82   Pulse 78   Temp (!) 97.5 F (36.4 C) (Temporal)   Ht 5' 4.25" (1.632 m)   Wt 183 lb 6 oz (83.2 kg)   SpO2 95%   BMI 31.23 kg/m   Wt Readings from Last 3 Encounters:  02/16/23 183 lb 6 oz (83.2 kg)  11/13/22 192 lb (87.1 kg)  10/12/22 191 lb 8 oz (86.9 kg)      Physical Exam Vitals  and nursing note reviewed.  Constitutional:      Appearance: Normal appearance. She is not ill-appearing.  HENT:     Head: Normocephalic and atraumatic.     Right Ear: Tympanic membrane, ear canal and external ear normal. There is no impacted cerumen.     Left Ear: Tympanic membrane, ear canal and external ear normal. There is no impacted cerumen.     Nose: Nose normal.     Mouth/Throat:     Mouth: Mucous membranes are moist.     Pharynx: Oropharynx is clear. No oropharyngeal exudate or posterior oropharyngeal erythema.  Eyes:     General:        Right eye: No discharge.        Left eye: No discharge.     Extraocular Movements: Extraocular movements intact.     Conjunctiva/sclera: Conjunctivae normal.     Pupils: Pupils are equal, round, and reactive to light.  Neck:     Thyroid: No thyroid mass or thyromegaly.     Vascular: Carotid bruit (L sided) present.  Cardiovascular:     Rate and Rhythm: Normal rate and regular rhythm.     Pulses: Normal pulses.     Heart sounds: Normal heart sounds. No murmur heard. Pulmonary:     Effort: Pulmonary effort is normal. No respiratory distress.     Breath sounds: Normal breath sounds. No wheezing, rhonchi or rales.  Abdominal:     General: Bowel sounds are normal. There is no distension.     Palpations: Abdomen is soft. There is no mass.     Tenderness: There is no  abdominal tenderness. There is no guarding or rebound.     Hernia: No hernia is present.  Musculoskeletal:     Cervical back: Normal range of motion and neck supple. No rigidity.     Right lower leg: No edema.     Left lower leg: No edema.  Lymphadenopathy:     Cervical: No cervical adenopathy.  Skin:    General: Skin is warm and dry.     Findings: No rash.  Neurological:     General: No focal deficit present.     Mental Status: She is alert. Mental status is at baseline.  Psychiatric:        Mood and Affect: Mood normal.        Behavior: Behavior normal.       Results for orders placed or performed in visit on 02/11/23  VITAMIN D 25 Hydroxy (Vit-D Deficiency, Fractures)  Result Value Ref Range   VITD 54.40 30.00 - 100.00 ng/mL  Vitamin B12  Result Value Ref Range   Vitamin B-12 540 211 - 911 pg/mL  TSH  Result Value Ref Range   TSH 2.30 0.35 - 5.50 uIU/mL  Comprehensive metabolic panel  Result Value Ref Range   Sodium 138 135 - 145 mEq/L   Potassium 4.0 3.5 - 5.1 mEq/L   Chloride 98 96 - 112 mEq/L   CO2 30 19 - 32 mEq/L   Glucose, Bld 117 (H) 70 - 99 mg/dL   BUN 13 6 - 23 mg/dL   Creatinine, Ser 5.40 0.40 - 1.20 mg/dL   Total Bilirubin 0.5 0.2 - 1.2 mg/dL   Alkaline Phosphatase 66 39 - 117 U/L   AST 14 0 - 37 U/L   ALT 13 0 - 35 U/L   Total Protein 6.9 6.0 - 8.3 g/dL   Albumin 4.2 3.5 - 5.2 g/dL   GFR 98.11 >91.47  mL/min   Calcium 10.0 8.4 - 10.5 mg/dL  Lipid panel  Result Value Ref Range   Cholesterol 109 0 - 200 mg/dL   Triglycerides 161.0 (H) 0.0 - 149.0 mg/dL   HDL 96.04 (L) >54.09 mg/dL   VLDL 81.1 0.0 - 91.4 mg/dL   LDL Cholesterol 40 0 - 99 mg/dL   Total CHOL/HDL Ratio 4    NonHDL 80.20   Microalbumin / creatinine urine ratio  Result Value Ref Range   Microalb, Ur 0.9 0.0 - 1.9 mg/dL   Creatinine,U 78.2 mg/dL   Microalb Creat Ratio 5.7 0.0 - 30.0 mg/g  Hemoglobin A1c  Result Value Ref Range   Hgb A1c MFr Bld 7.4 (H) 4.6 - 6.5 %      02/16/2023     8:32 AM 01/30/2022    8:10 AM 01/24/2021    8:19 AM 01/23/2020    8:29 AM 02/04/2018    3:34 PM  Depression screen PHQ 2/9  Decreased Interest 0 2 2 2  0  Down, Depressed, Hopeless 0 0 2 1 0  PHQ - 2 Score 0 2 4 3  0  Altered sleeping 3 0 1 2   Tired, decreased energy 1 3 2 2    Change in appetite 0 1 2 1    Feeling bad or failure about yourself  0 0 0 0   Trouble concentrating 0 0 0 2   Moving slowly or fidgety/restless 0 0 0 0   Suicidal thoughts 0 0 0 0   PHQ-9 Score 4 6 9 10    Difficult doing work/chores Not difficult at all Not difficult at all          02/16/2023    8:33 AM 01/30/2022    8:11 AM 01/24/2021    8:19 AM 01/23/2020    8:29 AM  GAD 7 : Generalized Anxiety Score  Nervous, Anxious, on Edge 0 0 2 1  Control/stop worrying 0 0 2 2  Worry too much - different things 0 0 2 2  Trouble relaxing 0 0 1 2  Restless 0 0 1 1  Easily annoyed or irritable 0 3 2 2   Afraid - awful might happen 0 0 3 2  Total GAD 7 Score 0 3 13 12   Anxiety Difficulty Not difficult at all Not difficult at all     Assessment & Plan:   Problem List Items Addressed This Visit     Health maintenance examination - Primary (Chronic)    Preventative protocols reviewed and updated unless pt declined. Discussed healthy diet and lifestyle.       Advanced directives, counseling/discussion (Chronic)    Working on this.       Hyperlipidemia associated with type 2 diabetes mellitus (HCC)    Chronic, stable period on crestor + CoQ10 for myalgias The ASCVD Risk score (Arnett DK, et al., 2019) failed to calculate for the following reasons:   The valid total cholesterol range is 130 to 320 mg/dL       Relevant Medications   losartan (COZAAR) 50 MG tablet   metFORMIN (GLUCOPHAGE-XR) 500 MG 24 hr tablet   Dulaglutide (TRULICITY) 1.5 MG/0.5ML SOPN   TOBACCO ABUSE    Continued smoker. Continues ytearly ung cancer screening CT. Encouraged she try chantix in an effort to fully quit smoking.        RESTLESS LEG SYNDROME    Discussed changing requip from bedtime to 2-3 hours prior to bedtime.  She notes significant benefit with CoQ10 as well.  Relevant Medications   rOPINIRole (REQUIP) 1 MG tablet   Essential hypertension    Chronic, stable on current regimen - continue.       Relevant Medications   losartan (COZAAR) 50 MG tablet   Osteoporosis    Reviewed latest DEXA with worsening T score - fosamax started 03/2022. Will repeat DEXA in 2025      Relevant Medications   alendronate (FOSAMAX) 70 MG tablet   Depression with anxiety    Stable period off medication.       CAD (coronary artery disease)    Continue plavix, statin, BB. Sees cardiology.       Relevant Medications   losartan (COZAAR) 50 MG tablet   Type 2 diabetes mellitus with other specified complication (HCC)    Chronic, remains above goal on metformin XR 500mg  bid, she only recently started trulicity 0.75mg  weekly and is tolerating well. No changes at this time, rec RTC 3 mo DM f/u visit.       Relevant Medications   losartan (COZAAR) 50 MG tablet   metFORMIN (GLUCOPHAGE-XR) 500 MG 24 hr tablet   Dulaglutide (TRULICITY) 1.5 MG/0.5ML SOPN   COPD (chronic obstructive pulmonary disease) (HCC)    Chronic, stable period on Trelegy, followed by pulmonology.       Thoracic aorta atherosclerosis (HCC)    Continue statin, plavix.       Relevant Medications   losartan (COZAAR) 50 MG tablet   Vitamin B12 deficiency    Levels stable on MWF replacement.       Abdominal aortic ectasia (HCC)   Relevant Medications   losartan (COZAAR) 50 MG tablet   Aorto-iliac atherosclerosis (HCC)   Relevant Medications   losartan (COZAAR) 50 MG tablet   Myalgia    Continue CoQ10 with benefit.       Carotid stenosis    Upcoming carotid US      Relevant Medications   losartan (COZAAR) 50 MG tablet   PAD (peripheral artery disease) (HCC)    Upcoming ABIs      Relevant Medications   losartan (COZAAR) 50 MG  tablet   Mild sleep apnea    Not on CPAP      Other Visit Diagnoses     Need for vaccination against Streptococcus pneumoniae       Relevant Orders   Pneumococcal conjugate vaccine 20-valent (Completed)        Meds ordered this encounter  Medications   alendronate (FOSAMAX) 70 MG tablet    Sig: Take 1 tablet (70 mg total) by mouth every 7 (seven) days. Take with a full glass of water on an empty stomach.    Dispense:  12 tablet    Refill:  4   DISCONTD: Dulaglutide (TRULICITY) 0.75 MG/0.5ML SOPN    Sig: Inject 0.75 mg into the skin once a week.    Dispense:  2 mL    Refill:  6   losartan (COZAAR) 50 MG tablet    Sig: Take 1 tablet (50 mg total) by mouth daily.    Dispense:  90 tablet    Refill:  4   metFORMIN (GLUCOPHAGE-XR) 500 MG 24 hr tablet    Sig: Take 2 tablets (1,000 mg total) by mouth daily.    Dispense:  180 tablet    Refill:  4   rOPINIRole (REQUIP) 1 MG tablet    Sig: Take 1 tablet (1 mg total) by mouth at bedtime. Take 2-3 hours before bedtime    Dispense:  90  tablet    Refill:  4   Coenzyme Q10 100 MG capsule    Sig: Take 1 capsule (100 mg total) by mouth 2 (two) times daily.   Dulaglutide (TRULICITY) 1.5 MG/0.5ML SOPN    Sig: Inject 1.5 mg into the skin once a week.    Dispense:  2 mL    Refill:  11    Use this dose    Orders Placed This Encounter  Procedures   Pneumococcal conjugate vaccine 20-valent    Patient Instructions  Prevnar-20 today You may call [facility] at [phone] to schedule an appointment to discuss repeat colonoscopy.  Call to schedule mammogram at your convenience: [facility] in [place] [phone]  Continue fosamax and other medicines.  Try chantix.  Try miralax or colace over the counter.  Continue working on living will, bring [place] a copy when complete.  Return in 3 months for diabetes follow up visit  Follow up plan: Return in about 3 months (around 05/19/2023) for follow up visit.  Eustaquio Boyden, MD

## 2023-02-16 NOTE — Patient Instructions (Addendum)
Prevnar-20 today You may call [facility] at [phone] to schedule an appointment to discuss repeat colonoscopy.  Call to schedule mammogram at your convenience: [facility] in [place] [phone]  Continue fosamax and other medicines.  Try chantix.  Try miralax or colace over the counter.  Continue working on living will, bring [place] a copy when complete.  Return in 3 months for diabetes follow up visit

## 2023-02-19 NOTE — Assessment & Plan Note (Signed)
Chronic, stable on current regimen - continue. 

## 2023-02-19 NOTE — Assessment & Plan Note (Addendum)
Chronic, remains above goal on metformin XR 500mg  bid, she only recently started trulicity 0.75mg  weekly and is tolerating well. No changes at this time, rec RTC 3 mo DM f/u visit.

## 2023-02-19 NOTE — Assessment & Plan Note (Signed)
Discussed changing requip from bedtime to 2-3 hours prior to bedtime.  She notes significant benefit with CoQ10 as well.

## 2023-02-19 NOTE — Assessment & Plan Note (Signed)
Upcoming carotid US

## 2023-02-19 NOTE — Assessment & Plan Note (Signed)
Continue CoQ10 with benefit.

## 2023-02-19 NOTE — Assessment & Plan Note (Signed)
Upcoming ABIs 

## 2023-02-19 NOTE — Assessment & Plan Note (Deleted)
Continue plavix, statin, BB. Sees cardiology.

## 2023-02-19 NOTE — Assessment & Plan Note (Signed)
Stable period off medication.  

## 2023-02-19 NOTE — Assessment & Plan Note (Signed)
Reviewed latest DEXA with worsening T score - fosamax started 03/2022. Will repeat DEXA in 2025

## 2023-02-19 NOTE — Assessment & Plan Note (Signed)
Chronic, stable period on Trelegy, followed by pulmonology.

## 2023-02-19 NOTE — Assessment & Plan Note (Signed)
Continue statin, plavix.  

## 2023-02-19 NOTE — Assessment & Plan Note (Addendum)
Chronic, stable period on crestor + CoQ10 for myalgias The ASCVD Risk score (Arnett DK, et al., 2019) failed to calculate for the following reasons:   The valid total cholesterol range is 130 to 320 mg/dL

## 2023-02-19 NOTE — Assessment & Plan Note (Addendum)
Continued smoker. Continues ytearly ung cancer screening CT. Encouraged she try chantix in an effort to fully quit smoking.

## 2023-02-19 NOTE — Assessment & Plan Note (Signed)
Levels stable on MWF replacement.

## 2023-02-19 NOTE — Assessment & Plan Note (Signed)
Continue plavix, statin, BB. Sees cardiology.

## 2023-02-23 ENCOUNTER — Other Ambulatory Visit: Payer: Self-pay | Admitting: Family Medicine

## 2023-02-23 DIAGNOSIS — I6529 Occlusion and stenosis of unspecified carotid artery: Secondary | ICD-10-CM

## 2023-02-25 ENCOUNTER — Other Ambulatory Visit: Payer: Self-pay | Admitting: *Deleted

## 2023-02-25 DIAGNOSIS — I739 Peripheral vascular disease, unspecified: Secondary | ICD-10-CM

## 2023-02-25 DIAGNOSIS — I10 Essential (primary) hypertension: Secondary | ICD-10-CM

## 2023-02-25 DIAGNOSIS — I77811 Abdominal aortic ectasia: Secondary | ICD-10-CM

## 2023-02-25 DIAGNOSIS — I700 Atherosclerosis of aorta: Secondary | ICD-10-CM

## 2023-02-26 ENCOUNTER — Encounter: Payer: Self-pay | Admitting: Physician Assistant

## 2023-03-01 ENCOUNTER — Encounter: Payer: Self-pay | Admitting: Family Medicine

## 2023-03-01 LAB — HM MAMMOGRAPHY

## 2023-03-03 ENCOUNTER — Ambulatory Visit (INDEPENDENT_AMBULATORY_CARE_PROVIDER_SITE_OTHER): Payer: Managed Care, Other (non HMO)

## 2023-03-03 ENCOUNTER — Ambulatory Visit: Payer: Managed Care, Other (non HMO)

## 2023-03-03 DIAGNOSIS — I77811 Abdominal aortic ectasia: Secondary | ICD-10-CM

## 2023-03-03 DIAGNOSIS — I700 Atherosclerosis of aorta: Secondary | ICD-10-CM

## 2023-03-03 DIAGNOSIS — I739 Peripheral vascular disease, unspecified: Secondary | ICD-10-CM | POA: Diagnosis not present

## 2023-03-03 DIAGNOSIS — I708 Atherosclerosis of other arteries: Secondary | ICD-10-CM | POA: Diagnosis not present

## 2023-03-03 LAB — VAS US ABI WITH/WO TBI
Left ABI: 0.95
Right ABI: 0.91

## 2023-03-16 ENCOUNTER — Other Ambulatory Visit: Payer: Self-pay | Admitting: Cardiovascular Disease

## 2023-03-16 DIAGNOSIS — I2510 Atherosclerotic heart disease of native coronary artery without angina pectoris: Secondary | ICD-10-CM

## 2023-03-16 DIAGNOSIS — I10 Essential (primary) hypertension: Secondary | ICD-10-CM

## 2023-03-23 ENCOUNTER — Other Ambulatory Visit: Payer: Self-pay | Admitting: Family Medicine

## 2023-03-23 DIAGNOSIS — I2510 Atherosclerotic heart disease of native coronary artery without angina pectoris: Secondary | ICD-10-CM

## 2023-03-23 NOTE — Telephone Encounter (Signed)
Plavix Last filled:  12/21/22, #90 Last OV:  02/16/23, CPE Next OV:  05/21/23, 3 mo DM f/u

## 2023-03-24 NOTE — Telephone Encounter (Signed)
ERx 

## 2023-04-08 ENCOUNTER — Ambulatory Visit: Payer: Managed Care, Other (non HMO) | Admitting: Cardiovascular Disease

## 2023-04-23 ENCOUNTER — Ambulatory Visit: Payer: Managed Care, Other (non HMO) | Attending: Family Medicine

## 2023-04-23 DIAGNOSIS — I6521 Occlusion and stenosis of right carotid artery: Secondary | ICD-10-CM

## 2023-04-23 DIAGNOSIS — I6529 Occlusion and stenosis of unspecified carotid artery: Secondary | ICD-10-CM

## 2023-04-29 ENCOUNTER — Other Ambulatory Visit: Payer: Self-pay | Admitting: Physician Assistant

## 2023-04-29 ENCOUNTER — Ambulatory Visit: Payer: Managed Care, Other (non HMO) | Attending: Cardiovascular Disease | Admitting: Cardiovascular Disease

## 2023-04-29 ENCOUNTER — Encounter: Payer: Self-pay | Admitting: Cardiovascular Disease

## 2023-04-29 ENCOUNTER — Encounter (INDEPENDENT_AMBULATORY_CARE_PROVIDER_SITE_OTHER): Payer: Self-pay

## 2023-04-29 VITALS — BP 120/68 | HR 77 | Ht 66.0 in | Wt 184.0 lb

## 2023-04-29 DIAGNOSIS — I739 Peripheral vascular disease, unspecified: Secondary | ICD-10-CM

## 2023-04-29 DIAGNOSIS — I779 Disorder of arteries and arterioles, unspecified: Secondary | ICD-10-CM | POA: Diagnosis not present

## 2023-04-29 DIAGNOSIS — Z720 Tobacco use: Secondary | ICD-10-CM | POA: Diagnosis not present

## 2023-04-29 DIAGNOSIS — I2510 Atherosclerotic heart disease of native coronary artery without angina pectoris: Secondary | ICD-10-CM

## 2023-04-29 DIAGNOSIS — E785 Hyperlipidemia, unspecified: Secondary | ICD-10-CM

## 2023-04-29 NOTE — Progress Notes (Signed)
Cardiology Office Note   Date:  04/29/2023   ID:  Karen Osborne, DOB May 31, 1956, MRN [id number]  PCP:  Eustaquio Boyden, MD  Cardiologist:  Dr. Kirke Corin  Chief Complaint  Patient presents with   Follow-up    Pt feels well today. ABI and [place] review. Meds reviewed.       History of Present Illness: Karen Osborne is a 67 y.o. female who is here today for a follow-up visit regarding peripheral arterial disease.   She has known history of coronary artery disease with previous non-STEMI status post PCI and drug-eluting stent placement to the mid LAD in May of 2013, type 2 diabetes, essential hypertension, hyperlipidemia, COPD and tobacco use. She reports prolonged symptoms of bilateral hip and leg claudication that happens after walking about 30 steps.   Noninvasive studies showed mildly reduced ABI bilaterally with no evidence of infrainguinal disease.  She was noted to have significant stenosis in bilateral common iliac arteries.  She had repeat Doppler studies done in June and were overall stable. Recent carotid Doppler showed mild nonobstructive disease. She reports stable mild bilateral leg claudication.  She is able to walk 100 yards without having to stop.  She tried to quit smoking with Chantix but was not successful.  She takes her medications regularly.  No chest pain or worsening dyspnea.   Past Medical History:  Diagnosis Date   BRCA negative 08/2015   MyRisk neg   Chronic diastolic CHF (congestive heart failure) (HCC)    COPD (chronic obstructive pulmonary disease) with emphysema (HCC) 2019   Coronary artery disease    a. s/p NSTEMI - LHC 01/27/12: prox LAD 20%, mid LAD 99%, prox OM 30%, prox RCA 30%, mid RCA 50%, dist RCA 30%, EF 60%.  PCI:  2.25 x 16 mm Promus Element DES to mid LAD //  b. Myoview (7/15):  Fixed apical defect (breast attenuation versus scar), no ischemia, EF 64%; low risk  // Myoview 7/22: EF 69, no ischemia or infarction; low risk   COVID-19 virus  infection 07/2021   Rx molnupiravir -> rash so stopped early   Depression    Diabetes mellitus without complication (HCC) 2019   Family history of ovarian cancer    Glucose intolerance (impaired glucose tolerance)    A1C 6.1 01/2012   History of echocardiogram    Echo (01/28/12):  Mild LVH, EF 55%, apical septum and true apex severe HK, Gr 2 DD, mild LAE, normal RVF // Echo 7/18: mild LVH, EF 60-65, no RWMA, Gr 2 DD, normal RVF   History of nuclear stress test    Nuclear stress test 4/19: EF 74, apicoseptal defect c/w atten artifact, normal perfusion; Low Risk   Hypercholesteremia    Hypertension    Myocardial infarction (HCC)    NSVD (normal spontaneous vaginal delivery) 1988   Osteopenia 2016   hip   Osteoporosis 2019   in spine and femoral neck; at [facility]   Restless leg syndrome    Tobacco abuse     Past Surgical History:  Procedure Laterality Date   ABDOMINAL HYSTERECTOMY     TAHBSO   COLONOSCOPY  10/2006   TA, HP (Dr Mechele Collin)   COLONOSCOPY WITH PROPOFOL N/A 08/19/2018   TAs, rpt 3 yrs (Tahiliani, [place] B, MD)   CORONARY ANGIOPLASTY WITH STENT PLACEMENT  01/27/2012   DES to LAD   gyn surgery  02/1998   total hysterectomy, abn pap smear, no cancer   LEFT HEART CATHETERIZATION WITH  CORONARY ANGIOGRAM N/A 01/27/2012   Procedure: LEFT HEART CATHETERIZATION WITH CORONARY ANGIOGRAM;  Surgeon: Kathleene Hazel, MD;  Location: [facility] CATH LAB;  Service: Cardiovascular;  Laterality: N/A;   PERCUTANEOUS CORONARY STENT INTERVENTION (PCI-S)  01/27/2012   Procedure: PERCUTANEOUS CORONARY STENT INTERVENTION (PCI-S);  Surgeon: Kathleene Hazel, MD;  Location: [facility] CATH LAB;  Service: Cardiovascular;;     Current Outpatient Medications  Medication Sig Dispense Refill   albuterol (VENTOLIN HFA) 108 (90 Base) MCG/ACT inhaler TAKE 2 PUFFS BY MOUTH EVERY 6 HOURS AS NEEDED FOR WHEEZE OR SHORTNESS OF BREATH 8.5 each 6   alendronate (FOSAMAX) 70 MG tablet Take 1 tablet (70 mg total) by mouth  every 7 (seven) days. Take with a full glass of water on an empty stomach. 12 tablet 4   aspirin EC 81 MG tablet Take 1 tablet (81 mg total) by mouth daily. Swallow whole.     Blood Glucose Monitoring Suppl (ONE TOUCH ULTRA 2) w/Device KIT 1 each by Does not apply route daily. Use as directed to check sugar once a day 1 each 0   carvedilol (COREG) 6.25 MG tablet TAKE 1 TABLET BY MOUTH TWICE A DAY WITH FOOD 180 tablet 0   Cholecalciferol (VITAMIN D) 125 MCG (5000 UT) CAPS Take 1 capsule by mouth every Monday, Wednesday, and Friday.     clopidogrel (PLAVIX) 75 MG tablet TAKE 1 TABLET BY MOUTH EVERY DAY 90 tablet 1   Coenzyme Q10 100 MG capsule Take 1 capsule (100 mg total) by mouth 2 (two) times daily.     Cyanocobalamin (B-12) 1000 MCG SUBL Place 1 tablet under the tongue every Monday, Wednesday, and Friday.     Dulaglutide (TRULICITY) 1.5 MG/0.5ML SOPN Inject 1.5 mg into the skin once a week. 2 mL 11   Fluticasone-Umeclidin-Vilant (TRELEGY ELLIPTA) 100-62.5-25 MCG/ACT AEPB INHALE 1 PUFF BY MOUTH EVERY DAY 60 each 5   glucose blood (ONE TOUCH ULTRA TEST) test strip 1 each by Other route daily. Use as instructed to check sugar once a day. 100 each 0   hydrochlorothiazide (MICROZIDE) 12.5 MG capsule Take 1 capsule (12.5 mg total) by mouth daily. 90 capsule 2   losartan (COZAAR) 50 MG tablet Take 1 tablet (50 mg total) by mouth daily. 90 tablet 4   metFORMIN (GLUCOPHAGE-XR) 500 MG 24 hr tablet Take 2 tablets (1,000 mg total) by mouth daily. 180 tablet 4   nicotine (NICODERM CQ - DOSED IN MG/24 HOURS) 14 mg/24hr patch Place 1 patch (14 mg total) onto the skin daily. 28 patch 0   nitroGLYCERIN (NITROSTAT) 0.4 MG SL tablet Place 1 tablet (0.4 mg total) under the tongue every 5 (five) minutes as needed for chest pain (up to 3 doses). 25 tablet 3   ONETOUCH DELICA LANCETS 33G MISC 1 each by Does not apply route daily. Use as directed to check sugar once a day. 100 each 0   rOPINIRole (REQUIP) 1 MG tablet  Take 1 tablet (1 mg total) by mouth at bedtime. Take 2-3 hours before bedtime 90 tablet 4   rosuvastatin (CRESTOR) 40 MG tablet Take 1 tablet (40 mg total) by mouth daily. 90 tablet 1   TRULICITY 0.75 MG/0.5ML SOPN Inject 0.75 mg into the skin once a week.     Varenicline Tartrate, Starter, (CHANTIX STARTING MONTH PAK) 0.5 MG X 11 & 1 MG X 42 TBPK Take one 0.5 mg tablet by mouth once daily for 3 days, then increase to one 0.5 mg tablet twice daily  for 4 days, then increase to one 1 mg tablet twice daily. (Patient not taking: Reported on 04/29/2023) 60 each 0   No current facility-administered medications for this visit.    Allergies:   Penicillins    Social History:  The patient  reports that she has been smoking cigarettes. She started smoking about 19 months ago. She has a 97.6 pack-year smoking history. She has never used smokeless tobacco. She reports that she does not drink alcohol and does not use drugs.   Family History:  The patient's family history includes Diabetes in her sister; Heart attack in her father; Heart disease (age of onset: 34) in her father; Hypertension in her father; Kidney cancer in her father; Ovarian cancer (age of onset: 78) in her maternal aunt; Prostate cancer in her father.    ROS:  Please see the history of present illness.   Otherwise, review of systems are positive for none.   All other systems are reviewed and negative.    PHYSICAL EXAM: VS:  BP 120/68 (BP Location: Left Arm, Patient Position: Sitting, Cuff Size: Normal)   Pulse 77   Ht 5\' 6"  (1.676 m)   Wt 184 lb (83.5 kg)   SpO2 93%   BMI 29.70 kg/m  , BMI Body mass index is 29.7 kg/m. GEN: Well nourished, well developed, in no acute distress  HEENT: normal  Neck: no JVD,  or masses.  Bilateral carotid bruits Cardiac: RRR; no rubs, or gallops,no edema .  2 / 6 systolic murmur in the aortic area Respiratory:  clear to auscultation bilaterally, normal work of breathing GI: soft, nontender,  nondistended, + BS MS: no deformity or atrophy  Skin: warm and dry, no rash Neuro:  Strength and sensation are intact Psych: euthymic mood, full affect Vascular: Femoral pulses +1 bilaterally.  Distal pedal pulses are very faint.   EKG:  EKG is ordered today. EKG showed: Normal sinus rhythm Cannot rule out Inferior infarct , age undetermined Cannot rule out Anterior infarct , age undetermined When compared with ECG of 29-Jan-2012 06:06, Minimal criteria for Inferior infarct are now Present T wave inversion no longer evident in Lateral leads    Recent Labs: 02/11/2023: ALT 13; BUN 13; Creatinine, Ser 0.97; Potassium 4.0; Sodium 138; TSH 2.30    Lipid Panel    Component Value Date/Time   CHOL 109 02/11/2023 0721   CHOL 123 06/25/2017 0734   TRIG 199.0 (H) 02/11/2023 0721   HDL 28.30 (L) 02/11/2023 0721   HDL 28 (L) 06/25/2017 0734   CHOLHDL 4 02/11/2023 0721   VLDL 39.8 02/11/2023 0721   LDLCALC 40 02/11/2023 0721   LDLCALC 64 06/25/2017 0734      Wt Readings from Last 3 Encounters:  04/29/23 184 lb (83.5 kg)  02/16/23 183 lb 6 oz (83.2 kg)  11/13/22 192 lb (87.1 kg)           No data to display            ASSESSMENT AND PLAN:  1.  Peripheral arterial disease: Bilateral hip and leg claudication due to common iliac artery disease bilaterally.  Her symptoms improved and currently not lifestyle limiting.  Thus, recommend continuing medical therapy.    2.  Bilateral carotid disease with bilateral bruits: Most send carotid Doppler showed mild nonobstructive disease.  3.  Coronary artery disease involving native coronary arteries without angina: She is stable overall.  She is on long-term dual antiplatelet therapy.  4.  Tobacco use: I again  discussed with her the importance of smoking cessation.  She was not able to quit with Chantix.  5.  Hyperlipidemia: Currently on rosuvastatin 40 mg once daily.  Most recent lipid profile showed an LDL of 40 which is at  target.    Disposition:   FU with me in 12 months  Signed,  Lorine Bears, MD  04/29/2023 1:35 PM    [facility]

## 2023-04-29 NOTE — Patient Instructions (Signed)
Medication Instructions:  No changes *If you need a refill on your cardiac medications before your next appointment, please call your pharmacy*   Lab Work: None ordered If you have labs (blood work) drawn today and your tests are completely normal, you will receive your results only by: MyChart Message (if you have MyChart) OR A paper copy in the mail If you have any lab test that is abnormal or we need to change your treatment, we will call you to review the results.   Testing/Procedures: None ordered   Follow-Up: At [facility], you and your health needs are our priority.  As part of our continuing mission to provide you with exceptional heart care, we have created designated Provider Care Teams.  These Care Teams include your primary Cardiologist (physician) and Advanced Practice Providers (APPs -  Physician Assistants and Nurse Practitioners) who all work together to provide you with the care you need, when you need it.  We recommend signing up for the patient portal called "MyChart".  Sign up information is provided on this After Visit Summary.  MyChart is used to connect with patients for Virtual Visits (Telemedicine).  Patients are able to view lab/test results, encounter notes, upcoming appointments, etc.  Non-urgent messages can be sent to your provider as well.   To learn more about what you can do with MyChart, go to [email or web address].    Your next appointment:   12 month(s)  Provider:   You may see Dr. Kirke Corin or one of the following Advanced Practice Providers on your designated Care Team:   Nicolasa Ducking, NP Eula Listen, PA-C Cadence Fransico Michael, PA-C Charlsie Quest, NP    Other Instructions Managing the Challenge of Quitting Smoking Quitting smoking is a physical and mental challenge. You may have cravings, withdrawal symptoms, and temptation to smoke. Before quitting, work with your health care provider to make a plan that can help you manage quitting.  Making a plan before you quit may keep you from smoking when you have the urge to smoke while trying to quit. How to manage lifestyle changes Managing stress Stress can make you want to smoke, and wanting to smoke may cause stress. It is important to find ways to manage your stress. You could try some of the following: Practice relaxation techniques. Breathe slowly and deeply, in through your nose and out through your mouth. Listen to music. Soak in a bath or take a shower. Imagine a peaceful place or vacation. Get some support. Talk with family or friends about your stress. Join a support group. Talk with a counselor or therapist. Get some physical activity. Go for a walk, run, or bike ride. Play a favorite sport. Practice yoga.  Medicines Talk with your health care provider about medicines that might help you deal with cravings and make quitting easier for you. Relationships Social situations can be difficult when you are quitting smoking. To manage this, you can: Avoid parties and other social situations where people might be smoking. Avoid alcohol. Leave right away if you have the urge to smoke. Explain to your family and friends that you are quitting smoking. Ask for support and let them know you might be a bit grumpy. Plan activities where smoking is not an option. General instructions Be aware that many people gain weight after they quit smoking. However, not everyone does. To keep from gaining weight, have a plan in place before you quit, and stick to the plan after you quit. Your plan should include: Eating  healthy snacks. When you have a craving, it may help to: Eat popcorn, or try carrots, celery, or other cut vegetables. Chew sugar-free gum. Changing how you eat. Eat small portion sizes at meals. Eat 4-6 small meals throughout the day instead of 1-2 large meals a day. Be mindful when you eat. You should avoid watching television or doing other things that might distract  you as you eat. Exercising regularly. Make time to exercise each day. If you do not have time for a long workout, do short bouts of exercise for 5-10 minutes several times a day. Do some form of strengthening exercise, such as weight lifting. Do some exercise that gets your heart beating and causes you to breathe deeply, such as walking fast, running, swimming, or biking. This is very important. Drinking plenty of water or other low-calorie or no-calorie drinks. Drink enough fluid to keep your urine pale yellow.  How to recognize withdrawal symptoms Your body and mind may experience discomfort as you try to get used to not having nicotine in your system. These effects are called withdrawal symptoms. They may include: Feeling hungrier than normal. Having trouble concentrating. Feeling irritable or restless. Having trouble sleeping. Feeling depressed. Craving a cigarette. These symptoms may surprise you, but they are normal to have when quitting smoking. To manage withdrawal symptoms: Avoid places, people, and activities that trigger your cravings. Remember why you want to quit. Get plenty of sleep. Avoid coffee and other drinks that contain caffeine. These may worsen some of your symptoms. How to manage cravings Come up with a plan for how to deal with your cravings. The plan should include the following: A definition of the specific situation you want to deal with. An activity or action you will take to replace smoking. A clear idea for how this action will help. The name of someone who could help you with this. Cravings usually last for 5-10 minutes. Consider taking the following actions to help you with your plan to deal with cravings: Keep your mouth busy. Chew sugar-free gum. Suck on hard candies or a straw. Brush your teeth. Keep your hands and body busy. Change to a different activity right away. Squeeze or play with a ball. Do an activity or a hobby, such as making bead  jewelry, practicing needlepoint, or working with wood. Mix up your normal routine. Take a short exercise break. Go for a quick walk, or run up and down stairs. Focus on doing something kind or helpful for someone else. Call a friend or family member to talk during a craving. Join a support group. Contact a quitline. Where to find support To get help or find a support group: Call the National Cancer Institute's Smoking Quitline: 1-800-QUIT-NOW 713 805 6598) Text QUIT to SmokefreeTXT: 347425 Where to find more information Visit these websites to find more information on quitting smoking: U.S. Department of Health and Human Services: www.smokefree.gov American Lung Association: www.freedomfromsmoking.org Centers for Disease Control and Prevention (CDC): FootballExhibition.com.br American Heart Association: www.heart.org Contact a health care provider if: You want to change your plan for quitting. The medicines you are taking are not helping. Your eating feels out of control or you cannot sleep. You feel depressed or become very anxious. Summary Quitting smoking is a physical and mental challenge. You will face cravings, withdrawal symptoms, and temptation to smoke again. Preparation can help you as you go through these challenges. Try different techniques to manage stress, handle social situations, and prevent weight gain. You can deal with cravings by  keeping your mouth busy (such as by chewing gum), keeping your hands and body busy, calling family or friends, or contacting a quitline for people who want to quit smoking. You can deal with withdrawal symptoms by avoiding places where people smoke, getting plenty of rest, and avoiding drinks that contain caffeine. This information is not intended to replace advice given to you by your health care provider. Make sure you discuss any questions you have with your health care provider. Document Revised: 08/22/2021 Document Reviewed: 08/22/2021 Elsevier Patient  Education  2024 [employer].

## 2023-05-18 ENCOUNTER — Other Ambulatory Visit: Payer: Self-pay | Admitting: Acute Care

## 2023-05-18 DIAGNOSIS — F17210 Nicotine dependence, cigarettes, uncomplicated: Secondary | ICD-10-CM

## 2023-05-18 DIAGNOSIS — Z122 Encounter for screening for malignant neoplasm of respiratory organs: Secondary | ICD-10-CM

## 2023-05-18 DIAGNOSIS — Z87891 Personal history of nicotine dependence: Secondary | ICD-10-CM

## 2023-05-21 ENCOUNTER — Ambulatory Visit (INDEPENDENT_AMBULATORY_CARE_PROVIDER_SITE_OTHER): Payer: Managed Care, Other (non HMO) | Admitting: Family Medicine

## 2023-05-21 VITALS — BP 160/60 | HR 76 | Temp 97.9°F | Ht 66.0 in | Wt 184.2 lb

## 2023-05-21 DIAGNOSIS — I708 Atherosclerosis of other arteries: Secondary | ICD-10-CM

## 2023-05-21 DIAGNOSIS — K635 Polyp of colon: Secondary | ICD-10-CM | POA: Diagnosis not present

## 2023-05-21 DIAGNOSIS — G2581 Restless legs syndrome: Secondary | ICD-10-CM

## 2023-05-21 DIAGNOSIS — I739 Peripheral vascular disease, unspecified: Secondary | ICD-10-CM

## 2023-05-21 DIAGNOSIS — Z23 Encounter for immunization: Secondary | ICD-10-CM

## 2023-05-21 DIAGNOSIS — F17200 Nicotine dependence, unspecified, uncomplicated: Secondary | ICD-10-CM | POA: Diagnosis not present

## 2023-05-21 DIAGNOSIS — E1169 Type 2 diabetes mellitus with other specified complication: Secondary | ICD-10-CM | POA: Diagnosis not present

## 2023-05-21 DIAGNOSIS — I10 Essential (primary) hypertension: Secondary | ICD-10-CM

## 2023-05-21 DIAGNOSIS — Z7984 Long term (current) use of oral hypoglycemic drugs: Secondary | ICD-10-CM

## 2023-05-21 DIAGNOSIS — I700 Atherosclerosis of aorta: Secondary | ICD-10-CM

## 2023-05-21 DIAGNOSIS — Z7985 Long-term (current) use of injectable non-insulin antidiabetic drugs: Secondary | ICD-10-CM

## 2023-05-21 LAB — POCT GLYCOSYLATED HEMOGLOBIN (HGB A1C): Hemoglobin A1C: 6.4 % — AB (ref 4.0–5.6)

## 2023-05-21 MED ORDER — VARENICLINE TARTRATE (STARTER) 0.5 MG X 11 & 1 MG X 42 PO TBPK: ORAL_TABLET | ORAL | 0 refills | Status: AC

## 2023-05-21 NOTE — Assessment & Plan Note (Signed)
Overdue for colonoscopy - # provided to call [facility] for appt

## 2023-05-21 NOTE — Assessment & Plan Note (Signed)
Appreciate cardiology care. She will notify them if worsening claudication symptoms.

## 2023-05-21 NOTE — Patient Instructions (Addendum)
Flu shot today  You may call [facility] at [phone] to schedule an appointment for colonoscopy.  BP was too high today - keep log with sheet provided today, drop off after 1-2 weeks. If consistently high, we will increase medicine.  Sugar control was great! Continue current medicines.

## 2023-05-21 NOTE — Progress Notes (Signed)
Ph: [phone] Fax: [phone]   Patient ID: Karen Osborne, female    DOB: [birth year], 67 y.o.   MRN: [id number]  This visit was conducted in person.  BP (!) 160/60 (BP Location: Right Arm, Cuff Size: Large)   Pulse 76   Temp 97.9 F (36.6 C) (Temporal)   Ht 5\' 6"  (1.676 m)   Wt 184 lb 4 oz (83.6 kg)   SpO2 97%   BMI 29.74 kg/m   BP Readings from Last 3 Encounters:  05/21/23 (!) 160/60  04/29/23 120/68  02/16/23 128/82   Pulse Readings from Last 3 Encounters:  05/21/23 76  04/29/23 77  02/16/23 78   CC: 3 mo DM f/u visit  Subjective:   HPI: Karen Osborne is a 67 y.o. female presenting on 05/21/2023 for Medical Management of Chronic Issues (Here for 3 mo DM f/u.)   Overdue for colonoscopy.  Continued smoker 1/2 ppd. Has still not tried chantix. Requests refill.   Known CAD s/p NSTEMI 2013 with DES to LAD. Myoview 2019 low risk. Continues plavix, carvedilol, rosuvastatin. Saw Dr Kirke Corin for PAD found to have B common iliac artery causing bilateral hip/leg claudication. Also found to have bilat carotid stenosis (mild).   RLS - continues Coq10 and Requip for RLS 1mg . Describes sharp stabbing pain to kneecaps at night. No trouble during the day.   HTN - Compliant with current antihypertensive regimen of carvedilol 6.25mg  bid, hydrochlorothiazide 12.5mg  daily, losartan 50mg  daily. Does not check blood pressures at home. No low blood pressure readings or symptoms of syncope. Denies HA, vision changes, CP/tightness, SOB, leg swelling. Some missed BP med doses in the past week - didn't take last night. She took AM meds today.   DM - does regularly check sugars - fasting 135 this morning. Compliant with antihyperglycemic regimen which includes: trulicity 1.5mg  weekly (tolerating well), metformin XR 500mg  BID. Denies low sugars or hypoglycemic symptoms. Denies paresthesias, blurry vision. Last diabetic eye exam 10/2022. Glucometer brand: one touch ultra 2. Last foot exam: 07/2022.  DSME: started at [facility] 2019. Lab Results  Component Value Date   HGBA1C 6.4 (A) 05/21/2023   Diabetic Foot Exam - Simple   Simple Foot Form Diabetic Foot exam was performed with the following findings: Yes 05/21/2023  9:21 AM  Visual Inspection No deformities, no ulcerations, no other skin breakdown bilaterally: Yes Sensation Testing Intact to touch and monofilament testing bilaterally: Yes Pulse Check See comments: Yes Comments + claudication to hips with walking (known PAD) Diminished pulses BLE    Lab Results  Component Value Date   MICROALBUR 0.9 02/11/2023         Relevant past medical, surgical, family and social history reviewed and updated as indicated. Interim medical history since our last visit reviewed. Allergies and medications reviewed and updated. Outpatient Medications Prior to Visit  Medication Sig Dispense Refill   albuterol (VENTOLIN HFA) 108 (90 Base) MCG/ACT inhaler TAKE 2 PUFFS BY MOUTH EVERY 6 HOURS AS NEEDED FOR WHEEZE OR SHORTNESS OF BREATH 8.5 each 6   alendronate (FOSAMAX) 70 MG tablet Take 1 tablet (70 mg total) by mouth every 7 (seven) days. Take with a full glass of water on an empty stomach. 12 tablet 4   aspirin EC 81 MG tablet Take 1 tablet (81 mg total) by mouth daily. Swallow whole.     Blood Glucose Monitoring Suppl (ONE TOUCH ULTRA 2) w/Device KIT 1 each by Does not apply route daily. Use as directed to check  sugar once a day 1 each 0   carvedilol (COREG) 6.25 MG tablet TAKE 1 TABLET BY MOUTH TWICE A DAY WITH FOOD 180 tablet 0   Cholecalciferol (VITAMIN D) 125 MCG (5000 UT) CAPS Take 1 capsule by mouth every Monday, Wednesday, and Friday.     clopidogrel (PLAVIX) 75 MG tablet TAKE 1 TABLET BY MOUTH EVERY DAY 90 tablet 1   Coenzyme Q10 100 MG capsule Take 1 capsule (100 mg total) by mouth 2 (two) times daily.     Cyanocobalamin (B-12) 1000 MCG SUBL Place 1 tablet under the tongue every Monday, Wednesday, and Friday.     Dulaglutide (TRULICITY)  1.5 MG/0.5ML SOPN Inject 1.5 mg into the skin once a week. 2 mL 11   Fluticasone-Umeclidin-Vilant (TRELEGY ELLIPTA) 100-62.5-25 MCG/ACT AEPB INHALE 1 PUFF BY MOUTH EVERY DAY 60 each 5   glucose blood (ONE TOUCH ULTRA TEST) test strip 1 each by Other route daily. Use as instructed to check sugar once a day. 100 each 0   hydrochlorothiazide (MICROZIDE) 12.5 MG capsule Take 1 capsule (12.5 mg total) by mouth daily. 90 capsule 2   losartan (COZAAR) 50 MG tablet Take 1 tablet (50 mg total) by mouth daily. 90 tablet 4   metFORMIN (GLUCOPHAGE-XR) 500 MG 24 hr tablet Take 2 tablets (1,000 mg total) by mouth daily. 180 tablet 4   nicotine (NICODERM CQ - DOSED IN MG/24 HOURS) 14 mg/24hr patch Place 1 patch (14 mg total) onto the skin daily. 28 patch 0   nitroGLYCERIN (NITROSTAT) 0.4 MG SL tablet Place 1 tablet (0.4 mg total) under the tongue every 5 (five) minutes as needed for chest pain (up to 3 doses). 25 tablet 3   ONETOUCH DELICA LANCETS 33G MISC 1 each by Does not apply route daily. Use as directed to check sugar once a day. 100 each 0   rOPINIRole (REQUIP) 1 MG tablet Take 1 tablet (1 mg total) by mouth at bedtime. Take 2-3 hours before bedtime 90 tablet 4   rosuvastatin (CRESTOR) 40 MG tablet Take 1 tablet (40 mg total) by mouth daily. 90 tablet 1   TRULICITY 0.75 MG/0.5ML SOPN Inject 0.75 mg into the skin once a week.     Varenicline Tartrate, Starter, (CHANTIX STARTING MONTH PAK) 0.5 MG X 11 & 1 MG X 42 TBPK Take one 0.5 mg tablet by mouth once daily for 3 days, then increase to one 0.5 mg tablet twice daily for 4 days, then increase to one 1 mg tablet twice daily. 60 each 0   No facility-administered medications prior to visit.     Per HPI unless specifically indicated in ROS section below Review of Systems  Objective:  BP (!) 160/60 (BP Location: Right Arm, Cuff Size: Large)   Pulse 76   Temp 97.9 F (36.6 C) (Temporal)   Ht 5\' 6"  (1.676 m)   Wt 184 lb 4 oz (83.6 kg)   SpO2 97%   BMI  29.74 kg/m   Wt Readings from Last 3 Encounters:  05/21/23 184 lb 4 oz (83.6 kg)  04/29/23 184 lb (83.5 kg)  02/16/23 183 lb 6 oz (83.2 kg)      Physical Exam Vitals and nursing note reviewed.  Constitutional:      Appearance: Normal appearance. She is not ill-appearing.  Eyes:     Extraocular Movements: Extraocular movements intact.     Conjunctiva/sclera: Conjunctivae normal.     Pupils: Pupils are equal, round, and reactive to light.  Cardiovascular:  Rate and Rhythm: Normal rate and regular rhythm.     Pulses: Normal pulses.     Heart sounds: Normal heart sounds. No murmur heard. Pulmonary:     Effort: Pulmonary effort is normal. No respiratory distress.     Breath sounds: Normal breath sounds. No wheezing, rhonchi or rales.  Musculoskeletal:     Right lower leg: No edema.     Left lower leg: No edema.  Skin:    General: Skin is warm and dry.     Findings: No rash.     Comments:  See HPI for foot exam if done  Neurological:     Mental Status: She is alert.  Psychiatric:        Mood and Affect: Mood normal.        Behavior: Behavior normal.       Results for orders placed or performed in visit on 05/21/23  POCT glycosylated hemoglobin (Hb A1C)  Result Value Ref Range   Hemoglobin A1C 6.4 (A) 4.0 - 5.6 %   HbA1c POC (<> result, manual entry)     HbA1c, POC (prediabetic range)     HbA1c, POC (controlled diabetic range)     Lab Results  Component Value Date   IRON 71 01/24/2021   FERRITIN 50.7 01/24/2021   Assessment & Plan:   Problem List Items Addressed This Visit     TOBACCO ABUSE    Continue to encourage cessation. 1/2 ppd smoker.  Chantix refilled.       RESTLESS LEG SYNDROME    Managed with requip 1mg  nightly.  Describes intermittent sharp stabbing patellar pain at night - not consistent with RLS.  Consider updated iron panel next labwork.       Essential hypertension    Chronic, deteriorated in setting of few missed doses this week,  previously well controlled.  Rec regularly taking BP meds. BP log provided and I asked her to drop off readings in 1-2 wks to review.  If persistently elevated, consider titrating antihypertensive (likely BB).       Type 2 diabetes mellitus with other specified complication (HCC) - Primary    Chronic, improvement since starting trulicity 1.5mg  weekly - continue this with metformin.       Relevant Orders   POCT glycosylated hemoglobin (Hb A1C) (Completed)   Polyp of sigmoid colon    Overdue for colonoscopy - # provided to call [facility] for appt       Aorto-iliac atherosclerosis (HCC)   PAD (peripheral artery disease) (HCC)    Appreciate cardiology care. She will notify them if worsening claudication symptoms.       Other Visit Diagnoses     Encounter for immunization       Relevant Orders   Flu Vaccine Trivalent High Dose (Fluad) (Completed)        Meds ordered this encounter  Medications   Varenicline Tartrate, Starter, (CHANTIX STARTING MONTH PAK) 0.5 MG X 11 & 1 MG X 42 TBPK    Sig: Take one 0.5 mg tablet by mouth once daily for 3 days, then increase to one 0.5 mg tablet twice daily for 4 days, then increase to one 1 mg tablet twice daily.    Dispense:  60 each    Refill:  0    Orders Placed This Encounter  Procedures   Flu Vaccine Trivalent High Dose (Fluad)   POCT glycosylated hemoglobin (Hb A1C)    Patient Instructions  Flu shot today  You may call [facility] at [phone] to schedule an appointment for colonoscopy.  BP was too high today - keep log with sheet provided today, drop off after 1-2 weeks. If consistently high, we will increase medicine.  Sugar control was great! Continue current medicines.   Follow up plan: Return in about 3 months (around 08/20/2023) for follow up visit.  Eustaquio Boyden, MD

## 2023-05-21 NOTE — Assessment & Plan Note (Signed)
Managed with requip 1mg  nightly.  Describes intermittent sharp stabbing patellar pain at night - not consistent with RLS.  Consider updated iron panel next labwork.

## 2023-05-21 NOTE — Assessment & Plan Note (Signed)
Chronic, deteriorated in setting of few missed doses this week, previously well controlled.  Rec regularly taking BP meds. BP log provided and I asked her to drop off readings in 1-2 wks to review.  If persistently elevated, consider titrating antihypertensive (likely BB).

## 2023-05-21 NOTE — Assessment & Plan Note (Signed)
Chronic, improvement since starting trulicity 1.5mg  weekly - continue this with metformin.

## 2023-05-21 NOTE — Assessment & Plan Note (Signed)
Continue to encourage cessation. 1/2 ppd smoker.  Chantix refilled.

## 2023-06-16 ENCOUNTER — Other Ambulatory Visit: Payer: Self-pay | Admitting: Cardiovascular Disease

## 2023-06-17 ENCOUNTER — Other Ambulatory Visit: Payer: Self-pay | Admitting: Cardiovascular Disease

## 2023-06-17 ENCOUNTER — Other Ambulatory Visit: Payer: Self-pay | Admitting: Family Medicine

## 2023-06-17 DIAGNOSIS — G2581 Restless legs syndrome: Secondary | ICD-10-CM

## 2023-06-17 DIAGNOSIS — I2510 Atherosclerotic heart disease of native coronary artery without angina pectoris: Secondary | ICD-10-CM

## 2023-06-17 DIAGNOSIS — I10 Essential (primary) hypertension: Secondary | ICD-10-CM

## 2023-06-23 NOTE — Telephone Encounter (Signed)
Med list says take one 1 at bedtime with #90 tabs and 4 refills given on 02/16/23  This refill is saying 1-2 tabs at bedtime and requesting #180 tabs, please review

## 2023-06-24 NOTE — Telephone Encounter (Signed)
ERx sig to take one 1mg  tab nightly

## 2023-07-31 ENCOUNTER — Other Ambulatory Visit: Payer: Self-pay | Admitting: Cardiovascular Disease

## 2023-07-31 DIAGNOSIS — E7800 Pure hypercholesterolemia, unspecified: Secondary | ICD-10-CM

## 2023-08-02 ENCOUNTER — Ambulatory Visit
Admission: RE | Admit: 2023-08-02 | Discharge: 2023-08-02 | Disposition: A | Discharge status: Home / Self Care | Payer: Managed Care, Other (non HMO) | Source: Ambulatory Visit | Attending: Family Medicine | Admitting: Family Medicine

## 2023-08-02 DIAGNOSIS — Z87891 Personal history of nicotine dependence: Secondary | ICD-10-CM

## 2023-08-02 DIAGNOSIS — F17210 Nicotine dependence, cigarettes, uncomplicated: Secondary | ICD-10-CM

## 2023-08-02 DIAGNOSIS — Z122 Encounter for screening for malignant neoplasm of respiratory organs: Secondary | ICD-10-CM

## 2023-08-20 ENCOUNTER — Ambulatory Visit (INDEPENDENT_AMBULATORY_CARE_PROVIDER_SITE_OTHER): Payer: Managed Care, Other (non HMO) | Admitting: Family Medicine

## 2023-08-20 ENCOUNTER — Other Ambulatory Visit: Payer: Self-pay | Admitting: Acute Care

## 2023-08-20 VITALS — BP 126/72 | HR 72 | Temp 97.7°F | Ht 66.0 in | Wt 184.4 lb

## 2023-08-20 DIAGNOSIS — E1169 Type 2 diabetes mellitus with other specified complication: Secondary | ICD-10-CM | POA: Diagnosis not present

## 2023-08-20 DIAGNOSIS — Z122 Encounter for screening for malignant neoplasm of respiratory organs: Secondary | ICD-10-CM

## 2023-08-20 DIAGNOSIS — Z87891 Personal history of nicotine dependence: Secondary | ICD-10-CM

## 2023-08-20 DIAGNOSIS — F17210 Nicotine dependence, cigarettes, uncomplicated: Secondary | ICD-10-CM

## 2023-08-20 DIAGNOSIS — I10 Essential (primary) hypertension: Secondary | ICD-10-CM

## 2023-08-20 DIAGNOSIS — F17200 Nicotine dependence, unspecified, uncomplicated: Secondary | ICD-10-CM | POA: Diagnosis not present

## 2023-08-20 DIAGNOSIS — F418 Other specified anxiety disorders: Secondary | ICD-10-CM

## 2023-08-20 LAB — POCT GLYCOSYLATED HEMOGLOBIN (HGB A1C): Hemoglobin A1C: 7 % — AB (ref 4.0–5.6)

## 2023-08-20 MED ORDER — DULOXETINE HCL 20 MG PO CPEP: 20.0000 mg | ORAL_CAPSULE | Freq: Every day | ORAL | 3 refills | Status: DC

## 2023-08-20 NOTE — Assessment & Plan Note (Signed)
Chronic, stable on current regimen - continue. 

## 2023-08-20 NOTE — Patient Instructions (Addendum)
Let us know if lung cancer screening CT isn't read in the next 1-2 weeks.  Call to schedule colonoscopy.  Continue working on smoking cessation.  Start cymbalta 20mg  daily in the morning.  Return in 3 months for follow up mood

## 2023-08-20 NOTE — Assessment & Plan Note (Signed)
Chronic, well controlled on current regimen - conitnue.

## 2023-08-20 NOTE — Progress Notes (Signed)
Ph: [phone] Fax: [phone]   Patient ID: Karen Osborne, female    DOB: 08/14/1956, 67 y.o.   MRN: [id number]  This visit was conducted in person.  BP 126/72   Pulse 72   Temp 97.7 F (36.5 C) (Oral)   Ht 5\' 6"  (1.676 m)   Wt 184 lb 6 oz (83.6 kg)   SpO2 94%   BMI 29.76 kg/m    CC: DM f/u visit  Subjective:   HPI: Karen Osborne is a 67 y.o. female presenting on 08/20/2023 for Medical Management of Chronic Issues (Here for 3 mo DM/HTN f/u.)   Notes worsening mood difficulty - depression/anxiety over the past 2+ months. Feels mood medicines haven't been consistently helpful. She has tried wellbutrin, celexa with limited benefit. Anhedonia, low energy, increased appetite, trouble with sleep.   Overdue for colonoscopy - # previously provided Smoking - 1/2 ppd hasn't tried chantix.   HTN - Compliant with current antihypertensive regimen of carvedilol 6.25mg  BID, hctz 12.5mg  daily, losartan 50mg  daily. Does check blood pressures at home: well controlled. Occ dizziness. No syncope. Denies HA, vision changes, CP/tightness, SOB, leg swelling.    DM - does regularly check sugars a few times a week - 99-180s. Compliant with antihyperglycemic regimen which includes: Trulicity 1.5mg  weekly, metformin XR 500 mg twice daily. Notes occ dizziness since starting trulicity injection. Denies low sugars or hypoglycemic symptoms. Denies paresthesias, blurry vision. Last diabetic eye exam 10/2022. Glucometer brand: one touch Ultra 2. Last foot exam: 05/2023. DSME: 2019 at [facility], started not completed. Lab Results  Component Value Date   HGBA1C 7.0 (A) 08/20/2023   Diabetic Foot Exam - Simple   No data filed    Lab Results  Component Value Date   MICROALBUR 0.9 02/11/2023         Relevant past medical, surgical, family and social history reviewed and updated as indicated. Interim medical history since our last visit reviewed. Allergies and medications reviewed and updated. Outpatient  Medications Prior to Visit  Medication Sig Dispense Refill   albuterol (VENTOLIN HFA) 108 (90 Base) MCG/ACT inhaler TAKE 2 PUFFS BY MOUTH EVERY 6 HOURS AS NEEDED FOR WHEEZE OR SHORTNESS OF BREATH 8.5 each 6   alendronate (FOSAMAX) 70 MG tablet Take 1 tablet (70 mg total) by mouth every 7 (seven) days. Take with a full glass of water on an empty stomach. 12 tablet 4   aspirin EC 81 MG tablet Take 1 tablet (81 mg total) by mouth daily. Swallow whole.     Blood Glucose Monitoring Suppl (ONE TOUCH ULTRA 2) w/Device KIT 1 each by Does not apply route daily. Use as directed to check sugar once a day 1 each 0   carvedilol (COREG) 6.25 MG tablet TAKE 1 TABLET BY MOUTH TWICE A DAY WITH FOOD 180 tablet 3   Cholecalciferol (VITAMIN D) 125 MCG (5000 UT) CAPS Take 1 capsule by mouth every Monday, Wednesday, and Friday.     clopidogrel (PLAVIX) 75 MG tablet TAKE 1 TABLET BY MOUTH EVERY DAY 90 tablet 1   Coenzyme Q10 100 MG capsule Take 1 capsule (100 mg total) by mouth 2 (two) times daily.     Cyanocobalamin (B-12) 1000 MCG SUBL Place 1 tablet under the tongue every Monday, Wednesday, and Friday.     Dulaglutide (TRULICITY) 1.5 MG/0.5ML SOPN Inject 1.5 mg into the skin once a week. 2 mL 11   Fluticasone-Umeclidin-Vilant (TRELEGY ELLIPTA) 100-62.5-25 MCG/ACT AEPB INHALE 1 PUFF BY MOUTH EVERY DAY  60 each 5   glucose blood (ONE TOUCH ULTRA TEST) test strip 1 each by Other route daily. Use as instructed to check sugar once a day. 100 each 0   hydrochlorothiazide (MICROZIDE) 12.5 MG capsule TAKE 1 CAPSULE BY MOUTH EVERY DAY 90 capsule 2   losartan (COZAAR) 50 MG tablet Take 1 tablet (50 mg total) by mouth daily. 90 tablet 4   metFORMIN (GLUCOPHAGE-XR) 500 MG 24 hr tablet Take 2 tablets (1,000 mg total) by mouth daily. 180 tablet 4   nicotine (NICODERM CQ - DOSED IN MG/24 HOURS) 14 mg/24hr patch Place 1 patch (14 mg total) onto the skin daily. 28 patch 0   nitroGLYCERIN (NITROSTAT) 0.4 MG SL tablet Place 1 tablet (0.4  mg total) under the tongue every 5 (five) minutes as needed for chest pain (up to 3 doses). 25 tablet 3   ONETOUCH DELICA LANCETS 33G MISC 1 each by Does not apply route daily. Use as directed to check sugar once a day. 100 each 0   rOPINIRole (REQUIP) 1 MG tablet Take 1 tablet (1 mg total) by mouth at bedtime. 90 tablet 1   rosuvastatin (CRESTOR) 40 MG tablet TAKE 1 TABLET BY MOUTH EVERY DAY 90 tablet 3   Varenicline Tartrate, Starter, (CHANTIX STARTING MONTH PAK) 0.5 MG X 11 & 1 MG X 42 TBPK Take one 0.5 mg tablet by mouth once daily for 3 days, then increase to one 0.5 mg tablet twice daily for 4 days, then increase to one 1 mg tablet twice daily. 60 each 0   No facility-administered medications prior to visit.     Per HPI unless specifically indicated in ROS section below Review of Systems  Objective:  BP 126/72   Pulse 72   Temp 97.7 F (36.5 C) (Oral)   Ht 5\' 6"  (1.676 m)   Wt 184 lb 6 oz (83.6 kg)   SpO2 94%   BMI 29.76 kg/m   Wt Readings from Last 3 Encounters:  08/20/23 184 lb 6 oz (83.6 kg)  05/21/23 184 lb 4 oz (83.6 kg)  04/29/23 184 lb (83.5 kg)      Physical Exam Vitals and nursing note reviewed.  Constitutional:      Appearance: Normal appearance. She is not ill-appearing.  HENT:     Mouth/Throat:     Mouth: Mucous membranes are moist.     Pharynx: Oropharynx is clear. No oropharyngeal exudate or posterior oropharyngeal erythema.  Eyes:     Extraocular Movements: Extraocular movements intact.     Conjunctiva/sclera: Conjunctivae normal.     Pupils: Pupils are equal, round, and reactive to light.  Neck:     Thyroid: No thyroid mass or thyromegaly.  Cardiovascular:     Rate and Rhythm: Normal rate and regular rhythm.     Pulses: Normal pulses.     Heart sounds: Normal heart sounds. No murmur heard. Pulmonary:     Effort: Pulmonary effort is normal. No respiratory distress.     Breath sounds: Normal breath sounds. No wheezing, rhonchi or rales.   Musculoskeletal:     Right lower leg: No edema.     Left lower leg: No edema.  Skin:    General: Skin is warm and dry.     Findings: No rash.  Neurological:     Mental Status: She is alert.  Psychiatric:        Mood and Affect: Mood normal.        Behavior: Behavior normal.  Results for orders placed or performed in visit on 08/20/23  POCT glycosylated hemoglobin (Hb A1C)  Result Value Ref Range   Hemoglobin A1C 7.0 (A) 4.0 - 5.6 %   HbA1c POC (<> result, manual entry)     HbA1c, POC (prediabetic range)     HbA1c, POC (controlled diabetic range)        08/20/2023    9:37 AM 05/21/2023    9:27 AM 02/16/2023    8:32 AM 01/30/2022    8:10 AM 01/24/2021    8:19 AM  Depression screen PHQ 2/9  Decreased Interest 3 0 0 2 2  Down, Depressed, Hopeless 3 0 0 0 2  PHQ - 2 Score 6 0 0 2 4  Altered sleeping 3 1 3  0 1  Tired, decreased energy 3 1 1 3 2   Change in appetite 3 0 0 1 2  Feeling bad or failure about yourself  0 0 0 0 0  Trouble concentrating 0 0 0 0 0  Moving slowly or fidgety/restless 0 0 0 0 0  Suicidal thoughts 0 0 0 0 0  PHQ-9 Score 15 2 4 6 9   Difficult doing work/chores Extremely dIfficult Somewhat difficult Not difficult at all Not difficult at all        08/20/2023    9:38 AM 05/21/2023    9:27 AM 02/16/2023    8:33 AM 01/30/2022    8:11 AM  GAD 7 : Generalized Anxiety Score  Nervous, Anxious, on Edge 3 0 0 0  Control/stop worrying 3 0 0 0  Worry too much - different things 3 0 0 0  Trouble relaxing 2 0 0 0  Restless 0 0 0 0  Easily annoyed or irritable 3 0 0 3  Afraid - awful might happen 3 1 0 0  Total GAD 7 Score 17 1 0 3  Anxiety Difficulty Extremely difficult Not difficult at all Not difficult at all Not difficult at all     Assessment & Plan:   Problem List Items Addressed This Visit     TOBACCO ABUSE    Continue to encourage smoking cessation - contemplative.  Awaiting lung cancer screen CT radiology read      Essential hypertension     Chronic, well controlled on current regimen - conitnue.       Depression with anxiety    Depression > anxiety, worse over the past few months, as evidenced by elevated PHQ9 and GAD7 scores.  Poor response to wellbutrin and celexa in the past. Will trial SNRI - cymbalta 20mg  daily in am. Reviewed side effects to watch for.  RTC 81mo mood f/u      Relevant Medications   DULoxetine (CYMBALTA) 20 MG capsule   Type 2 diabetes mellitus with other specified complication (HCC) - Primary    Chronic, stable on current regimen - continue.       Relevant Orders   POCT glycosylated hemoglobin (Hb A1C) (Completed)     Meds ordered this encounter  Medications   DULoxetine (CYMBALTA) 20 MG capsule    Sig: Take 1 capsule (20 mg total) by mouth daily.    Dispense:  30 capsule    Refill:  3    Orders Placed This Encounter  Procedures   POCT glycosylated hemoglobin (Hb A1C)    Patient Instructions  Let us know if lung cancer screening CT isn't read in the next 1-2 weeks.  Call to schedule colonoscopy.  Continue working on smoking cessation.  Start  cymbalta 20mg  daily in the morning.  Return in 3 months for follow up mood   Follow up plan: Return in about 3 months (around 11/18/2023), or if symptoms worsen or fail to improve, for follow up visit.  Eustaquio Boyden, MD

## 2023-08-20 NOTE — Assessment & Plan Note (Addendum)
Depression > anxiety, worse over the past few months, as evidenced by elevated PHQ9 and GAD7 scores.  Poor response to wellbutrin and celexa in the past. Will trial SNRI - cymbalta 20mg  daily in am. Reviewed side effects to watch for.  RTC 84mo mood f/u

## 2023-08-20 NOTE — Assessment & Plan Note (Addendum)
Continue to encourage smoking cessation - contemplative.  Awaiting lung cancer screen CT radiology read

## 2023-08-23 ENCOUNTER — Encounter: Payer: Self-pay | Admitting: Family Medicine

## 2023-08-24 MED ORDER — ESCITALOPRAM OXALATE 10 MG PO TABS: 10.0000 mg | ORAL_TABLET | Freq: Every day | ORAL | 3 refills | Status: DC

## 2023-08-24 NOTE — Addendum Note (Signed)
Addended by: Eustaquio Boyden on: 08/24/2023 09:10 AM   Modules accepted: Orders

## 2023-08-30 ENCOUNTER — Other Ambulatory Visit: Payer: Self-pay | Admitting: Family Medicine

## 2023-08-30 DIAGNOSIS — E1169 Type 2 diabetes mellitus with other specified complication: Secondary | ICD-10-CM

## 2023-08-30 NOTE — Telephone Encounter (Signed)
Called patient let know to reach out to pharmacy. 6 months ago a 90 day with 4 refills was called in. She will let us know if any questions.

## 2023-08-30 NOTE — Telephone Encounter (Signed)
Spoke with Marylene Land, of [facility] asking about metformin rx sent 02/16/23 and message from pt. Per Marylene Land, pt has plenty of refills left on that rx. Say she will get it filled for pt.   Lvm notifying pt I will send a response to her MyChart message.

## 2023-09-26 DIAGNOSIS — G4733 Obstructive sleep apnea (adult) (pediatric): Secondary | ICD-10-CM | POA: Diagnosis not present

## 2023-10-18 DIAGNOSIS — H25813 Combined forms of age-related cataract, bilateral: Secondary | ICD-10-CM | POA: Diagnosis not present

## 2023-10-27 DIAGNOSIS — G4733 Obstructive sleep apnea (adult) (pediatric): Secondary | ICD-10-CM | POA: Diagnosis not present

## 2023-11-07 ENCOUNTER — Other Ambulatory Visit: Payer: Self-pay | Admitting: Family Medicine

## 2023-11-07 DIAGNOSIS — I10 Essential (primary) hypertension: Secondary | ICD-10-CM

## 2023-11-19 ENCOUNTER — Ambulatory Visit: Payer: Self-pay | Admitting: Family Medicine

## 2023-11-23 DIAGNOSIS — D2272 Melanocytic nevi of left lower limb, including hip: Secondary | ICD-10-CM | POA: Diagnosis not present

## 2023-11-23 DIAGNOSIS — I788 Other diseases of capillaries: Secondary | ICD-10-CM | POA: Diagnosis not present

## 2023-11-23 DIAGNOSIS — D2262 Melanocytic nevi of left upper limb, including shoulder: Secondary | ICD-10-CM | POA: Diagnosis not present

## 2023-11-23 DIAGNOSIS — Z09 Encounter for follow-up examination after completed treatment for conditions other than malignant neoplasm: Secondary | ICD-10-CM | POA: Diagnosis not present

## 2023-11-23 DIAGNOSIS — D2271 Melanocytic nevi of right lower limb, including hip: Secondary | ICD-10-CM | POA: Diagnosis not present

## 2023-11-23 DIAGNOSIS — Z872 Personal history of diseases of the skin and subcutaneous tissue: Secondary | ICD-10-CM | POA: Diagnosis not present

## 2023-11-23 DIAGNOSIS — D2261 Melanocytic nevi of right upper limb, including shoulder: Secondary | ICD-10-CM | POA: Diagnosis not present

## 2023-11-23 DIAGNOSIS — D225 Melanocytic nevi of trunk: Secondary | ICD-10-CM | POA: Diagnosis not present

## 2023-11-24 ENCOUNTER — Telehealth: Payer: Self-pay | Admitting: Pulmonary Disease

## 2023-11-24 DIAGNOSIS — G4733 Obstructive sleep apnea (adult) (pediatric): Secondary | ICD-10-CM | POA: Diagnosis not present

## 2023-11-24 NOTE — Telephone Encounter (Signed)
 Pt states insurance has changed Trelegy is now 300 a month. Is checking with her insurance for options.

## 2023-11-24 NOTE — Telephone Encounter (Signed)
 Noted. Nothing further needed.

## 2023-11-24 NOTE — Telephone Encounter (Signed)
 Patient states does not need a call back.

## 2023-11-28 ENCOUNTER — Other Ambulatory Visit: Payer: Self-pay | Admitting: Family Medicine

## 2023-11-28 DIAGNOSIS — I2510 Atherosclerotic heart disease of native coronary artery without angina pectoris: Secondary | ICD-10-CM

## 2023-11-30 ENCOUNTER — Other Ambulatory Visit: Payer: Self-pay | Admitting: Family Medicine

## 2023-11-30 ENCOUNTER — Other Ambulatory Visit: Payer: Self-pay | Admitting: Radiology

## 2023-11-30 DIAGNOSIS — E1169 Type 2 diabetes mellitus with other specified complication: Secondary | ICD-10-CM

## 2023-11-30 NOTE — Telephone Encounter (Signed)
 Plavix Last filled:  08/30/23, #90 Last OV:  08/20/23, 3 mo DM; HTN f/u Next OV:  02/18/24, CPE

## 2023-12-01 LAB — MICROALBUMIN / CREATININE URINE RATIO
Creatinine,U: 22.8 mg/dL
Microalb Creat Ratio: 171.3 mg/g — ABNORMAL HIGH (ref 0.0–30.0)
Microalb, Ur: 3.9 mg/dL — ABNORMAL HIGH (ref 0.0–1.9)

## 2023-12-06 ENCOUNTER — Encounter: Payer: Self-pay | Admitting: Family Medicine

## 2023-12-06 DIAGNOSIS — R809 Proteinuria, unspecified: Secondary | ICD-10-CM | POA: Insufficient documentation

## 2023-12-25 DIAGNOSIS — G4733 Obstructive sleep apnea (adult) (pediatric): Secondary | ICD-10-CM | POA: Diagnosis not present

## 2024-01-24 DIAGNOSIS — G4733 Obstructive sleep apnea (adult) (pediatric): Secondary | ICD-10-CM | POA: Diagnosis not present

## 2024-02-04 ENCOUNTER — Other Ambulatory Visit: Payer: Self-pay | Admitting: Family Medicine

## 2024-02-04 DIAGNOSIS — I10 Essential (primary) hypertension: Secondary | ICD-10-CM

## 2024-02-07 ENCOUNTER — Other Ambulatory Visit: Payer: Self-pay | Admitting: Family Medicine

## 2024-02-07 DIAGNOSIS — I10 Essential (primary) hypertension: Secondary | ICD-10-CM

## 2024-02-07 DIAGNOSIS — E1169 Type 2 diabetes mellitus with other specified complication: Secondary | ICD-10-CM

## 2024-02-07 DIAGNOSIS — M810 Age-related osteoporosis without current pathological fracture: Secondary | ICD-10-CM

## 2024-02-07 DIAGNOSIS — E538 Deficiency of other specified B group vitamins: Secondary | ICD-10-CM

## 2024-02-11 ENCOUNTER — Other Ambulatory Visit (INDEPENDENT_AMBULATORY_CARE_PROVIDER_SITE_OTHER): Payer: Managed Care, Other (non HMO)

## 2024-02-11 DIAGNOSIS — M810 Age-related osteoporosis without current pathological fracture: Secondary | ICD-10-CM

## 2024-02-11 DIAGNOSIS — E1169 Type 2 diabetes mellitus with other specified complication: Secondary | ICD-10-CM | POA: Diagnosis not present

## 2024-02-11 DIAGNOSIS — E538 Deficiency of other specified B group vitamins: Secondary | ICD-10-CM | POA: Diagnosis not present

## 2024-02-11 DIAGNOSIS — I10 Essential (primary) hypertension: Secondary | ICD-10-CM

## 2024-02-11 DIAGNOSIS — E785 Hyperlipidemia, unspecified: Secondary | ICD-10-CM

## 2024-02-11 LAB — LIPID PANEL
Cholesterol: 99 mg/dL (ref 0–200)
HDL: 28.6 mg/dL — ABNORMAL LOW (ref >39.00)
LDL Cholesterol: 34 mg/dL (ref 0–99)
NonHDL: 70.52
Total CHOL/HDL Ratio: 3
Triglycerides: 181 mg/dL — ABNORMAL HIGH (ref 0.0–149.0)
VLDL: 36.2 mg/dL (ref 0.0–40.0)

## 2024-02-11 LAB — COMPREHENSIVE METABOLIC PANEL WITH GFR
ALT: 11 U/L (ref 0–35)
AST: 12 U/L (ref 0–37)
Albumin: 4.1 g/dL (ref 3.5–5.2)
Alkaline Phosphatase: 66 U/L (ref 39–117)
BUN: 11 mg/dL (ref 6–23)
CO2: 29 meq/L (ref 19–32)
Calcium: 9.5 mg/dL (ref 8.4–10.5)
Chloride: 91 meq/L — ABNORMAL LOW (ref 96–112)
Creatinine, Ser: 0.87 mg/dL (ref 0.40–1.20)
GFR: 69.04 mL/min (ref >60.00)
Glucose, Bld: 131 mg/dL — ABNORMAL HIGH (ref 70–99)
Potassium: 4.6 meq/L (ref 3.5–5.1)
Sodium: 129 meq/L — ABNORMAL LOW (ref 135–145)
Total Bilirubin: 0.6 mg/dL (ref 0.2–1.2)
Total Protein: 6.6 g/dL (ref 6.0–8.3)

## 2024-02-11 LAB — CBC WITH DIFFERENTIAL/PLATELET
Basophils Absolute: 0 10*3/uL (ref 0.0–0.1)
Basophils Relative: 0.5 % (ref 0.0–3.0)
Eosinophils Absolute: 0.3 10*3/uL (ref 0.0–0.7)
Eosinophils Relative: 3.4 % (ref 0.0–5.0)
HCT: 41.9 % (ref 36.0–46.0)
Hemoglobin: 14.7 g/dL (ref 12.0–15.0)
Lymphocytes Relative: 19.3 % (ref 12.0–46.0)
Lymphs Abs: 1.8 10*3/uL (ref 0.7–4.0)
MCHC: 35.1 g/dL (ref 30.0–36.0)
MCV: 87.4 fl (ref 78.0–100.0)
Monocytes Absolute: 0.8 10*3/uL (ref 0.1–1.0)
Monocytes Relative: 8.1 % (ref 3.0–12.0)
Neutro Abs: 6.4 10*3/uL (ref 1.4–7.7)
Neutrophils Relative %: 68.7 % (ref 43.0–77.0)
Platelets: 253 10*3/uL (ref 150.0–400.0)
RBC: 4.79 Mil/uL (ref 3.87–5.11)
RDW: 14.2 % (ref 11.5–15.5)
WBC: 9.4 10*3/uL (ref 4.0–10.5)

## 2024-02-11 LAB — VITAMIN D 25 HYDROXY (VIT D DEFICIENCY, FRACTURES): VITD: 42.4 ng/mL (ref 30.00–100.00)

## 2024-02-11 LAB — TSH: TSH: 2.72 u[IU]/mL (ref 0.35–5.50)

## 2024-02-11 LAB — MICROALBUMIN / CREATININE URINE RATIO
Creatinine,U: 15.1 mg/dL
Microalb Creat Ratio: UNDETERMINED mg/g (ref 0.0–30.0)
Microalb, Ur: <0.7 mg/dL

## 2024-02-11 LAB — HEMOGLOBIN A1C: Hgb A1c MFr Bld: 7.1 % — ABNORMAL HIGH (ref 4.6–6.5)

## 2024-02-11 LAB — VITAMIN B12: Vitamin B-12: >1500 pg/mL — ABNORMAL HIGH (ref 211–911)

## 2024-02-16 ENCOUNTER — Ambulatory Visit: Payer: Self-pay | Admitting: Family Medicine

## 2024-02-18 ENCOUNTER — Ambulatory Visit: Payer: Managed Care, Other (non HMO) | Admitting: Family Medicine

## 2024-02-18 ENCOUNTER — Encounter: Payer: Self-pay | Admitting: Family Medicine

## 2024-02-18 VITALS — BP 130/76 | HR 72 | Temp 97.9°F | Ht 64.25 in | Wt 183.4 lb

## 2024-02-18 DIAGNOSIS — Z0000 Encounter for general adult medical examination without abnormal findings: Secondary | ICD-10-CM

## 2024-02-18 DIAGNOSIS — E538 Deficiency of other specified B group vitamins: Secondary | ICD-10-CM | POA: Diagnosis not present

## 2024-02-18 DIAGNOSIS — M810 Age-related osteoporosis without current pathological fracture: Secondary | ICD-10-CM | POA: Diagnosis not present

## 2024-02-18 DIAGNOSIS — I739 Peripheral vascular disease, unspecified: Secondary | ICD-10-CM | POA: Diagnosis not present

## 2024-02-18 DIAGNOSIS — F418 Other specified anxiety disorders: Secondary | ICD-10-CM

## 2024-02-18 DIAGNOSIS — I10 Essential (primary) hypertension: Secondary | ICD-10-CM

## 2024-02-18 DIAGNOSIS — E785 Hyperlipidemia, unspecified: Secondary | ICD-10-CM

## 2024-02-18 DIAGNOSIS — G4730 Sleep apnea, unspecified: Secondary | ICD-10-CM

## 2024-02-18 DIAGNOSIS — J449 Chronic obstructive pulmonary disease, unspecified: Secondary | ICD-10-CM

## 2024-02-18 DIAGNOSIS — G2581 Restless legs syndrome: Secondary | ICD-10-CM | POA: Diagnosis not present

## 2024-02-18 DIAGNOSIS — E1169 Type 2 diabetes mellitus with other specified complication: Secondary | ICD-10-CM | POA: Diagnosis not present

## 2024-02-18 DIAGNOSIS — R809 Proteinuria, unspecified: Secondary | ICD-10-CM

## 2024-02-18 DIAGNOSIS — I6529 Occlusion and stenosis of unspecified carotid artery: Secondary | ICD-10-CM

## 2024-02-18 DIAGNOSIS — Z7189 Other specified counseling: Secondary | ICD-10-CM

## 2024-02-18 DIAGNOSIS — E1129 Type 2 diabetes mellitus with other diabetic kidney complication: Secondary | ICD-10-CM

## 2024-02-18 DIAGNOSIS — I2510 Atherosclerotic heart disease of native coronary artery without angina pectoris: Secondary | ICD-10-CM

## 2024-02-18 DIAGNOSIS — F17200 Nicotine dependence, unspecified, uncomplicated: Secondary | ICD-10-CM

## 2024-02-18 MED ORDER — GLUCOSE BLOOD VI STRP: 1.0000 | ORAL_STRIP | Freq: Every day | 3 refills | Status: DC

## 2024-02-18 MED ORDER — ONETOUCH DELICA LANCETS 33G MISC: 1.0000 | Freq: Every day | 4 refills | Status: DC

## 2024-02-18 MED ORDER — TRULICITY 1.5 MG/0.5ML ~~LOC~~ SOAJ: 1.5000 mg | SUBCUTANEOUS | 11 refills | Status: AC

## 2024-02-18 MED ORDER — ROPINIROLE HCL 1 MG PO TABS: 1.0000 mg | ORAL_TABLET | Freq: Every day | ORAL | 3 refills | Status: DC

## 2024-02-18 MED ORDER — CLOPIDOGREL BISULFATE 75 MG PO TABS: 75.0000 mg | ORAL_TABLET | Freq: Every day | ORAL | 3 refills | Status: AC

## 2024-02-18 MED ORDER — LOSARTAN POTASSIUM 50 MG PO TABS: 50.0000 mg | ORAL_TABLET | Freq: Every day | ORAL | 3 refills | Status: AC

## 2024-02-18 MED ORDER — ALENDRONATE SODIUM 70 MG PO TABS: 70.0000 mg | ORAL_TABLET | ORAL | 3 refills | Status: AC

## 2024-02-18 MED ORDER — ESCITALOPRAM OXALATE 10 MG PO TABS: 10.0000 mg | ORAL_TABLET | Freq: Every day | ORAL | 3 refills | Status: AC

## 2024-02-18 MED ORDER — METFORMIN HCL ER 500 MG PO TB24
1000.0000 mg | ORAL_TABLET | Freq: Every day | ORAL | 3 refills | Status: AC
Start: 2024-02-18 — End: ?

## 2024-02-18 NOTE — Assessment & Plan Note (Signed)
Appreciate cardiology care.  °

## 2024-02-18 NOTE — Assessment & Plan Note (Signed)
Continue to encourage smoking cessation. 

## 2024-02-18 NOTE — Assessment & Plan Note (Signed)
 Encouraged she continue working on this.

## 2024-02-18 NOTE — Assessment & Plan Note (Signed)
 Doing well with requip  - rec take 2-3 hours before bedtime.  Finds coq10 has helped the most.

## 2024-02-18 NOTE — Assessment & Plan Note (Signed)
 Chronic, stable. She forgets to take trulicity . Will schedule on Wednesdays to better remember to dose consistently.

## 2024-02-18 NOTE — Assessment & Plan Note (Signed)
 Stable period on lexapro 10mg  daily.

## 2024-02-18 NOTE — Assessment & Plan Note (Signed)
 Forgets to take fosamax .  Will schedule on Wednesdays. Rpt DEXA once more regularly on Fosamax .

## 2024-02-18 NOTE — Assessment & Plan Note (Signed)
 Levels now high - stop B12 replacement.

## 2024-02-18 NOTE — Assessment & Plan Note (Addendum)
 Not on CPAP sees pulm.

## 2024-02-18 NOTE — Assessment & Plan Note (Addendum)
 Will repeat carotid US  04/2024 - I've sent myself reminder staff message to order.

## 2024-02-18 NOTE — Assessment & Plan Note (Signed)
 Chronic, stable period on trelegy. Appreciate pulm care.

## 2024-02-18 NOTE — Patient Instructions (Addendum)
 Try cutting down on advil  PM use at night.  Schedule fosamax  and trulicity  on Wednesdays - same as your husband.  You may call [facility] at [phone] to schedule an appointment. If too long to schedule, let me know for referral to [facility] in [place].  Call to schedule mammogram at your convenience: [facility] in [place] [phone]  Bring us  copy of advanced directive when completed.  Continue co Q 10 Take Requip  2-3 hours before bedtime.  Stop b12 replacement.  Return in 6 months for follow up visit  You will be due for repeat carotid ultrasound in August.

## 2024-02-18 NOTE — Progress Notes (Signed)
 Ph: [phone] Fax: [phone]   Patient ID: Karen Osborne, female    DOB: Jan 23, 1956, 68 y.o.   MRN: [id number]  This visit was conducted in person.  BP 130/76   Pulse 72   Temp 97.9 F (36.6 C) (Oral)   Ht 5' 4.25" (1.632 m)   Wt 183 lb 6 oz (83.2 kg)   SpO2 96%   BMI 31.23 kg/m    CC: CPE Subjective:   HPI: Karen Osborne is a 68 y.o. female presenting on 02/18/2024 for Medicare Wellness   Has Medicare A only.    Notes worsening anxiety with husband's memory/illness. He continues drinking.   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 Continued smoker.    T2DM with microalbuminuria - on metformin  XR 1000mg  daily, also on losartan  50mg  daily. Latest urine normal. Forgot to continue taking Trulicity  "a while ago".    Known CAD s/p NSTEMI 2013 with DES to LAD. Myoview  2019 low risk. Continues plavix , carvedilol , rosuvastatin .    OSA - doesn't regularly use CPAP. Sees [facility] pulm Dr Sood/ Eulas Hick NP.  HST 06/2021 - mild OSA with AHI 5.98, SpO2 low of 87%.   RLS - takes requip  1mg  nightly. Also on CoQ10 100mg  daily with significant benefit in myalgias. Some nausea to medicines.   Takes advil  PM 2 tab nightly. Trouble tolerating melatonin - nausea.   Preventative: COLONOSCOPY 08/19/2018 - TAs, rpt 3 yrs (Tahiliani, Elbridge Greek, MD) - DUE # provided to schedule. Mammo 02/2023 - Birads1 @ [facility]  Well woman exam with [facility] - s/p TAH w/ BSO for heavy bleeding 20 yrs ago, no cancer - discussed watching for vaginal or vulvar cancer.  Lung cancer screening - undergoing, latest 07/2022 - known CAD, aort ATH, COPD.  DEXA 02/2022 -  T -3.4 spine, -3.0 total L hip - fosamax  started 03/2022 - but forgets to take this weekly.  Flu shot - yearly Tetanus shot - 2002, Tdap 01/2021 COVID vaccine Pfizer 11/2019, 12/2019, booster 09/2020 Pneumovax 2013, Prevnar-20 02/2023 Shingrix - 01/2020, 01/2021 Advanced directive discussion: does not have. Packet provided  today. Unsure who HCPOA would be, probably sister Karen Osborne.  Seat belt use discussed  Sunscreen use discussed. No changing moles on skin. Saw [facility] derm yearly  Current smoker 3/4 ppd, didn't try chantix  yet.  Alcohol - none  Dentist - full dentures placed 08/2020  Eye exam - yearly  Bowel - mild constipation managed with OTC exlax.  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 Interim medical history since our last visit reviewed. Allergies and medications reviewed and updated. Outpatient Medications Prior to Visit  Medication Sig Dispense Refill   albuterol  (VENTOLIN  HFA) 108 (90 Base) MCG/ACT inhaler TAKE 2 PUFFS BY MOUTH EVERY 6 HOURS AS NEEDED FOR WHEEZE OR SHORTNESS OF BREATH 8.5 each 6   aspirin  EC 81 MG tablet Take 1 tablet (81 mg total) by mouth daily. Swallow whole.     Blood Glucose Monitoring Suppl (ONE TOUCH ULTRA 2) w/Device KIT 1 each by Does not apply route daily. Use as directed to check sugar once a day 1 each 0   carvedilol  (COREG ) 6.25 MG tablet TAKE 1 TABLET BY MOUTH TWICE A DAY WITH FOOD 180 tablet 3  Cholecalciferol (VITAMIN D ) 125 MCG (5000 UT) CAPS Take 1 capsule by mouth every Monday, Wednesday, and Friday.     Coenzyme Q10 100 MG capsule Take 1 capsule (100 mg total) by mouth 2 (two) times daily.     Fluticasone -Umeclidin-Vilant (TRELEGY ELLIPTA ) 100-62.5-25 MCG/ACT AEPB INHALE 1 PUFF BY MOUTH EVERY DAY 60 each 5   hydrochlorothiazide  (MICROZIDE ) 12.5 MG capsule TAKE 1 CAPSULE BY MOUTH EVERY DAY 90 capsule 2   nicotine  (NICODERM CQ  - DOSED IN MG/24 HOURS) 14 mg/24hr patch Place 1 patch (14 mg total) onto the skin daily. 28 patch 0   nitroGLYCERIN  (NITROSTAT ) 0.4 MG SL tablet Place 1 tablet (0.4 mg total) under the  tongue every 5 (five) minutes as needed for chest pain (up to 3 doses). 25 tablet 3   rosuvastatin  (CRESTOR ) 40 MG tablet TAKE 1 TABLET BY MOUTH EVERY DAY 90 tablet 3   Varenicline  Tartrate, Starter, (CHANTIX  STARTING MONTH PAK) 0.5 MG X 11 & 1 MG X 42 TBPK Take one 0.5 mg tablet by mouth once daily for 3 days, then increase to one 0.5 mg tablet twice daily for 4 days, then increase to one 1 mg tablet twice daily. 60 each 0   alendronate  (FOSAMAX ) 70 MG tablet Take 1 tablet (70 mg total) by mouth every 7 (seven) days. Take with a full glass of water on an empty stomach. 12 tablet 4   clopidogrel  (PLAVIX ) 75 MG tablet TAKE 1 TABLET BY MOUTH EVERY DAY 90 tablet 1   Cyanocobalamin  (B-12) 1000 MCG SUBL Place 1 tablet under the tongue every Monday, Wednesday, and Friday.     escitalopram  (LEXAPRO ) 10 MG tablet Take 1 tablet (10 mg total) by mouth daily. 30 tablet 3   glucose blood (ONE TOUCH ULTRA TEST) test strip 1 each by Other route daily. Use as instructed to check sugar once a day. 100 each 0   losartan  (COZAAR ) 50 MG tablet TAKE 1 TABLET BY MOUTH EVERY DAY 90 tablet 0   metFORMIN  (GLUCOPHAGE -XR) 500 MG 24 hr tablet Take 2 tablets (1,000 mg total) by mouth daily. 180 tablet 4   ONETOUCH DELICA LANCETS 33G MISC 1 each by Does not apply route daily. Use as directed to check sugar once a day. 100 each 0   rOPINIRole  (REQUIP ) 1 MG tablet Take 1 tablet (1 mg total) by mouth at bedtime. 90 tablet 1   Dulaglutide  (TRULICITY ) 1.5 MG/0.5ML SOPN Inject 1.5 mg into the skin once a week. (Patient not taking: Reported on 02/18/2024) 2 mL 11   No facility-administered medications prior to visit.     Per HPI unless specifically indicated in ROS section below Review of Systems  Constitutional:  Negative for activity change, appetite change, chills, fatigue, fever and unexpected weight change.  HENT:  Negative for hearing loss.   Eyes:  Negative for visual disturbance.  Respiratory:  Positive for cough. Negative  for chest tightness, shortness of breath and wheezing.   Cardiovascular:  Negative for chest pain, palpitations and leg swelling.  Gastrointestinal:  Negative for abdominal distention, abdominal pain, blood in stool, constipation, diarrhea, nausea and vomiting.  Genitourinary:  Negative for difficulty urinating and hematuria.  Musculoskeletal:  Negative for arthralgias, myalgias and neck pain.  Skin:  Negative for rash.  Neurological:  Positive for dizziness. Negative for seizures, syncope and headaches.  Hematological:  Negative for adenopathy. Bruises/bleeds easily.  Psychiatric/Behavioral:  Negative for dysphoric mood. The patient is nervous/anxious.     Objective:  BP 130/76  Pulse 72   Temp 97.9 F (36.6 C) (Oral)   Ht 5' 4.25" (1.632 m)   Wt 183 lb 6 oz (83.2 kg)   SpO2 96%   BMI 31.23 kg/m   Wt Readings from Last 3 Encounters:  02/18/24 183 lb 6 oz (83.2 kg)  08/20/23 184 lb 6 oz (83.6 kg)  05/21/23 184 lb 4 oz (83.6 kg)      Physical Exam Vitals and nursing note reviewed.  Constitutional:      Appearance: Normal appearance. She is not ill-appearing.  HENT:     Head: Normocephalic and atraumatic.     Right Ear: Tympanic membrane, ear canal and external ear normal. There is no impacted cerumen.     Left Ear: Tympanic membrane, ear canal and external ear normal. There is no impacted cerumen.     Mouth/Throat:     Mouth: Mucous membranes are moist.     Pharynx: Oropharynx is clear. No oropharyngeal exudate or posterior oropharyngeal erythema.  Eyes:     General:        Right eye: No discharge.        Left eye: No discharge.     Extraocular Movements: Extraocular movements intact.     Conjunctiva/sclera: Conjunctivae normal.     Pupils: Pupils are equal, round, and reactive to light.  Neck:     Thyroid : No thyroid  mass or thyromegaly.     Vascular: Carotid bruit (L sided) present.  Cardiovascular:     Rate and Rhythm: Normal rate and regular rhythm.     Pulses:  Normal pulses.     Heart sounds: Normal heart sounds. No murmur heard. Pulmonary:     Effort: Pulmonary effort is normal. No respiratory distress.     Breath sounds: Normal breath sounds. No wheezing, rhonchi or rales.  Abdominal:     General: Bowel sounds are normal. There is no distension.     Palpations: Abdomen is soft. There is no mass.     Tenderness: There is no abdominal tenderness. There is no guarding or rebound.     Hernia: No hernia is present.  Musculoskeletal:     Cervical back: Normal range of motion and neck supple. No rigidity.     Right lower leg: No edema.     Left lower leg: No edema.  Lymphadenopathy:     Cervical: No cervical adenopathy.  Skin:    General: Skin is warm and dry.     Findings: No rash.  Neurological:     General: No focal deficit present.     Mental Status: She is alert. Mental status is at baseline.  Psychiatric:        Mood and Affect: Mood normal.        Behavior: Behavior normal.       Results for orders placed or performed in visit on 02/11/24  CBC with Differential/Platelet   Collection Time: 02/11/24  7:24 AM  Result Value Ref Range   WBC 9.4 4.0 - 10.5 K/uL   RBC 4.79 3.87 - 5.11 Mil/uL   Hemoglobin 14.7 12.0 - 15.0 g/dL   HCT 16.1 09.6 - 04.5 %   MCV 87.4 78.0 - 100.0 fl   MCHC 35.1 30.0 - 36.0 g/dL   RDW 40.9 81.1 - 91.4 %   Platelets 253.0 150.0 - 400.0 K/uL   Neutrophils Relative % 68.7 43.0 - 77.0 %   Lymphocytes Relative 19.3 12.0 - 46.0 %   Monocytes Relative 8.1 3.0 - 12.0 %  Eosinophils Relative 3.4 0.0 - 5.0 %   Basophils Relative 0.5 0.0 - 3.0 %   Neutro Abs 6.4 1.4 - 7.7 K/uL   Lymphs Abs 1.8 0.7 - 4.0 K/uL   Monocytes Absolute 0.8 0.1 - 1.0 K/uL   Eosinophils Absolute 0.3 0.0 - 0.7 K/uL   Basophils Absolute 0.0 0.0 - 0.1 K/uL  VITAMIN D  25 Hydroxy (Vit-D Deficiency, Fractures)   Collection Time: 02/11/24  7:24 AM  Result Value Ref Range   VITD 42.40 30.00 - 100.00 ng/mL  TSH   Collection Time: 02/11/24   7:24 AM  Result Value Ref Range   TSH 2.72 0.35 - 5.50 uIU/mL  Vitamin B12   Collection Time: 02/11/24  7:24 AM  Result Value Ref Range   Vitamin B-12 >1500 (H) 211 - 911 pg/mL  Microalbumin / creatinine urine ratio   Collection Time: 02/11/24  7:24 AM  Result Value Ref Range   Microalb, Ur <0.7 mg/dL   Creatinine,U 16.1 mg/dL   Microalb Creat Ratio Unable to calculate 0.0 - 30.0 mg/g  Hemoglobin A1c   Collection Time: 02/11/24  7:24 AM  Result Value Ref Range   Hgb A1c MFr Bld 7.1 (H) 4.6 - 6.5 %  Comprehensive metabolic panel with GFR   Collection Time: 02/11/24  7:24 AM  Result Value Ref Range   Sodium 129 (L) 135 - 145 mEq/L   Potassium 4.6 3.5 - 5.1 mEq/L   Chloride 91 (L) 96 - 112 mEq/L   CO2 29 19 - 32 mEq/L   Glucose, Bld 131 (H) 70 - 99 mg/dL   BUN 11 6 - 23 mg/dL   Creatinine, Ser 0.96 0.40 - 1.20 mg/dL   Total Bilirubin 0.6 0.2 - 1.2 mg/dL   Alkaline Phosphatase 66 39 - 117 U/L   AST 12 0 - 37 U/L   ALT 11 0 - 35 U/L   Total Protein 6.6 6.0 - 8.3 g/dL   Albumin 4.1 3.5 - 5.2 g/dL   GFR 04.54 >09.81 mL/min   Calcium  9.5 8.4 - 10.5 mg/dL  Lipid panel   Collection Time: 02/11/24  7:24 AM  Result Value Ref Range   Cholesterol 99 0 - 200 mg/dL   Triglycerides 191.4 (H) 0.0 - 149.0 mg/dL   HDL 78.29 (L) >56.21 mg/dL   VLDL 30.8 0.0 - 65.7 mg/dL   LDL Cholesterol 34 0 - 99 mg/dL   Total CHOL/HDL Ratio 3    NonHDL 70.52     Assessment & Plan:   Problem List Items Addressed This Visit     Health maintenance examination - Primary (Chronic)   Preventative protocols reviewed and updated unless pt declined. Discussed healthy diet and lifestyle.       Advanced directives, counseling/discussion (Chronic)   Encouraged she continue working on this.       Hyperlipidemia associated with type 2 diabetes mellitus (HCC)   Chronic, stable period on crestor  - continue. The ASCVD Risk score (Arnett DK, et al., 2019) failed to calculate for the following reasons:    Risk score cannot be calculated because patient has a medical history suggesting prior/existing ASCVD       Relevant Medications   losartan  (COZAAR ) 50 MG tablet   metFORMIN  (GLUCOPHAGE -XR) 500 MG 24 hr tablet   Dulaglutide  (TRULICITY ) 1.5 MG/0.5ML SOAJ   TOBACCO ABUSE   Continue to encourage smoking cessation.       RESTLESS LEG SYNDROME   Doing well with requip  - rec take 2-3 hours before  bedtime.  Finds coq10 has helped the most.       Relevant Medications   rOPINIRole  (REQUIP ) 1 MG tablet   Essential hypertension   Chronic, stable. Continue current regimen.       Relevant Medications   losartan  (COZAAR ) 50 MG tablet   Osteoporosis   Forgets to take fosamax .  Will schedule on Wednesdays. Rpt DEXA once more regularly on Fosamax .       Relevant Medications   alendronate  (FOSAMAX ) 70 MG tablet   Depression with anxiety   Stable period on lexapro  10mg  daily.       Relevant Medications   escitalopram  (LEXAPRO ) 10 MG tablet   CAD (coronary artery disease)   Appreciate cardiology care.       Relevant Medications   clopidogrel  (PLAVIX ) 75 MG tablet   losartan  (COZAAR ) 50 MG tablet   Type 2 diabetes mellitus with other specified complication (HCC)   Chronic, stable. She forgets to take trulicity . Will schedule on Wednesdays to better remember to dose consistently.      Relevant Medications   clopidogrel  (PLAVIX ) 75 MG tablet   glucose blood (ONE TOUCH ULTRA TEST) test strip   losartan  (COZAAR ) 50 MG tablet   metFORMIN  (GLUCOPHAGE -XR) 500 MG 24 hr tablet   OneTouch Delica Lancets 33G MISC   Dulaglutide  (TRULICITY ) 1.5 MG/0.5ML SOAJ   COPD (chronic obstructive pulmonary disease) (HCC)   Chronic, stable period on trelegy. Appreciate pulm care.       Vitamin B12 deficiency   Levels now high - stop B12 replacement.      Carotid stenosis   Will repeat carotid US  04/2024 - I've sent myself reminder staff message to order.       Relevant Medications   losartan   (COZAAR ) 50 MG tablet   PAD (peripheral artery disease) (HCC)   Continue statin, plavix .       Relevant Medications   losartan  (COZAAR ) 50 MG tablet   Mild sleep apnea   Not on CPAP sees pulm.       Type 2 diabetes mellitus with diabetic microalbuminuria, without long-term current use of insulin (HCC)   Umicroalb/cr ratio now normal. Will continue to monitor.       Relevant Medications   losartan  (COZAAR ) 50 MG tablet   metFORMIN  (GLUCOPHAGE -XR) 500 MG 24 hr tablet   Dulaglutide  (TRULICITY ) 1.5 MG/0.5ML SOAJ     Meds ordered this encounter  Medications   alendronate  (FOSAMAX ) 70 MG tablet    Sig: Take 1 tablet (70 mg total) by mouth every 7 (seven) days. Take with a full glass of water on an empty stomach.    Dispense:  12 tablet    Refill:  3   clopidogrel  (PLAVIX ) 75 MG tablet    Sig: Take 1 tablet (75 mg total) by mouth daily.    Dispense:  90 tablet    Refill:  3   escitalopram  (LEXAPRO ) 10 MG tablet    Sig: Take 1 tablet (10 mg total) by mouth daily.    Dispense:  90 tablet    Refill:  3   glucose blood (ONE TOUCH ULTRA TEST) test strip    Sig: 1 each by Other route daily. Use as instructed to check sugar once a day.    Dispense:  100 each    Refill:  3   losartan  (COZAAR ) 50 MG tablet    Sig: Take 1 tablet (50 mg total) by mouth daily.    Dispense:  90 tablet  Refill:  3   metFORMIN  (GLUCOPHAGE -XR) 500 MG 24 hr tablet    Sig: Take 2 tablets (1,000 mg total) by mouth daily.    Dispense:  180 tablet    Refill:  3   OneTouch Delica Lancets 33G MISC    Sig: 1 each by Does not apply route daily. Use as directed to check sugar once a day.    Dispense:  100 each    Refill:  4   rOPINIRole  (REQUIP ) 1 MG tablet    Sig: Take 1 tablet (1 mg total) by mouth at bedtime.    Dispense:  90 tablet    Refill:  3   Dulaglutide  (TRULICITY ) 1.5 MG/0.5ML SOAJ    Sig: Inject 1.5 mg into the skin once a week.    Dispense:  2 mL    Refill:  11    Use this dose    No  orders of the defined types were placed in this encounter.   Patient Instructions  Try cutting down on advil  PM use at night.  Schedule fosamax  and trulicity  on Wednesdays - same as your husband.  You may call [facility] at [phone] to schedule an appointment. If too long to schedule, let me know for referral to [facility] in [place].  Call to schedule mammogram at your convenience: [facility] in [place] [phone]  Bring us  copy of advanced directive when completed.  Continue co Q 10 Take Requip  2-3 hours before bedtime.  Stop b12 replacement.  Return in 6 months for follow up visit  You will be due for repeat carotid ultrasound in August.   Follow up plan: Return in about 6 months (around 08/19/2024) for follow up visit.  Claire Crick, MD

## 2024-02-18 NOTE — Assessment & Plan Note (Signed)
 Chronic, stable period on crestor  - continue. The ASCVD Risk score (Arnett DK, et al., 2019) failed to calculate for the following reasons:   Risk score cannot be calculated because patient has a medical history suggesting prior/existing ASCVD

## 2024-02-18 NOTE — Assessment & Plan Note (Signed)
 Chronic, stable. Continue current regimen.

## 2024-02-18 NOTE — Assessment & Plan Note (Signed)
 Umicroalb/cr ratio now normal. Will continue to monitor.

## 2024-02-18 NOTE — Assessment & Plan Note (Signed)
Continue statin, plavix.  

## 2024-02-18 NOTE — Assessment & Plan Note (Signed)
 Preventative protocols reviewed and updated unless pt declined. Discussed healthy diet and lifestyle.

## 2024-03-21 ENCOUNTER — Ambulatory Visit

## 2024-03-28 ENCOUNTER — Encounter: Payer: Self-pay | Admitting: Family Medicine

## 2024-03-28 DIAGNOSIS — R809 Proteinuria, unspecified: Secondary | ICD-10-CM

## 2024-03-29 MED ORDER — ACCU-CHEK GUIDE TEST VI STRP: ORAL_STRIP | 3 refills | Status: AC

## 2024-03-29 MED ORDER — ACCU-CHEK GUIDE W/DEVICE KIT: PACK | 0 refills | Status: AC

## 2024-03-29 MED ORDER — ACCU-CHEK SOFTCLIX LANCETS MISC
3 refills | Status: AC
Start: 2024-03-29 — End: ?

## 2024-03-29 NOTE — Telephone Encounter (Signed)
 E-scribed rxs for Accu-Chek Guide meter, test strips and Accu-Chek Softclix lancets to [facility].

## 2024-04-12 ENCOUNTER — Encounter: Payer: Self-pay | Admitting: Family Medicine

## 2024-04-12 DIAGNOSIS — G2581 Restless legs syndrome: Secondary | ICD-10-CM

## 2024-04-13 MED ORDER — ROPINIROLE HCL 1 MG PO TABS: 1.0000 mg | ORAL_TABLET | Freq: Every day | ORAL | 3 refills | Status: AC

## 2024-04-23 ENCOUNTER — Telehealth: Payer: Self-pay | Admitting: Family Medicine

## 2024-04-23 DIAGNOSIS — I6529 Occlusion and stenosis of unspecified carotid artery: Secondary | ICD-10-CM

## 2024-04-23 NOTE — Telephone Encounter (Signed)
 It is time to repeat carotid US  - plz notify pt I've ordered this.

## 2024-04-24 NOTE — Telephone Encounter (Signed)
 Spoke with pt relaying Dr. Talmadge message. Pt verbalizes understanding.

## 2024-05-03 ENCOUNTER — Encounter: Payer: Self-pay | Admitting: Family Medicine

## 2024-05-10 ENCOUNTER — Ambulatory Visit: Admitting: Family Medicine

## 2024-05-10 ENCOUNTER — Encounter: Payer: Self-pay | Admitting: Family Medicine

## 2024-05-10 VITALS — BP 128/68 | HR 74 | Temp 97.9°F | Ht 64.25 in | Wt 179.4 lb

## 2024-05-10 DIAGNOSIS — H6121 Impacted cerumen, right ear: Secondary | ICD-10-CM | POA: Diagnosis not present

## 2024-05-10 NOTE — Assessment & Plan Note (Addendum)
 Ear irrigation performed by CMA, pt tolerated well.   After irrigation - R TM visualized pearly grey but superior portion with mild erythema, congestion present. Pt asxs. Will reassess at f/u visit, to let me know sooner if new symptoms develop

## 2024-05-10 NOTE — Patient Instructions (Signed)
 Ear irrigation performed today. Let us  know if new or worsening symptoms

## 2024-05-10 NOTE — Progress Notes (Signed)
 Ph: [phone] Fax: [phone]   Patient ID: Karen Osborne, female    DOB: October 12, 1955, 68 y.o.   MRN: [id number]  This visit was conducted in person.  BP 128/68   Pulse 74   Temp 97.9 F (36.6 C) (Oral)   Ht 5' 4.25 (1.632 m)   Wt 179 lb 6 oz (81.4 kg)   SpO2 97%   BMI 30.55 kg/m    CC: check ear  Subjective:   HPI: Karen Osborne is a 68 y.o. female presenting on 05/10/2024 for Ear Pain (Pt C/o R ear clogged with a little discomfort. Started on 05/03/24. Pt has tried OTC meds with no relief.)   Husband suddenly passed away earlier this month after severe seizures.   1 week h/o R ear discomfort with clogged feeling, some muffled hearing from this. She's tried OTC ear drops, hot showers.   No fevers/chills, recent respiratory infections, ST, PNdrainage. No headaches or vision changes. No sinus symptoms of congestion or facial pressure     Relevant past medical, surgical, family and social history reviewed and updated as indicated. Interim medical history since our last visit reviewed. Allergies and medications reviewed and updated. Outpatient Medications Prior to Visit  Medication Sig Dispense Refill   Accu-Chek Softclix Lancets lancets Use as instructed to check blood sugar once a day 100 each 3   albuterol  (VENTOLIN  HFA) 108 (90 Base) MCG/ACT inhaler TAKE 2 PUFFS BY MOUTH EVERY 6 HOURS AS NEEDED FOR WHEEZE OR SHORTNESS OF BREATH 8.5 each 6   alendronate  (FOSAMAX ) 70 MG tablet Take 1 tablet (70 mg total) by mouth every 7 (seven) days. Take with a full glass of water on an empty stomach. 12 tablet 3   aspirin  EC 81 MG tablet Take 1 tablet (81 mg total) by mouth daily. Swallow whole.     Blood Glucose Monitoring Suppl (ACCU-CHEK GUIDE) w/Device KIT Use as instructed to check blood sugar once a day 1 kit 0   carvedilol  (COREG ) 6.25 MG tablet TAKE 1 TABLET BY MOUTH TWICE A DAY WITH FOOD 180 tablet 3   Cholecalciferol (VITAMIN D ) 125 MCG (5000 UT) CAPS Take 1 capsule by  mouth every Monday, Wednesday, and Friday.     clopidogrel  (PLAVIX ) 75 MG tablet Take 1 tablet (75 mg total) by mouth daily. 90 tablet 3   Coenzyme Q10 100 MG capsule Take 1 capsule (100 mg total) by mouth 2 (two) times daily.     Dulaglutide  (TRULICITY ) 1.5 MG/0.5ML SOAJ Inject 1.5 mg into the skin once a week. 2 mL 11   escitalopram  (LEXAPRO ) 10 MG tablet Take 1 tablet (10 mg total) by mouth daily. 90 tablet 3   Fluticasone -Umeclidin-Vilant (TRELEGY ELLIPTA ) 100-62.5-25 MCG/ACT AEPB INHALE 1 PUFF BY MOUTH EVERY DAY 60 each 5   glucose blood (ACCU-CHEK GUIDE TEST) test strip Use as instructed to check blood sugar once a day 100 each 3   hydrochlorothiazide  (MICROZIDE ) 12.5 MG capsule TAKE 1 CAPSULE BY MOUTH EVERY DAY 90 capsule 2   losartan  (COZAAR ) 50 MG tablet Take 1 tablet (50 mg total) by mouth daily. 90 tablet 3   metFORMIN  (GLUCOPHAGE -XR) 500 MG 24 hr tablet Take 2 tablets (1,000 mg total) by mouth daily. 180 tablet 3   nicotine  (NICODERM CQ  - DOSED IN MG/24 HOURS) 14 mg/24hr patch Place 1 patch (14 mg total) onto the skin daily. 28 patch 0   nitroGLYCERIN  (NITROSTAT ) 0.4 MG SL tablet Place 1 tablet (0.4 mg total) under the tongue  every 5 (five) minutes as needed for chest pain (up to 3 doses). 25 tablet 3   rOPINIRole  (REQUIP ) 1 MG tablet Take 1-2 tablets (1-2 mg total) by mouth at bedtime. 100 tablet 3   rosuvastatin  (CRESTOR ) 40 MG tablet TAKE 1 TABLET BY MOUTH EVERY DAY 90 tablet 3   Varenicline  Tartrate, Starter, (CHANTIX  STARTING MONTH PAK) 0.5 MG X 11 & 1 MG X 42 TBPK Take one 0.5 mg tablet by mouth once daily for 3 days, then increase to one 0.5 mg tablet twice daily for 4 days, then increase to one 1 mg tablet twice daily. 60 each 0   No facility-administered medications prior to visit.     Per HPI unless specifically indicated in ROS section below Review of Systems  Objective:  BP 128/68   Pulse 74   Temp 97.9 F (36.6 C) (Oral)   Ht 5' 4.25 (1.632 m)   Wt 179 lb 6 oz  (81.4 kg)   SpO2 97%   BMI 30.55 kg/m   Wt Readings from Last 3 Encounters:  05/10/24 179 lb 6 oz (81.4 kg)  02/18/24 183 lb 6 oz (83.2 kg)  08/20/23 184 lb 6 oz (83.6 kg)      Physical Exam Vitals and nursing note reviewed.  Constitutional:      Appearance: Normal appearance. She is not ill-appearing.  HENT:     Head: Normocephalic and atraumatic.     Right Ear: Ear canal and external ear normal. Decreased hearing noted. There is impacted cerumen.     Left Ear: Tympanic membrane, ear canal and external ear normal. No decreased hearing noted. There is no impacted cerumen.     Ears:     Comments:  After irrigation - R TM visualized pearly grey but superior portion with mild erythema, congestion present. Pt asxs. Will reassess at f/u visit, to let me know sooner if new symptoms develop    Nose: Nose normal. No congestion or rhinorrhea.     Mouth/Throat:     Mouth: Mucous membranes are moist.     Pharynx: Oropharynx is clear. No oropharyngeal exudate or posterior oropharyngeal erythema.  Eyes:     Extraocular Movements: Extraocular movements intact.     Conjunctiva/sclera: Conjunctivae normal.     Pupils: Pupils are equal, round, and reactive to light.  Musculoskeletal:     Cervical back: Normal range of motion and neck supple.  Lymphadenopathy:     Head:     Right side of head: No submental, submandibular, tonsillar, preauricular or posterior auricular adenopathy.     Left side of head: No submental, submandibular, tonsillar, preauricular or posterior auricular adenopathy.     Cervical: No cervical adenopathy.     Right cervical: No superficial cervical adenopathy.    Left cervical: No superficial cervical adenopathy.     Upper Body:     Right upper body: No supraclavicular adenopathy.     Left upper body: No supraclavicular adenopathy.  Skin:    Findings: No rash.  Neurological:     Mental Status: She is alert.  Psychiatric:        Mood and Affect: Mood normal.         Behavior: Behavior normal.    R ear irrigation: IC obtained. Ear irrigation performed today with clearing of impacetd cerumen, TM visualized without perforation (sdee above), pt tolerated well.       Assessment & Plan:   Problem List Items Addressed This Visit     Hearing loss  of right ear due to cerumen impaction - Primary   Ear irrigation performed by CMA, pt tolerated well.   After irrigation - R TM visualized pearly grey but superior portion with mild erythema, congestion present. Pt asxs. Will reassess at f/u visit, to let me know sooner if new symptoms develop        No orders of the defined types were placed in this encounter.   No orders of the defined types were placed in this encounter.   Patient Instructions  Ear irrigation performed today. Let us  know if new or worsening symptoms  Follow up plan: No follow-ups on file.  Anton Blas, MD

## 2024-05-22 ENCOUNTER — Other Ambulatory Visit: Payer: Self-pay

## 2024-05-22 DIAGNOSIS — J449 Chronic obstructive pulmonary disease, unspecified: Secondary | ICD-10-CM

## 2024-05-22 MED ORDER — TRELEGY ELLIPTA 100-62.5-25 MCG/ACT IN AEPB: INHALATION_SPRAY | RESPIRATORY_TRACT | 5 refills | Status: DC

## 2024-05-22 MED ORDER — ALBUTEROL SULFATE HFA 108 (90 BASE) MCG/ACT IN AERS
1.0000 | INHALATION_SPRAY | RESPIRATORY_TRACT | 6 refills | Status: DC | PRN
Start: 2024-05-22 — End: 2024-07-05

## 2024-05-22 NOTE — Telephone Encounter (Signed)
 Copied from CRM [id number]. Topic: Appointments - Appointment Scheduling >> May 22, 2024  1:44 PM Chantha C wrote: Patient asked if medications: Fluticasone -Umeclidin-Vilant (TRELEGY ELLIPTA ) 100-62.5-25 MCG/ACT AEPB and albuterol  (VENTOLIN  HFA) 108 (90 Base) MCG/ACT inhaler can be refill until patient is seen with Dr. Isaiah 07/05/24 at 8:30 am? Patient states has one morwe puff in the inhaler. Please advise and call back.   [facility] [id number], [place] -  [address] [place] [address] Phone: [phone] Fax: [phone]

## 2024-06-06 ENCOUNTER — Encounter

## 2024-06-06 ENCOUNTER — Ambulatory Visit (HOSPITAL_COMMUNITY)
Admission: RE | Admit: 2024-06-06 | Discharge: 2024-06-06 | Disposition: A | Discharge status: Home / Self Care | Source: Ambulatory Visit | Attending: Family Medicine | Admitting: Family Medicine

## 2024-06-06 DIAGNOSIS — I6529 Occlusion and stenosis of unspecified carotid artery: Secondary | ICD-10-CM | POA: Diagnosis not present

## 2024-06-06 DIAGNOSIS — I6522 Occlusion and stenosis of left carotid artery: Secondary | ICD-10-CM | POA: Insufficient documentation

## 2024-06-12 ENCOUNTER — Ambulatory Visit: Payer: Self-pay | Admitting: Family Medicine

## 2024-06-12 DIAGNOSIS — I6529 Occlusion and stenosis of unspecified carotid artery: Secondary | ICD-10-CM

## 2024-07-05 ENCOUNTER — Ambulatory Visit (INDEPENDENT_AMBULATORY_CARE_PROVIDER_SITE_OTHER): Admitting: Nurse Practitioner

## 2024-07-05 ENCOUNTER — Encounter: Payer: Self-pay | Admitting: Nurse Practitioner

## 2024-07-05 ENCOUNTER — Encounter: Admitting: Internal Medicine

## 2024-07-05 ENCOUNTER — Ambulatory Visit: Admitting: Nurse Practitioner

## 2024-07-05 VITALS — BP 110/68 | HR 77 | Temp 97.6°F | Ht 64.25 in | Wt 174.6 lb

## 2024-07-05 DIAGNOSIS — F17200 Nicotine dependence, unspecified, uncomplicated: Secondary | ICD-10-CM | POA: Diagnosis not present

## 2024-07-05 DIAGNOSIS — Z23 Encounter for immunization: Secondary | ICD-10-CM | POA: Diagnosis not present

## 2024-07-05 DIAGNOSIS — J449 Chronic obstructive pulmonary disease, unspecified: Secondary | ICD-10-CM | POA: Diagnosis not present

## 2024-07-05 DIAGNOSIS — G4730 Sleep apnea, unspecified: Secondary | ICD-10-CM

## 2024-07-05 MED ORDER — TRELEGY ELLIPTA 100-62.5-25 MCG/ACT IN AEPB: INHALATION_SPRAY | RESPIRATORY_TRACT | 11 refills | Status: AC

## 2024-07-05 MED ORDER — ALBUTEROL SULFATE HFA 108 (90 BASE) MCG/ACT IN AERS: 1.0000 | INHALATION_SPRAY | RESPIRATORY_TRACT | 2 refills | Status: AC | PRN

## 2024-07-05 NOTE — Patient Instructions (Addendum)
 Continue Albuterol  inhaler 2 puffs every 6 hours as needed for shortness of breath or wheezing. Notify if symptoms persist despite rescue inhaler/neb use. Continue Trelegy 1 puff daily. Brush tongue and rinse mouth afterwards  Flu shot today  Ok to stay off CPAP as you have very mild sleep apnea. This poses minimal risks on the cardiovascular system, unlike more severe forms. Encouraged healthy weight management and side lying sleeping position  Work on quitting smoking, as able   Follow up in 1 year with any new MD (new pt 30 min slot). If symptoms do not improve or worsen, please contact office for sooner follow up or seek emergency care.

## 2024-07-05 NOTE — Assessment & Plan Note (Signed)
 Stable on current regimen. No recent exacerbations. Inhalers refilled. Trigger prevention reviewed. Smoking cessation strongly encouraged. Action plan in place.  Patient Instructions  Continue Albuterol  inhaler 2 puffs every 6 hours as needed for shortness of breath or wheezing. Notify if symptoms persist despite rescue inhaler/neb use. Continue Trelegy 1 puff daily. Brush tongue and rinse mouth afterwards  Flu shot today  Ok to stay off CPAP as you have very mild sleep apnea. This poses minimal risks on the cardiovascular system, unlike more severe forms. Encouraged healthy weight management and side lying sleeping position  Work on quitting smoking, as able   Follow up in 1 year with any new MD (new pt 30 min slot). If symptoms do not improve or worsen, please contact office for sooner follow up or seek emergency care.

## 2024-07-05 NOTE — Assessment & Plan Note (Signed)
 Active smoker. Continue with lung cancer screening program

## 2024-07-05 NOTE — Progress Notes (Signed)
 @Patient  ID: Karen Osborne, female    DOB: 15-Apr-1956, 68 y.o.   MRN: [id number]  Chief Complaint  Patient presents with   COPD    Cough with clear sputum. No SOB or wheezing. Trelegy daily helps with her breathing. Albuterol  PRN.    Sleep Apnea    Not using CPAP machine.    Referring provider: Rilla Baller, MD  HPI: 68 year old female, active smoker followed for COPD and mild OSA. She is a former patient of Dr. Magdaleno and last seen in 2024 by Hope, NP. Past medical history significant for HTN, PAD, DM, HLD, RLS, depression, anxiety.   TEST/EVENTS:  Reviewed today, 07/05/2024 09/28/2020 HST: AHI 5.8/h 05/21/2021 PFT: FVC 74, FEV1 70, ratio 76, TLC 86, DLCO 73 08/02/2023 LDCT chest: atherosclerosis. Emphysema. 2.7 mm LUL nodule, stable. Smoking related bronchiolitis. Lung RADS 2.   11/13/2022: SHERLEAN with Hope NP. She was started on CPAP for mild OSA. Used for a week then stopped. Would like to resume use but needs mask fitting. Current smoker. Needs refills of nicotine  patch and chantix . Cough getting worse. Clear sputum. Breathing is ok. On Trelegy.   07/05/2024: Today - follow up Discussed the use of AI scribe software for clinical note transcription with the patient, who gave verbal consent to proceed.  History of Present Illness Karen Osborne is a 68 year old female with COPD who presents for a follow-up visit.  She has a persistent cough and continues to smoke. No hemoptysis or unexplained weight loss. Breathing is stable. Feels trelegy helps. No recent exacerbations. No wheezing, chest congestion, leg swelling, CP. She is due for her lung cancer screening CT next month.  She has mild sleep apnea and owns a CPAP machine but does not use it regularly. No drowsy driving. No sleep parasomnias. Energy levels are okay.   She uses her albuterol  inhaler four to five times a month and requires a refill. She also needs a refill for her Trelegy inhaler.  She has not yet received her  flu shot this year.  Husband passed away earlier this year. Feels she is coping appropriately. Her son moved back in with her, which has helped.     Allergies  Allergen Reactions   Penicillins Rash    Immunization History  Administered Date(s) Administered   Fluad Quad(high Dose 65+) 08/03/2022   Fluad Trivalent(High Dose 65+) 05/21/2023   INFLUENZA, HIGH DOSE SEASONAL PF 07/05/2024   Influenza, Quadrivalent, Recombinant, Inj, Pf 06/03/2019   Influenza,inj,Quad PF,6+ Mos 05/28/2016, 07/05/2018, 09/28/2020   Influenza-Unspecified 07/24/2017   PFIZER(Purple Top)SARS-COV-2 Vaccination 12/06/2019, 01/03/2020, 09/28/2020   PNEUMOCOCCAL CONJUGATE-20 02/16/2023   Pneumococcal Polysaccharide-23 01/27/2012   Td 09/14/2000   Tdap 01/24/2021   Zoster Recombinant(Shingrix) 01/23/2020, 01/24/2021    Past Medical History:  Diagnosis Date   Allergy    Anxiety    BRCA negative 08/2015   MyRisk neg   Chronic diastolic CHF (congestive heart failure) (HCC)    COPD (chronic obstructive pulmonary disease) with emphysema (HCC) 2019   Coronary artery disease    a. s/p NSTEMI - LHC 01/27/12: prox LAD 20%, mid LAD 99%, prox OM 30%, prox RCA 30%, mid RCA 50%, dist RCA 30%, EF 60%.  PCI:  2.25 x 16 mm Promus Element DES to mid LAD //  b. Myoview  (7/15):  Fixed apical defect (breast attenuation versus scar), no ischemia, EF 64%; low risk  // Myoview  7/22: EF 69, no ischemia or infarction; low risk   COVID-19 virus  infection 07/2021   Rx molnupiravir  -> rash so stopped early   Depression    Diabetes mellitus without complication (HCC) 2019   Emphysema of lung (HCC)    Family history of ovarian cancer    Glucose intolerance (impaired glucose tolerance)    A1C 6.1 01/2012   History of echocardiogram    Echo (01/28/12):  Mild LVH, EF 55%, apical septum and true apex severe HK, Gr 2 DD, mild LAE, normal RVF // Echo 7/18: mild LVH, EF 60-65, no RWMA, Gr 2 DD, normal RVF   History of nuclear stress test     Nuclear stress test 4/19: EF 74, apicoseptal defect c/w atten artifact, normal perfusion; Low Risk   Hypercholesteremia    Hypertension    Myocardial infarction (HCC)    NSVD (normal spontaneous vaginal delivery) 1988   Osteopenia 2016   hip   Osteoporosis 2019   in spine and femoral neck; [facility]   Restless leg syndrome    Sleep apnea    Stroke (HCC)    Tobacco abuse     Tobacco History: Social History   Tobacco Use  Smoking Status Every Day   Current packs/day: 0.50   Average packs/day: 1.9 packs/day for 51.2 years (98.2 ttl pk-yrs)   Types: Cigarettes   Start date: 2023  Smokeless Tobacco Never  Tobacco Comments   0.75 PPD- khj 07/05/2024   Ready to quit: Not Answered Counseling given: Not Answered Tobacco comments: 0.75 PPD- khj 07/05/2024   Outpatient Medications Prior to Visit  Medication Sig Dispense Refill   Accu-Chek Softclix Lancets lancets Use as instructed to check blood sugar once a day 100 each 3   alendronate  (FOSAMAX ) 70 MG tablet Take 1 tablet (70 mg total) by mouth every 7 (seven) days. Take with a full glass of water on an empty stomach. 12 tablet 3   aspirin  EC 81 MG tablet Take 1 tablet (81 mg total) by mouth daily. Swallow whole.     Blood Glucose Monitoring Suppl (ACCU-CHEK GUIDE) w/Device KIT Use as instructed to check blood sugar once a day 1 kit 0   carvedilol  (COREG ) 6.25 MG tablet TAKE 1 TABLET BY MOUTH TWICE A DAY WITH FOOD 180 tablet 3   Cholecalciferol (VITAMIN D ) 125 MCG (5000 UT) CAPS Take 1 capsule by mouth every Monday, Wednesday, and Friday.     clopidogrel  (PLAVIX ) 75 MG tablet Take 1 tablet (75 mg total) by mouth daily. 90 tablet 3   Coenzyme Q10 100 MG capsule Take 1 capsule (100 mg total) by mouth 2 (two) times daily.     Dulaglutide  (TRULICITY ) 1.5 MG/0.5ML SOAJ Inject 1.5 mg into the skin once a week. 2 mL 11   escitalopram  (LEXAPRO ) 10 MG tablet Take 1 tablet (10 mg total) by mouth daily. 90 tablet 3   glucose blood (ACCU-CHEK  GUIDE TEST) test strip Use as instructed to check blood sugar once a day 100 each 3   hydrochlorothiazide  (MICROZIDE ) 12.5 MG capsule TAKE 1 CAPSULE BY MOUTH EVERY DAY 90 capsule 2   losartan  (COZAAR ) 50 MG tablet Take 1 tablet (50 mg total) by mouth daily. 90 tablet 3   metFORMIN  (GLUCOPHAGE -XR) 500 MG 24 hr tablet Take 2 tablets (1,000 mg total) by mouth daily. 180 tablet 3   nitroGLYCERIN  (NITROSTAT ) 0.4 MG SL tablet Place 1 tablet (0.4 mg total) under the tongue every 5 (five) minutes as needed for chest pain (up to 3 doses). 25 tablet 3   rOPINIRole  (REQUIP ) 1  MG tablet Take 1-2 tablets (1-2 mg total) by mouth at bedtime. 100 tablet 3   rosuvastatin  (CRESTOR ) 40 MG tablet TAKE 1 TABLET BY MOUTH EVERY DAY 90 tablet 3   albuterol  (VENTOLIN  HFA) 108 (90 Base) MCG/ACT inhaler Inhale 1-2 puffs into the lungs every 4 (four) hours as needed for wheezing or shortness of breath (or cough.). 8.5 each 6   Fluticasone -Umeclidin-Vilant (TRELEGY ELLIPTA ) 100-62.5-25 MCG/ACT AEPB INHALE 1 PUFF BY MOUTH EVERY DAY 60 each 5   nicotine  (NICODERM CQ  - DOSED IN MG/24 HOURS) 14 mg/24hr patch Place 1 patch (14 mg total) onto the skin daily. (Patient not taking: Reported on 07/05/2024) 28 patch 0   Varenicline  Tartrate, Starter, (CHANTIX  STARTING MONTH PAK) 0.5 MG X 11 & 1 MG X 42 TBPK Take one 0.5 mg tablet by mouth once daily for 3 days, then increase to one 0.5 mg tablet twice daily for 4 days, then increase to one 1 mg tablet twice daily. (Patient not taking: Reported on 07/05/2024) 60 each 0   No facility-administered medications prior to visit.     Review of Systems: as above    Physical Exam:  BP 110/68   Pulse 77   Temp 97.6 F (36.4 C)   Ht 5' 4.25 (1.632 m)   Wt 174 lb 9.6 oz (79.2 kg)   SpO2 98%   BMI 29.74 kg/m   GEN: Pleasant, interactive, well-appearing; in no acute distress HEENT:  Normocephalic and atraumatic. PERRLA. Sclera white. Nasal turbinates pink, moist and patent bilaterally.  No rhinorrhea present. Oropharynx pink and moist, without exudate or edema. No lesions, ulcerations, or postnasal drip. Mallampati III NECK:  Supple w/ fair ROM. No lymphadenopathy.   CV: RRR, no m/r/g, no peripheral edema. Pulses intact, +2 bilaterally. No cyanosis, pallor or clubbing. PULMONARY:  Unlabored, regular breathing. Clear bilaterally A&P w/o wheezes/rales/rhonchi. No accessory muscle use.  GI: BS present and normoactive. Soft, non-tender to palpation. MSK: No erythema, warmth or tenderness. Cap refil <2 sec all extrem.  Neuro: A/Ox3. No focal deficits noted.   Skin: Warm, no lesions or rashe Psych: Normal affect and behavior. Judgement and thought content appropriate.     Lab Results:  CBC    Component Value Date/Time   WBC 9.4 02/11/2024 0724   RBC 4.79 02/11/2024 0724   HGB 14.7 02/11/2024 0724   HGB 14.6 01/07/2018 0909   HCT 41.9 02/11/2024 0724   HCT 42.5 01/07/2018 0909   PLT 253.0 02/11/2024 0724   PLT 271 01/07/2018 0909   MCV 87.4 02/11/2024 0724   MCV 91 01/07/2018 0909   MCH 31.4 01/07/2018 0909   MCH 31.5 01/28/2012 0146   MCHC 35.1 02/11/2024 0724   RDW 14.2 02/11/2024 0724   RDW 12.9 01/07/2018 0909   LYMPHSABS 1.8 02/11/2024 0724   MONOABS 0.8 02/11/2024 0724   EOSABS 0.3 02/11/2024 0724   BASOSABS 0.0 02/11/2024 0724    BMET    Component Value Date/Time   NA 129 (L) 02/11/2024 0724   NA 137 02/04/2018 0735   K 4.6 02/11/2024 0724   CL 91 (L) 02/11/2024 0724   CO2 29 02/11/2024 0724   GLUCOSE 131 (H) 02/11/2024 0724   BUN 11 02/11/2024 0724   BUN 14 02/04/2018 0735   CREATININE 0.87 02/11/2024 0724   CALCIUM  9.5 02/11/2024 0724   GFRNONAA 56 (L) 02/04/2018 0735   GFRAA 65 02/04/2018 0735    BNP No results found for: BNP   Imaging:  VAS US  CAROTID Result  Date: 06/07/2024 Carotid Arterial Duplex Study Patient Name:  AMELIE CARACCI  Date of Exam:   06/06/2024 Medical Rec #: [id number]       Accession #:    [id number] Date of Birth:  [birth year]        Patient Gender: F Patient Age:   63 years Exam Location:  [address] Procedure:      VAS US  CAROTID Referring Phys: ANTON BLAS --------------------------------------------------------------------------------  Indications:       Carotid artery disease. Risk Factors:      Hypertension, hyperlipidemia, Diabetes, current smoker,                    coronary artery disease, PAD. Comparison Study:  Previous carotid duplex performed 04/23/23 showed RICA                    velocities of 132/35 cm/sec and LICA velocities of 124/28                    cm/sec. Performing Technologist: Edsel Mustard RVT  Examination Guidelines: A complete evaluation includes B-mode imaging, spectral Doppler, color Doppler, and power Doppler as needed of all accessible portions of each vessel. Bilateral testing is considered an integral part of a complete examination. Limited examinations for reoccurring indications may be performed as noted.  Right Carotid Findings: +----------+--------+--------+--------+------------------+--------+           PSV cm/sEDV cm/sStenosisPlaque DescriptionComments +----------+--------+--------+--------+------------------+--------+ CCA Prox  92      19                                         +----------+--------+--------+--------+------------------+--------+ CCA Distal75      26                                         +----------+--------+--------+--------+------------------+--------+ ICA Prox  134     35      1-39%   hyperechoic                +----------+--------+--------+--------+------------------+--------+ ICA Mid   143     31                                         +----------+--------+--------+--------+------------------+--------+ ECA       150     13                                         +----------+--------+--------+--------+------------------+--------+ +----------+--------+-------+----------------+-------------------+           PSV  cm/sEDV cmsDescribe        Arm Pressure (mmHG) +----------+--------+-------+----------------+-------------------+ [id number]            Multiphasic, [id number]                 +----------+--------+-------+----------------+-------------------+ +---------+--------+--+--------+--+---------+ VertebralPSV cm/s91EDV cm/s22Antegrade +---------+--------+--+--------+--+---------+  Left Carotid Findings: +----------+--------+--------+--------+------------------+--------+           PSV cm/sEDV cm/sStenosisPlaque DescriptionComments +----------+--------+--------+--------+------------------+--------+ CCA Prox  84      19                                         +----------+--------+--------+--------+------------------+--------+  CCA Distal96      28                                         +----------+--------+--------+--------+------------------+--------+ ICA Prox  155     44      40-59%  heterogenous               +----------+--------+--------+--------+------------------+--------+ ICA Mid   131     40                                         +----------+--------+--------+--------+------------------+--------+ ICA Distal174     49                                         +----------+--------+--------+--------+------------------+--------+ +----------+--------+--------+----------------+-------------------+           PSV cm/sEDV cm/sDescribe        Arm Pressure (mmHG) +----------+--------+--------+----------------+-------------------+ [id number]             Multiphasic, [id number]                 +----------+--------+--------+----------------+-------------------+ +---------+--------+--+--------+--+---------+ VertebralPSV cm/s61EDV cm/s24Antegrade +---------+--------+--+--------+--+---------+   Summary: Right Carotid: Velocities in the right ICA are consistent with a 1-39% stenosis. Left Carotid: Velocities in the left ICA are consistent with a 40-59% stenosis.  Vertebrals:  Bilateral vertebral arteries demonstrate antegrade flow. Subclavians: Normal flow hemodynamics were seen in bilateral subclavian              arteries. *See table(s) above for measurements and observations.  Electronically signed by Dorn Lesches MD on 06/07/2024 at 12:58:09 PM.    Final     Administration History     None          Latest Ref Rng & Units 05/21/2021    4:45 PM  PFT Results  FVC-Pre L 2.37   FVC-Predicted Pre % 74   FVC-Post L 2.35   FVC-Predicted Post % 73   Pre FEV1/FVC % % 73   Post FEV1/FCV % % 76   FEV1-Pre L 1.71   FEV1-Predicted Pre % 70   FEV1-Post L 1.78   DLCO uncorrected ml/min/mmHg 14.55   DLCO UNC% % 73   DLVA Predicted % 82   TLC L 4.35   TLC % Predicted % 86   RV % Predicted % 93     No results found for: NITRICOXIDE      Assessment & Plan:   COPD (chronic obstructive pulmonary disease) (HCC) Stable on current regimen. No recent exacerbations. Inhalers refilled. Trigger prevention reviewed. Smoking cessation strongly encouraged. Action plan in place.  Patient Instructions  Continue Albuterol  inhaler 2 puffs every 6 hours as needed for shortness of breath or wheezing. Notify if symptoms persist despite rescue inhaler/neb use. Continue Trelegy 1 puff daily. Brush tongue and rinse mouth afterwards  Flu shot today  Ok to stay off CPAP as you have very mild sleep apnea. This poses minimal risks on the cardiovascular system, unlike more severe forms. Encouraged healthy weight management and side lying sleeping position  Work on quitting smoking, as able   Follow up in 1 year with any new MD (new pt 30 min slot). If symptoms do not improve or worsen,  please contact office for sooner follow up or seek emergency care.    Mild sleep apnea Very mild OSA. Minimal cardiovascular risks. Encouraged healthy weight loss and positional sleeping. Safe driving practices reviewed.   TOBACCO ABUSE Active smoker. Continue with lung cancer  screening program   Advised if symptoms do not improve or worsen, to please contact office for sooner follow up or seek emergency care.   I spent 25 minutes of dedicated to the care of this patient on the date of this encounter to include pre-visit review of records, face-to-face time with the patient discussing conditions above, post visit ordering of testing, clinical documentation with the electronic health record, making appropriate referrals as documented, and communicating necessary findings to members of the patients care team.  Comer LULLA Rouleau, NP 07/05/2024  Pt aware and understands NP's role.

## 2024-07-05 NOTE — Assessment & Plan Note (Signed)
 Very mild OSA. Minimal cardiovascular risks. Encouraged healthy weight loss and positional sleeping. Safe driving practices reviewed.

## 2024-07-18 ENCOUNTER — Other Ambulatory Visit: Payer: Self-pay | Admitting: Cardiovascular Disease

## 2024-07-18 DIAGNOSIS — E7800 Pure hypercholesterolemia, unspecified: Secondary | ICD-10-CM

## 2024-07-31 ENCOUNTER — Ambulatory Visit

## 2024-08-02 ENCOUNTER — Inpatient Hospital Stay
Admission: RE | Admit: 2024-08-02 | Discharge: 2024-08-02 | Disposition: A | Source: Ambulatory Visit | Attending: Acute Care | Admitting: Acute Care

## 2024-08-02 DIAGNOSIS — Z87891 Personal history of nicotine dependence: Secondary | ICD-10-CM

## 2024-08-02 DIAGNOSIS — F17210 Nicotine dependence, cigarettes, uncomplicated: Secondary | ICD-10-CM

## 2024-08-02 DIAGNOSIS — Z122 Encounter for screening for malignant neoplasm of respiratory organs: Secondary | ICD-10-CM

## 2024-08-07 ENCOUNTER — Encounter: Payer: Self-pay | Admitting: Family Medicine

## 2024-08-09 ENCOUNTER — Other Ambulatory Visit: Payer: Self-pay

## 2024-08-09 DIAGNOSIS — Z122 Encounter for screening for malignant neoplasm of respiratory organs: Secondary | ICD-10-CM

## 2024-08-09 DIAGNOSIS — Z87891 Personal history of nicotine dependence: Secondary | ICD-10-CM

## 2024-08-09 DIAGNOSIS — F17210 Nicotine dependence, cigarettes, uncomplicated: Secondary | ICD-10-CM

## 2024-08-15 ENCOUNTER — Other Ambulatory Visit: Payer: Self-pay | Admitting: Cardiovascular Disease

## 2024-08-15 DIAGNOSIS — E7800 Pure hypercholesterolemia, unspecified: Secondary | ICD-10-CM

## 2024-08-16 ENCOUNTER — Other Ambulatory Visit: Payer: Self-pay | Admitting: Family Medicine

## 2024-08-16 DIAGNOSIS — Z1231 Encounter for screening mammogram for malignant neoplasm of breast: Secondary | ICD-10-CM

## 2024-08-17 ENCOUNTER — Telehealth: Payer: Self-pay | Admitting: Cardiovascular Disease

## 2024-08-17 MED ORDER — HYDROCHLOROTHIAZIDE 12.5 MG PO CAPS: 12.5000 mg | ORAL_CAPSULE | Freq: Every day | ORAL | 0 refills | Status: DC

## 2024-08-17 NOTE — Telephone Encounter (Signed)
 Pt said she received a call from [place] and that she was to call him back.

## 2024-08-17 NOTE — Telephone Encounter (Signed)
 Called patient back. Looks to be our scheduling team had reached out to patient this afternoon to schedule her 23-month follow up appointment. I went ahead and made this appointment for the patient with Dr Darron for next year on 1/7. Patient not complaining of any issues at this time. Doing okay.

## 2024-08-21 ENCOUNTER — Ambulatory Visit: Admitting: Family Medicine

## 2024-08-21 ENCOUNTER — Encounter: Payer: Self-pay | Admitting: Family Medicine

## 2024-08-21 VITALS — BP 130/80 | HR 73 | Temp 97.7°F | Ht 64.25 in | Wt 173.6 lb

## 2024-08-21 DIAGNOSIS — E1169 Type 2 diabetes mellitus with other specified complication: Secondary | ICD-10-CM

## 2024-08-21 DIAGNOSIS — I10 Essential (primary) hypertension: Secondary | ICD-10-CM

## 2024-08-21 DIAGNOSIS — E871 Hypo-osmolality and hyponatremia: Secondary | ICD-10-CM | POA: Insufficient documentation

## 2024-08-21 DIAGNOSIS — R339 Retention of urine, unspecified: Secondary | ICD-10-CM | POA: Insufficient documentation

## 2024-08-21 DIAGNOSIS — E538 Deficiency of other specified B group vitamins: Secondary | ICD-10-CM

## 2024-08-21 LAB — POC URINALSYSI DIPSTICK (AUTOMATED)
Bilirubin, UA: NEGATIVE
Blood, UA: NEGATIVE
Glucose, UA: NEGATIVE
Ketones, UA: NEGATIVE
Nitrite, UA: NEGATIVE
Protein, UA: NEGATIVE
Spec Grav, UA: <=1.005 — AB (ref 1.010–1.025)
Urobilinogen, UA: NEGATIVE U/dL — AB
pH, UA: 6.5 (ref 5.0–8.0)

## 2024-08-21 LAB — VITAMIN B12: Vitamin B-12: 298 pg/mL (ref 211–911)

## 2024-08-21 LAB — BASIC METABOLIC PANEL WITH GFR
BUN: 8 mg/dL (ref 6–23)
CO2: 28 meq/L (ref 19–32)
Calcium: 9.6 mg/dL (ref 8.4–10.5)
Chloride: 101 meq/L (ref 96–112)
Creatinine, Ser: 0.78 mg/dL (ref 0.40–1.20)
GFR: 78.41 mL/min (ref >60.00)
Glucose, Bld: 101 mg/dL — ABNORMAL HIGH (ref 70–99)
Potassium: 4.6 meq/L (ref 3.5–5.1)
Sodium: 139 meq/L (ref 135–145)

## 2024-08-21 LAB — POCT GLYCOSYLATED HEMOGLOBIN (HGB A1C): Hemoglobin A1C: 6.1 % — AB (ref 4.0–5.6)

## 2024-08-21 MED ORDER — SULFAMETHOXAZOLE-TRIMETHOPRIM 800-160 MG PO TABS: 1.0000 | ORAL_TABLET | Freq: Two times a day (BID) | ORAL | 0 refills | Status: AC

## 2024-08-21 NOTE — Assessment & Plan Note (Signed)
 Chronic, great control on current regimen - continue.  Update Na in hydrochlorothiazide  use.

## 2024-08-21 NOTE — Assessment & Plan Note (Signed)
 UA suspicious for infection. UCx sent Treat with 3d bactrim  DS BID.

## 2024-08-21 NOTE — Assessment & Plan Note (Addendum)
 Chronic, great control on current regimen - continue.  Foot exam today.

## 2024-08-21 NOTE — Assessment & Plan Note (Signed)
 Noted low reading on last labs - update BMP today as she is on low dose hydrochlorothiazide  which could contribute to this.

## 2024-08-21 NOTE — Assessment & Plan Note (Signed)
 Levels were too high on daily replacement - now off for several months. Repeat b12 levels.

## 2024-08-21 NOTE — Progress Notes (Signed)
 Ph: [phone] Fax: [phone]   Patient ID: Karen Osborne, female    DOB: [birth year], 68 y.o.   MRN: [id number]  This visit was conducted in person.  BP 130/80   Pulse 73   Temp 97.7 F (36.5 C) (Oral)   Ht 5' 4.25 (1.632 m)   Wt 173 lb 9.6 oz (78.7 kg)   SpO2 98%   BMI 29.57 kg/m    CC: 6 mo f/u visit DM Subjective:   HPI: Karen Osborne is a 68 y.o. female presenting on 08/21/2024 for Medical Management of Chronic Issues (6 month follow up, /Pt has acute concern of not being able to fully void when urinating and is worried of possible infection//States she will call and get colonscopy scheduled)   Known CAD s/p NSTEMI 2013 with DES to LAD. Myoview  2019 low risk. Continues plavix , carvedilol , rosuvastatin .   DM - does regularly check sugars fasting 100-120s. Compliant with antihyperglycemic regimen which includes: metformin  XR 1000mg  daily, Trulicity  1.5mg  weekly on Monday. Tolerating well without nausea, diarrhea/ constipation or epigastric pain. Denies low sugars or hypoglycemic symptoms. Denies paresthesias, blurry vision. Last diabetic eye exam 10/2023. Glucometer brand: accuchek. Last foot exam: 05/2023 - DUE. DSME: started 2019, unable to complete due to cost. Lab Results  Component Value Date   HGBA1C 6.1 (A) 08/21/2024   Diabetic Foot Exam - Simple   Simple Foot Form Diabetic Foot exam was performed with the following findings: Yes 08/21/2024  8:31 AM  Visual Inspection No deformities, no ulcerations, no other skin breakdown bilaterally: Yes Sensation Testing Intact to touch and monofilament testing bilaterally: Yes Pulse Check Posterior Tibialis and Dorsalis pulse intact bilaterally: Yes Comments No claudication Callus to bilateral medial great toes    Lab Results  Component Value Date   MICROALBUR <0.7 02/11/2024     Over the past 1+ week noticing incomplete emptying. No dysuria, urgency, frequency, hematuria. No fevers/chills, nausea, flank or  suprapubic discomfort. She has had this previously - was UTI at that time.   Continued smoker 3/4 ppd - contemplative.      Relevant past medical, surgical, family and social history reviewed and updated as indicated. Interim medical history since our last visit reviewed. Allergies and medications reviewed and updated. Outpatient Medications Prior to Visit  Medication Sig Dispense Refill   Accu-Chek Softclix Lancets lancets Use as instructed to check blood sugar once a day 100 each 3   albuterol  (VENTOLIN  HFA) 108 (90 Base) MCG/ACT inhaler Inhale 1-2 puffs into the lungs every 4 (four) hours as needed for wheezing or shortness of breath (or cough.). 8 g 2   alendronate  (FOSAMAX ) 70 MG tablet Take 1 tablet (70 mg total) by mouth every 7 (seven) days. Take with a full glass of water on an empty stomach. 12 tablet 3   aspirin  EC 81 MG tablet Take 1 tablet (81 mg total) by mouth daily. Swallow whole.     Blood Glucose Monitoring Suppl (ACCU-CHEK GUIDE) w/Device KIT Use as instructed to check blood sugar once a day 1 kit 0   carvedilol  (COREG ) 6.25 MG tablet TAKE 1 TABLET BY MOUTH TWICE A DAY WITH FOOD 180 tablet 3   Cholecalciferol (VITAMIN D ) 125 MCG (5000 UT) CAPS Take 1 capsule by mouth every Monday, Wednesday, and Friday.     clopidogrel  (PLAVIX ) 75 MG tablet Take 1 tablet (75 mg total) by mouth daily. 90 tablet 3   Coenzyme Q10 100 MG capsule Take 1 capsule (100 mg total)  by mouth 2 (two) times daily.     Dulaglutide  (TRULICITY ) 1.5 MG/0.5ML SOAJ Inject 1.5 mg into the skin once a week. 2 mL 11   escitalopram  (LEXAPRO ) 10 MG tablet Take 1 tablet (10 mg total) by mouth daily. 90 tablet 3   Fluticasone -Umeclidin-Vilant (TRELEGY ELLIPTA ) 100-62.5-25 MCG/ACT AEPB INHALE 1 PUFF BY MOUTH EVERY DAY 60 each 11   glucose blood (ACCU-CHEK GUIDE TEST) test strip Use as instructed to check blood sugar once a day 100 each 3   hydrochlorothiazide  (MICROZIDE ) 12.5 MG capsule Take 1 capsule (12.5 mg total) by  mouth daily. 30 capsule 0   losartan  (COZAAR ) 50 MG tablet Take 1 tablet (50 mg total) by mouth daily. 90 tablet 3   metFORMIN  (GLUCOPHAGE -XR) 500 MG 24 hr tablet Take 2 tablets (1,000 mg total) by mouth daily. 180 tablet 3   nitroGLYCERIN  (NITROSTAT ) 0.4 MG SL tablet Place 1 tablet (0.4 mg total) under the tongue every 5 (five) minutes as needed for chest pain (up to 3 doses). 25 tablet 3   rOPINIRole  (REQUIP ) 1 MG tablet Take 1-2 tablets (1-2 mg total) by mouth at bedtime. 100 tablet 3   rosuvastatin  (CRESTOR ) 40 MG tablet TAKE 1 TABLET BY MOUTH EVERY DAY 30 tablet 0   nicotine  (NICODERM CQ  - DOSED IN MG/24 HOURS) 14 mg/24hr patch Place 1 patch (14 mg total) onto the skin daily. (Patient not taking: Reported on 08/21/2024) 28 patch 0   Varenicline  Tartrate, Starter, (CHANTIX  STARTING MONTH PAK) 0.5 MG X 11 & 1 MG X 42 TBPK Take one 0.5 mg tablet by mouth once daily for 3 days, then increase to one 0.5 mg tablet twice daily for 4 days, then increase to one 1 mg tablet twice daily. (Patient not taking: Reported on 08/21/2024) 60 each 0   No facility-administered medications prior to visit.     Per HPI unless specifically indicated in ROS section below Review of Systems  Objective:  BP 130/80   Pulse 73   Temp 97.7 F (36.5 C) (Oral)   Ht 5' 4.25 (1.632 m)   Wt 173 lb 9.6 oz (78.7 kg)   SpO2 98%   BMI 29.57 kg/m   Wt Readings from Last 3 Encounters:  08/21/24 173 lb 9.6 oz (78.7 kg)  07/05/24 174 lb 9.6 oz (79.2 kg)  05/10/24 179 lb 6 oz (81.4 kg)      Physical Exam Vitals and nursing note reviewed.  Constitutional:      Appearance: Normal appearance. She is not ill-appearing.  HENT:     Mouth/Throat:     Mouth: Mucous membranes are moist.     Pharynx: Oropharynx is clear. No oropharyngeal exudate or posterior oropharyngeal erythema.  Eyes:     Extraocular Movements: Extraocular movements intact.     Conjunctiva/sclera: Conjunctivae normal.     Pupils: Pupils are equal, round,  and reactive to light.  Neck:     Thyroid : No thyroid  mass or thyromegaly.  Cardiovascular:     Rate and Rhythm: Normal rate and regular rhythm.     Pulses: Normal pulses.     Heart sounds: Normal heart sounds. No murmur heard. Pulmonary:     Effort: Pulmonary effort is normal. No respiratory distress.     Breath sounds: Normal breath sounds. No wheezing, rhonchi or rales.  Abdominal:     General: Bowel sounds are normal. There is no distension.     Palpations: Abdomen is soft. There is no mass.     Tenderness:  There is no abdominal tenderness. There is no right CVA tenderness, left CVA tenderness, guarding or rebound.     Hernia: No hernia is present.  Musculoskeletal:     Cervical back: Normal range of motion and neck supple.     Right lower leg: No edema.     Left lower leg: No edema.  Skin:    General: Skin is warm and dry.     Findings: No rash.  Neurological:     Mental Status: She is alert.  Psychiatric:        Mood and Affect: Mood normal.        Behavior: Behavior normal.       Results for orders placed or performed in visit on 08/21/24  HgB A1c   Collection Time: 08/21/24  8:12 AM  Result Value Ref Range   Hemoglobin A1C 6.1 (A) 4.0 - 5.6 %   HbA1c POC (<> result, manual entry)     HbA1c, POC (prediabetic range)     HbA1c, POC (controlled diabetic range)    POCT Urinalysis Dipstick (Automated)   Collection Time: 08/21/24  8:20 AM  Result Value Ref Range   Color, UA pale yellow    Clarity, UA clear    Glucose, UA Negative Negative   Bilirubin, UA Negative    Ketones, UA Negative    Spec Grav, UA <=1.005 (A) 1.010 - 1.025   Blood, UA Negative    pH, UA 6.5 5.0 - 8.0   Protein, UA Negative Negative   Urobilinogen, UA negative (A) 0.2 or 1.0 E.U./dL   Nitrite, UA Negative    Leukocytes, UA Moderate (2+) (A) Negative   Lab Results  Component Value Date   NA 129 (L) 02/11/2024   CL 91 (L) 02/11/2024   K 4.6 02/11/2024   CO2 29 02/11/2024   BUN 11  02/11/2024   CREATININE 0.87 02/11/2024   GFR 69.04 02/11/2024   CALCIUM  9.5 02/11/2024   ALBUMIN 4.1 02/11/2024   GLUCOSE 131 (H) 02/11/2024    Assessment & Plan:   Problem List Items Addressed This Visit     Essential hypertension   Chronic, great control on current regimen - continue.  Update Na in hydrochlorothiazide  use.       Type 2 diabetes mellitus with other specified complication (HCC) - Primary   Chronic, great control on current regimen - continue.  Foot exam today.       Relevant Orders   HgB A1c (Completed)   Vitamin B12 deficiency   Levels were too high on daily replacement - now off for several months. Repeat b12 levels.       Relevant Orders   Vitamin B12   Incomplete emptying of bladder   UA suspicious for infection. UCx sent Treat with 3d bactrim  DS BID.       Relevant Orders   POCT Urinalysis Dipstick (Automated) (Completed)   Urine Culture   Hyponatremia   Noted low reading on last labs - update BMP today as she is on low dose hydrochlorothiazide  which could contribute to this.       Relevant Orders   Basic metabolic panel with GFR     Meds ordered this encounter  Medications   sulfamethoxazole -trimethoprim  (BACTRIM  DS) 800-160 MG tablet    Sig: Take 1 tablet by mouth 2 (two) times daily.    Dispense:  6 tablet    Refill:  0    Orders Placed This Encounter  Procedures   Urine Culture  Basic metabolic panel with GFR   Vitamin B12   HgB A1c   POCT Urinalysis Dipstick (Automated)    Patient Instructions  Urinalysis today  You may call [facility] at [phone] to schedule an appointment. If too long to schedule, let me know for referral to [facility] in [place].  Sugar is doing great- continue current medicines  For UTI - take bactrim  antibiotic twice daily for 3 days sent to pharmacy. Let us  know if not fully better after treatment  Follow up plan: Return in about 6 months (around 02/19/2025) for annual exam, prior  fasting for blood work.  Anton Blas, MD

## 2024-08-21 NOTE — Patient Instructions (Addendum)
 Urinalysis today  You may call [facility] at [phone] to schedule an appointment. If too long to schedule, let me know for referral to [facility] in [place].  Sugar is doing great- continue current medicines  For UTI - take bactrim  antibiotic twice daily for 3 days sent to pharmacy. Let us  know if not fully better after treatment

## 2024-08-22 ENCOUNTER — Telehealth: Payer: Self-pay

## 2024-08-22 NOTE — Telephone Encounter (Signed)
 Per pt she is ready to schedule procedure. Per pt will call PCP to get referral if needed.

## 2024-08-23 ENCOUNTER — Telehealth: Payer: Self-pay

## 2024-08-23 ENCOUNTER — Telehealth: Payer: Self-pay | Admitting: Cardiovascular Disease

## 2024-08-23 ENCOUNTER — Other Ambulatory Visit: Payer: Self-pay

## 2024-08-23 DIAGNOSIS — Z860100 Personal history of colon polyps, unspecified: Secondary | ICD-10-CM

## 2024-08-23 NOTE — Telephone Encounter (Signed)
° °  Patient Name: Karen Osborne  DOB: [birth year] MRN: [id number]  Primary Cardiologist: Ozell Fell, MD  Chart reviewed as part of pre-operative protocol coverage. We were requested to weight in on holding plavix . We were not asked to provide medical clearance.   OK to hold plavix  for 3 days prior to colonoscopy. Recommend resuming as soon as possible after procedure. Regarding ASA therapy, we recommend continuation of ASA throughout the perioperative period.  However, if the surgeon feels that cessation of ASA is required in the perioperative period, it may be stopped 5-7 days prior to surgery with a plan to resume it as soon as felt to be feasible from a surgical standpoint in the post-operative period.    Rollo FABIENE Louder, PA-C 08/23/2024, 11:14 AM

## 2024-08-23 NOTE — Telephone Encounter (Signed)
° °  Pre-operative Risk Assessment    Patient Name: Karen Osborne  DOB: July 17, 1956 MRN: [id number]   Date of last office visit: 04/29/23 Date of next office visit: 09/20/24   Request for Surgical Clearance    Procedure:  Colonoscopy  Date of Surgery:  Clearance 11/30/24                                Surgeon:  Dr. Rogelia Copping Surgeon's Group or Practice Name:  [facility] Phone number:  [phone] Fax number:  [phone]   Type of Clearance Requested:   - Pharmacy:  Hold Clopidogrel  (Plavix )     Type of Anesthesia:  General    Additional requests/questions:    Signed, Arsenio Celine GAILS   08/23/2024, 10:56 AM

## 2024-08-23 NOTE — Telephone Encounter (Signed)
 Gastroenterology Pre-Procedure Review  Request Date: 11/30/24 Requesting Physician: Dr. Jinny  PATIENT REVIEW QUESTIONS: The patient responded to the following health history questions as indicated:    1. Are you having any GI issues? no 2. Do you have a personal history of Polyps? yes (last colonoscopy performed by Dr. Janalyn 08/19/18 recommended repeat in 3 years) 3. Do you have a family history of Colon Cancer or Polyps? yes (father colon polyps) 4. Diabetes Mellitus? yes (has been advised and noted on instructions to stop Trulicity  7 days prior, stop Meformin 2 days prior) 5. Joint replacements in the past 12 months?no 6. Major health problems in the past 3 months?Pt has cardiac and pulmonary history clearances have been requested 7. Any artificial heart valves, MVP, or defibrillator?no    MEDICATIONS & ALLERGIES:    Patient reports the following regarding taking any anticoagulation/antiplatelet therapy:   Plavix , Coumadin, Eliquis, Xarelto, Lovenox, Pradaxa, Brilinta , or Effient ? yes (Med advice for Plavix  has been requested from Cardiologist) Aspirin ? yes (81 mg)  Patient confirms/reports the following medications:  Current Outpatient Medications  Medication Sig Dispense Refill   Accu-Chek Softclix Lancets lancets Use as instructed to check blood sugar once a day 100 each 3   albuterol  (VENTOLIN  HFA) 108 (90 Base) MCG/ACT inhaler Inhale 1-2 puffs into the lungs every 4 (four) hours as needed for wheezing or shortness of breath (or cough.). 8 g 2   alendronate  (FOSAMAX ) 70 MG tablet Take 1 tablet (70 mg total) by mouth every 7 (seven) days. Take with a full glass of water on an empty stomach. 12 tablet 3   aspirin  EC 81 MG tablet Take 1 tablet (81 mg total) by mouth daily. Swallow whole.     Blood Glucose Monitoring Suppl (ACCU-CHEK GUIDE) w/Device KIT Use as instructed to check blood sugar once a day 1 kit 0   carvedilol  (COREG ) 6.25 MG tablet TAKE 1 TABLET BY MOUTH TWICE A DAY  WITH FOOD 180 tablet 3   Cholecalciferol (VITAMIN D ) 125 MCG (5000 UT) CAPS Take 1 capsule by mouth every Monday, Wednesday, and Friday.     clopidogrel  (PLAVIX ) 75 MG tablet Take 1 tablet (75 mg total) by mouth daily. 90 tablet 3   Coenzyme Q10 100 MG capsule Take 1 capsule (100 mg total) by mouth 2 (two) times daily.     Dulaglutide  (TRULICITY ) 1.5 MG/0.5ML SOAJ Inject 1.5 mg into the skin once a week. 2 mL 11   escitalopram  (LEXAPRO ) 10 MG tablet Take 1 tablet (10 mg total) by mouth daily. 90 tablet 3   Fluticasone -Umeclidin-Vilant (TRELEGY ELLIPTA ) 100-62.5-25 MCG/ACT AEPB INHALE 1 PUFF BY MOUTH EVERY DAY 60 each 11   glucose blood (ACCU-CHEK GUIDE TEST) test strip Use as instructed to check blood sugar once a day 100 each 3   hydrochlorothiazide  (MICROZIDE ) 12.5 MG capsule Take 1 capsule (12.5 mg total) by mouth daily. 30 capsule 0   losartan  (COZAAR ) 50 MG tablet Take 1 tablet (50 mg total) by mouth daily. 90 tablet 3   metFORMIN  (GLUCOPHAGE -XR) 500 MG 24 hr tablet Take 2 tablets (1,000 mg total) by mouth daily. 180 tablet 3   nicotine  (NICODERM CQ  - DOSED IN MG/24 HOURS) 14 mg/24hr patch Place 1 patch (14 mg total) onto the skin daily. (Patient not taking: Reported on 08/21/2024) 28 patch 0   nitroGLYCERIN  (NITROSTAT ) 0.4 MG SL tablet Place 1 tablet (0.4 mg total) under the tongue every 5 (five) minutes as needed for chest pain (up to 3 doses). 25  tablet 3   rOPINIRole  (REQUIP ) 1 MG tablet Take 1-2 tablets (1-2 mg total) by mouth at bedtime. 100 tablet 3   rosuvastatin  (CRESTOR ) 40 MG tablet TAKE 1 TABLET BY MOUTH EVERY DAY 30 tablet 0   sulfamethoxazole -trimethoprim  (BACTRIM  DS) 800-160 MG tablet Take 1 tablet by mouth 2 (two) times daily. 6 tablet 0   Varenicline  Tartrate, Starter, (CHANTIX  STARTING MONTH PAK) 0.5 MG X 11 & 1 MG X 42 TBPK Take one 0.5 mg tablet by mouth once daily for 3 days, then increase to one 0.5 mg tablet twice daily for 4 days, then increase to one 1 mg tablet twice  daily. (Patient not taking: Reported on 08/21/2024) 60 each 0   No current facility-administered medications for this visit.    Patient confirms/reports the following allergies:  Allergies  Allergen Reactions   Penicillins Rash    Orders Placed This Encounter  Procedures   Ambulatory referral to Gastroenterology    Referral Priority:   Routine    Referral Type:   Consultation    Referral Reason:   Specialty Services Required    Referred to Provider:   Jinny Carmine, MD    Number of Visits Requested:   1    AUTHORIZATION INFORMATION Primary Insurance: 1D#: Group #:  Secondary Insurance: 1D#: Group #:  SCHEDULE INFORMATION: Date: 11/30/24 Time: Location: [facility]

## 2024-08-25 LAB — URINE CULTURE
MICRO NUMBER:: 17326151
SPECIMEN QUALITY:: ADEQUATE

## 2024-08-26 ENCOUNTER — Ambulatory Visit: Payer: Self-pay | Admitting: Family Medicine

## 2024-08-26 MED ORDER — VITAMIN B-12 1000 MCG PO TABS: 1000.0000 ug | ORAL_TABLET | ORAL | Status: AC

## 2024-08-27 ENCOUNTER — Encounter: Payer: Self-pay | Admitting: Family Medicine

## 2024-08-28 LAB — HM MAMMOGRAPHY

## 2024-08-28 NOTE — Telephone Encounter (Signed)
 Voice message has been left for patient to hold Plavix  3 days prior to her colonoscopy per [facility] Preop Note dated 08/23/25.      Patient Name: Karen Osborne  DOB: 12-Jun-1956 MRN: [id number]   Primary Cardiologist: Ozell Fell, MD   Chart reviewed as part of pre-operative protocol coverage. We were requested to weight in on holding plavix . We were not asked to provide medical clearance.    OK to hold plavix  for 3 days prior to colonoscopy. Recommend resuming as soon as possible after procedure. Regarding ASA therapy, we recommend continuation of ASA throughout the perioperative period.  However, if the surgeon feels that cessation of ASA is required in the perioperative period, it may be stopped 5-7 days prior to surgery with a plan to resume it as soon as felt to be feasible from a surgical standpoint in the post-operative period.       Rollo FABIENE Louder, PA-C 08/23/2024, 11:14 AM

## 2024-08-30 ENCOUNTER — Encounter: Payer: Self-pay | Admitting: Family Medicine

## 2024-08-30 NOTE — Telephone Encounter (Signed)
 Order faxed as requested

## 2024-08-30 NOTE — Telephone Encounter (Signed)
 Received written order for Dx mammo and US  r breast for abnormality on screening mammo at [facility] imaging. Will sign and fax back order.

## 2024-09-01 ENCOUNTER — Ambulatory Visit: Payer: Self-pay | Admitting: Family Medicine

## 2024-09-05 DIAGNOSIS — I10 Essential (primary) hypertension: Secondary | ICD-10-CM

## 2024-09-05 DIAGNOSIS — I2510 Atherosclerotic heart disease of native coronary artery without angina pectoris: Secondary | ICD-10-CM

## 2024-09-09 ENCOUNTER — Other Ambulatory Visit: Payer: Self-pay | Admitting: Cardiovascular Disease

## 2024-09-09 DIAGNOSIS — E7800 Pure hypercholesterolemia, unspecified: Secondary | ICD-10-CM

## 2024-09-18 NOTE — Progress Notes (Signed)
 Karen Osborne                                          MRN: [id number]   09/18/2024   The [facility]BC[facility] Quality Team Specialist reviewed this patient medical record for the purposes of chart review for care gap closure. The following were reviewed: abstraction for care gap closure-glycemic status assessment.    VBCI Quality Team

## 2024-09-20 ENCOUNTER — Encounter: Payer: Self-pay | Admitting: Cardiovascular Disease

## 2024-09-20 ENCOUNTER — Ambulatory Visit: Attending: Cardiovascular Disease | Admitting: Cardiovascular Disease

## 2024-09-20 VITALS — BP 128/60 | HR 75 | Ht 66.0 in | Wt 171.4 lb

## 2024-09-20 DIAGNOSIS — I739 Peripheral vascular disease, unspecified: Secondary | ICD-10-CM | POA: Diagnosis not present

## 2024-09-20 DIAGNOSIS — I2510 Atherosclerotic heart disease of native coronary artery without angina pectoris: Secondary | ICD-10-CM | POA: Diagnosis not present

## 2024-09-20 DIAGNOSIS — I6529 Occlusion and stenosis of unspecified carotid artery: Secondary | ICD-10-CM | POA: Diagnosis not present

## 2024-09-20 DIAGNOSIS — I10 Essential (primary) hypertension: Secondary | ICD-10-CM

## 2024-09-20 DIAGNOSIS — E7800 Pure hypercholesterolemia, unspecified: Secondary | ICD-10-CM

## 2024-09-20 DIAGNOSIS — Z720 Tobacco use: Secondary | ICD-10-CM | POA: Diagnosis not present

## 2024-09-20 NOTE — Patient Instructions (Signed)
 Medication Instructions:  Your physician recommends that you continue on your current medications as directed. Please refer to the Current Medication list given to you today.   *If you need a refill on your cardiac medications before your next appointment, please call your pharmacy*  Lab Work: No labs ordered today  If you have labs (blood work) drawn today and your tests are completely normal, you will receive your results only by: MyChart Message (if you have MyChart) OR A paper copy in the mail If you have any lab test that is abnormal or we need to change your treatment, we will call you to review the results.  Testing/Procedures: Your physician has requested that you have an ankle brachial index (ABI). During this test an ultrasound and blood pressure cuff are used to evaluate the arteries that supply the arms and legs with blood.  Allow thirty minutes for this exam.  There are no restrictions or special instructions.  This will take place at [address] [facility] [address]) [address], [place] [address]  Please note: We ask at that you not bring children with you during ultrasound (echo/ vascular) testing. Due to room size and safety concerns, children are not allowed in the ultrasound rooms during exams. Our front office staff cannot provide observation of children in our lobby area while testing is being conducted. An adult accompanying a patient to their appointment will only be allowed in the ultrasound room at the discretion of the ultrasound technician under special circumstances. We apologize for any inconvenience.     Follow-Up: At [facility], you and your health needs are our priority.  As part of our continuing mission to provide you with exceptional heart care, our providers are all part of one team.  This team includes your primary Cardiologist (physician) and Advanced Practice Providers or APPs (Physician Assistants and Nurse Practitioners) who all work  together to provide you with the care you need, when you need it.  Your next appointment:   4 month(s)  Provider:   You may see Deatrice Cage, MD or one of the following Advanced Practice Providers on your designated Care Team:   Lonni Meager, NP Lesley Maffucci, PA-C Bernardino Bring, PA-C Cadence Clinton, PA-C Tylene Lunch, NP Barnie Hila, NP    We recommend signing up for the patient portal called MyChart.  Sign up information is provided on this After Visit Summary.  MyChart is used to connect with patients for Virtual Visits (Telemedicine).  Patients are able to view lab/test results, encounter notes, upcoming appointments, etc.  Non-urgent messages can be sent to your provider as well.   To learn more about what you can do with MyChart, go to [email or web address].

## 2024-09-20 NOTE — Progress Notes (Signed)
 "    Cardiology Office Note   Date:  09/20/2024   ID:  Karen Osborne, DOB 1956-05-17, MRN [id number]  PCP:  Rilla Baller, MD  Cardiologist:  Dr. Darron  Chief Complaint  Patient presents with   Follow-up    12 Month f/u no complaints today. Meds reviewed verbally with pt.      History of Present Illness: Karen Osborne is a 69 y.o. female who is here today for a follow-up visit regarding peripheral arterial disease.   She has known history of coronary artery disease with previous non-STEMI status post PCI and drug-eluting stent placement to the mid LAD in May of 2013, type 2 diabetes, essential hypertension, hyperlipidemia, COPD and tobacco use. She reports prolonged symptoms of bilateral hip and leg claudication that happens after walking about 30 steps.   Noninvasive studies in 2024 showed mildly reduced ABI bilaterally with no evidence of infrainguinal disease.  She was noted to have significant stenosis in bilateral common iliac arteries.    Unfortunately, her husband died of 07-May-2025 from complication of brain tumor and seizures.  She has been under significant stress and reports inability to quit smoking.  She denies chest pain or shortness of breath but reports worsening bilateral hip and lower extremity claudication which is now happening with minimal exertion.   Past Medical History:  Diagnosis Date   Allergy    Anxiety    BRCA negative 08/2015   MyRisk neg   Chronic diastolic CHF (congestive heart failure) (HCC)    COPD (chronic obstructive pulmonary disease) with emphysema (HCC) 2019   Coronary artery disease    a. s/p NSTEMI - LHC 01/27/12: prox LAD 20%, mid LAD 99%, prox OM 30%, prox RCA 30%, mid RCA 50%, dist RCA 30%, EF 60%.  PCI:  2.25 x 16 mm Promus Element DES to mid LAD //  b. Myoview  (7/15):  Fixed apical defect (breast attenuation versus scar), no ischemia, EF 64%; low risk  // Myoview  7/22: EF 69, no ischemia or infarction; low risk   COVID-19 virus infection  07/2021   Rx molnupiravir  -> rash so stopped early   Depression    Diabetes mellitus without complication (HCC) 2019   Emphysema of lung (HCC)    Family history of ovarian cancer    Glucose intolerance (impaired glucose tolerance)    A1C 6.1 01/2012   History of echocardiogram    Echo (01/28/12):  Mild LVH, EF 55%, apical septum and true apex severe HK, Gr 2 DD, mild LAE, normal RVF // Echo 7/18: mild LVH, EF 60-65, no RWMA, Gr 2 DD, normal RVF   History of nuclear stress test    Nuclear stress test 4/19: EF 74, apicoseptal defect c/w atten artifact, normal perfusion; Low Risk   Hypercholesteremia    Hypertension    Myocardial infarction (HCC)    NSVD (normal spontaneous vaginal delivery) 1988   Osteopenia 2016   hip   Osteoporosis 2019   in spine and femoral neck; at [facility]   Restless leg syndrome    Sleep apnea    Stroke [facility])    Tobacco abuse     Past Surgical History:  Procedure Laterality Date   ABDOMINAL HYSTERECTOMY     TAHBSO   COLONOSCOPY  10/2006   TA, [place] (Dr Viktoria)   COLONOSCOPY WITH PROPOFOL  N/A 08/19/2018   TAs, rpt 3 yrs (Tahiliani, Keene NOVAK, MD)   CORONARY ANGIOPLASTY WITH STENT PLACEMENT  01/27/2012   DES to LAD  gyn surgery  02/1998   total hysterectomy, abn pap smear, no cancer   LEFT HEART CATHETERIZATION WITH CORONARY ANGIOGRAM N/A 01/27/2012   Procedure: LEFT HEART CATHETERIZATION WITH CORONARY ANGIOGRAM;  Surgeon: Lonni JONETTA Cash, MD;  Location: [facility] CATH LAB;  Service: Cardiovascular;  Laterality: N/A;   PERCUTANEOUS CORONARY STENT INTERVENTION (PCI-S)  01/27/2012   Procedure: PERCUTANEOUS CORONARY STENT INTERVENTION (PCI-S);  Surgeon: Lonni JONETTA Cash, MD;  Location: [facility] CATH LAB;  Service: Cardiovascular;;     Current Outpatient Medications  Medication Sig Dispense Refill   Accu-Chek Softclix Lancets lancets Use as instructed to check blood sugar once a day 100 each 3   albuterol  (VENTOLIN  HFA) 108 (90 Base) MCG/ACT inhaler Inhale 1-2  puffs into the lungs every 4 (four) hours as needed for wheezing or shortness of breath (or cough.). 8 g 2   alendronate  (FOSAMAX ) 70 MG tablet Take 1 tablet (70 mg total) by mouth every 7 (seven) days. Take with a full glass of water on an empty stomach. 12 tablet 3   aspirin  EC 81 MG tablet Take 1 tablet (81 mg total) by mouth daily. Swallow whole.     Blood Glucose Monitoring Suppl (ACCU-CHEK GUIDE) w/Device KIT Use as instructed to check blood sugar once a day 1 kit 0   carvedilol  (COREG ) 6.25 MG tablet TAKE 1 TABLET BY MOUTH TWICE A DAY WITH FOOD 60 tablet 0   Cholecalciferol (VITAMIN D ) 125 MCG (5000 UT) CAPS Take 1 capsule by mouth every Monday, Wednesday, and Friday.     clopidogrel  (PLAVIX ) 75 MG tablet Take 1 tablet (75 mg total) by mouth daily. 90 tablet 3   Coenzyme Q10 100 MG capsule Take 1 capsule (100 mg total) by mouth 2 (two) times daily.     cyanocobalamin  (VITAMIN B12) 1000 MCG tablet Take 1 tablet (1,000 mcg total) by mouth once a week.     Dulaglutide  (TRULICITY ) 1.5 MG/0.5ML SOAJ Inject 1.5 mg into the skin once a week. 2 mL 11   escitalopram  (LEXAPRO ) 10 MG tablet Take 1 tablet (10 mg total) by mouth daily. 90 tablet 3   Fluticasone -Umeclidin-Vilant (TRELEGY ELLIPTA ) 100-62.5-25 MCG/ACT AEPB INHALE 1 PUFF BY MOUTH EVERY DAY 60 each 11   glucose blood (ACCU-CHEK GUIDE TEST) test strip Use as instructed to check blood sugar once a day 100 each 3   hydrochlorothiazide  (MICROZIDE ) 12.5 MG capsule TAKE 1 CAPSULE BY MOUTH EVERY DAY 90 capsule 0   losartan  (COZAAR ) 50 MG tablet Take 1 tablet (50 mg total) by mouth daily. 90 tablet 3   metFORMIN  (GLUCOPHAGE -XR) 500 MG 24 hr tablet Take 2 tablets (1,000 mg total) by mouth daily. 180 tablet 3   nitroGLYCERIN  (NITROSTAT ) 0.4 MG SL tablet Place 1 tablet (0.4 mg total) under the tongue every 5 (five) minutes as needed for chest pain (up to 3 doses). 25 tablet 3   rOPINIRole  (REQUIP ) 1 MG tablet Take 1-2 tablets (1-2 mg total) by mouth at  bedtime. 100 tablet 3   rosuvastatin  (CRESTOR ) 40 MG tablet TAKE 1 TABLET BY MOUTH EVERY DAY 90 tablet 0   nicotine  (NICODERM CQ  - DOSED IN MG/24 HOURS) 14 mg/24hr patch Place 1 patch (14 mg total) onto the skin daily. (Patient not taking: Reported on 09/20/2024) 28 patch 0   sulfamethoxazole -trimethoprim  (BACTRIM  DS) 800-160 MG tablet Take 1 tablet by mouth 2 (two) times daily. (Patient not taking: Reported on 09/20/2024) 6 tablet 0   Varenicline  Tartrate, Starter, (CHANTIX  STARTING MONTH PAK) 0.5 MG X  11 & 1 MG X 42 TBPK Take one 0.5 mg tablet by mouth once daily for 3 days, then increase to one 0.5 mg tablet twice daily for 4 days, then increase to one 1 mg tablet twice daily. (Patient not taking: Reported on 09/20/2024) 60 each 0   No current facility-administered medications for this visit.    Allergies:   Penicillins    Social History:  The patient  reports that she has been smoking cigarettes. She started smoking about 3 years ago. She has a 98.3 pack-year smoking history. She has never used smokeless tobacco. She reports that she does not drink alcohol and does not use drugs.   Family History:  The patient's family history includes Arthritis in her mother; Diabetes in her sister; Heart attack in her father; Heart disease (age of onset: 30) in her father; Hypertension in her father; Kidney cancer in her father; Ovarian cancer (age of onset: 16) in her maternal aunt; Prostate cancer in her father.    ROS:  Please see the history of present illness.   Otherwise, review of systems are positive for none.   All other systems are reviewed and negative.    PHYSICAL EXAM: VS:  BP 128/60 (BP Location: Left Arm, Patient Position: Sitting, Cuff Size: Normal)   Pulse 75   Ht 5' 6 (1.676 m)   Wt 171 lb 6 oz (77.7 kg)   SpO2 98%   BMI 27.66 kg/m  , BMI Body mass index is 27.66 kg/m. GEN: Well nourished, well developed, in no acute distress  HEENT: normal  Neck: no JVD,  or masses.  Bilateral  carotid bruits Cardiac: RRR; no rubs, or gallops,no edema .  2 / 6 systolic murmur in the aortic area Respiratory:  clear to auscultation bilaterally, normal work of breathing GI: soft, nontender, nondistended, + BS MS: no deformity or atrophy  Skin: warm and dry, no rash Neuro:  Strength and sensation are intact Psych: euthymic mood, full affect Vascular: Femoral pulses +1 bilaterally.  Distal pedal pulses are very faint.   EKG:  EKG is ordered today. EKG showed: Normal sinus rhythm Low voltage QRS Nonspecific ST and T wave abnormality When compared with ECG of 29-Apr-2023 13:21, Minimal criteria for Inferior infarct are no longer Present Nonspecific T wave abnormality now evident in Lateral leads    Recent Labs: 02/11/2024: ALT 11; Hemoglobin 14.7; Platelets 253.0; TSH 2.72 08/21/2024: BUN 8; Creatinine, Ser 0.78; Potassium 4.6; Sodium 139    Lipid Panel    Component Value Date/Time   CHOL 99 02/11/2024 0724   CHOL 123 06/25/2017 0734   TRIG 181.0 (H) 02/11/2024 0724   HDL 28.60 (L) 02/11/2024 0724   HDL 28 (L) 06/25/2017 0734   CHOLHDL 3 02/11/2024 0724   VLDL 36.2 02/11/2024 0724   LDLCALC 34 02/11/2024 0724   LDLCALC 64 06/25/2017 0734      Wt Readings from Last 3 Encounters:  09/20/24 171 lb 6 oz (77.7 kg)  08/21/24 173 lb 9.6 oz (78.7 kg)  07/05/24 174 lb 9.6 oz (79.2 kg)           No data to display            ASSESSMENT AND PLAN:  1.  Peripheral arterial disease: She reports worsening of bilateral hip and leg claudication.  Will obtain an ABI and aortoiliac duplex to compare and consider angiography and revascularization if needed.   2.  Bilateral carotid disease with bilateral bruits: Most recent carotid Doppler  in September showed stable moderate left carotid stenosis.  Recommend repeat study in September 2026.  3.  Coronary artery disease involving native coronary arteries without angina: She is stable overall.  She is on long-term dual  antiplatelet therapy.  4.  Tobacco use: She reports significant difficulty with quitting especially after the death of her husband last summer.  5.  Hyperlipidemia: Currently on rosuvastatin  40 mg once daily.  Most recent lipid profile showed an LDL of 34 which is at target.    Disposition:   FU with me in 4 months  Signed,  Deatrice Cage, MD  09/20/2024 3:48 PM    [facility] "

## 2024-09-20 NOTE — Telephone Encounter (Signed)
 Called and spoke to pt, she had digital mammography done on 12/15 at [facility] in [place], she has diagnostic mammo scheduled for 1/21 in [facility] [place]

## 2024-10-04 ENCOUNTER — Telehealth: Payer: Self-pay

## 2024-10-04 NOTE — Telephone Encounter (Signed)
 Copied from CRM [id number]. Topic: Clinical - Medical Advice >> Oct 04, 2024 12:01 PM Alexandria E wrote: Reason for CRM: Patient was supposed to have her mammogram done at [facility] but they unfortunately do not accept her insurance. Patient questioning if PCP advises any other location that is in network.

## 2024-10-04 NOTE — Telephone Encounter (Signed)
 Patient was advised to get diagnostic mammogram from screening that was done with [facility]. Her insurance has changed and they no longer take her new insurance. Would like to have new order sent to [facility].

## 2024-10-05 ENCOUNTER — Other Ambulatory Visit: Payer: Self-pay | Admitting: Cardiovascular Disease

## 2024-10-05 ENCOUNTER — Encounter: Payer: Self-pay | Admitting: Family Medicine

## 2024-10-05 ENCOUNTER — Ambulatory Visit

## 2024-10-05 VITALS — Ht 66.0 in | Wt 171.0 lb

## 2024-10-05 DIAGNOSIS — I2510 Atherosclerotic heart disease of native coronary artery without angina pectoris: Secondary | ICD-10-CM

## 2024-10-05 DIAGNOSIS — I10 Essential (primary) hypertension: Secondary | ICD-10-CM

## 2024-10-05 DIAGNOSIS — R928 Other abnormal and inconclusive findings on diagnostic imaging of breast: Secondary | ICD-10-CM

## 2024-10-05 NOTE — Telephone Encounter (Addendum)
 I have placed a referral for dx mammo/US  to [facility] for abnormal screening mammogram done at [facility].

## 2024-10-05 NOTE — Progress Notes (Signed)
 "  Chief Complaint  Patient presents with   Medicare Wellness     Subjective:   Karen Osborne is a 69 y.o. female who presents for a Medicare Annual Wellness Visit.  Visit info / Clinical Intake: Medicare Wellness Visit Type:: Initial Annual Wellness Visit Persons participating in visit and providing information:: patient Medicare Wellness Visit Mode:: Telephone If telephone:: video declined Since this visit was completed virtually, some vitals may be partially provided or unavailable. Missing vitals are due to the limitations of the virtual format.: Unable to obtain vitals - no equipment If Telephone or Video please confirm:: I connected with patient using audio/video enable telemedicine. I verified patient identity with two identifiers, discussed telehealth limitations, and patient agreed to proceed. Patient Location:: Home Provider Location:: Office Interpreter Needed?: No Pre-visit prep was completed: yes AWV questionnaire completed by patient prior to visit?: no Living arrangements:: with family/others Patient's Overall Health Status Rating: good Typical amount of pain: none Does pain affect daily life?: no Are you currently prescribed opioids?: no  Dietary Habits and Nutritional Risks How many meals a day?: 3 Eats fruit and vegetables daily?: (!) no Most meals are obtained by: preparing own meals In the last 2 weeks, have you had any of the following?: none Diabetic:: (!) yes How often do you check your BS?: as needed (3-4 times a week) Would you like to be referred to a Nutritionist or for Diabetic Management? : no  Functional Status Activities of Daily Living (to include ambulation/medication): Independent Ambulation: Independent with device- listed below Home Assistive Devices/Equipment: Eyeglasses Medication Administration: Independent Home Management (perform basic housework or laundry): Independent Manage your own finances?: yes Primary transportation is:  driving Concerns about vision?: no *vision screening is required for WTM* Concerns about hearing?: no  Fall Screening Falls in the past year?: 0 Number of falls in past year: 0 Was there an injury with Fall?: 0 Fall Risk Category Calculator: 0 Patient Fall Risk Level: Low Fall Risk  Fall Risk Patient at Risk for Falls Due to: No Fall Risks Fall risk Follow up: Falls evaluation completed; Falls prevention discussed  Home and Transportation Safety: All rugs have non-skid backing?: N/A, no rugs All stairs or steps have railings?: yes (5 steps coming in home) Grab bars in the bathtub or shower?: yes Have non-skid surface in bathtub or shower?: yes Good home lighting?: yes Regular seat belt use?: yes Hospital stays in the last year:: no  Cognitive Assessment Difficulty concentrating, remembering, or making decisions? : no Will 6CIT or Mini Cog be Completed: no 6CIT or Mini Cog Declined: patient alert, oriented, able to answer questions appropriately and recall recent events  Advance Directives (For Healthcare) Does Patient Have a Medical Advance Directive?: No  Reviewed/Updated  Reviewed/Updated: Reviewed All (Medical, Surgical, Family, Medications, Allergies, Care Teams, Patient Goals)    Allergies (verified) Penicillins   Current Medications (verified) Outpatient Encounter Medications as of 10/05/2024  Medication Sig   Accu-Chek Softclix Lancets lancets Use as instructed to check blood sugar once a day   albuterol  (VENTOLIN  HFA) 108 (90 Base) MCG/ACT inhaler Inhale 1-2 puffs into the lungs every 4 (four) hours as needed for wheezing or shortness of breath (or cough.).   alendronate  (FOSAMAX ) 70 MG tablet Take 1 tablet (70 mg total) by mouth every 7 (seven) days. Take with a full glass of water on an empty stomach.   aspirin  EC 81 MG tablet Take 1 tablet (81 mg total) by mouth daily. Swallow whole.   Blood  Glucose Monitoring Suppl (ACCU-CHEK GUIDE) w/Device KIT Use as  instructed to check blood sugar once a day   carvedilol  (COREG ) 6.25 MG tablet TAKE 1 TABLET BY MOUTH TWICE A DAY WITH FOOD   Cholecalciferol (VITAMIN D ) 125 MCG (5000 UT) CAPS Take 1 capsule by mouth every Monday, Wednesday, and Friday.   clopidogrel  (PLAVIX ) 75 MG tablet Take 1 tablet (75 mg total) by mouth daily.   Coenzyme Q10 100 MG capsule Take 1 capsule (100 mg total) by mouth 2 (two) times daily.   cyanocobalamin  (VITAMIN B12) 1000 MCG tablet Take 1 tablet (1,000 mcg total) by mouth once a week.   Dulaglutide  (TRULICITY ) 1.5 MG/0.5ML SOAJ Inject 1.5 mg into the skin once a week.   escitalopram  (LEXAPRO ) 10 MG tablet Take 1 tablet (10 mg total) by mouth daily.   Fluticasone -Umeclidin-Vilant (TRELEGY ELLIPTA ) 100-62.5-25 MCG/ACT AEPB INHALE 1 PUFF BY MOUTH EVERY DAY   glucose blood (ACCU-CHEK GUIDE TEST) test strip Use as instructed to check blood sugar once a day   hydrochlorothiazide  (MICROZIDE ) 12.5 MG capsule TAKE 1 CAPSULE BY MOUTH EVERY DAY   losartan  (COZAAR ) 50 MG tablet Take 1 tablet (50 mg total) by mouth daily.   metFORMIN  (GLUCOPHAGE -XR) 500 MG 24 hr tablet Take 2 tablets (1,000 mg total) by mouth daily.   nicotine  (NICODERM CQ  - DOSED IN MG/24 HOURS) 14 mg/24hr patch Place 1 patch (14 mg total) onto the skin daily.   nitroGLYCERIN  (NITROSTAT ) 0.4 MG SL tablet Place 1 tablet (0.4 mg total) under the tongue every 5 (five) minutes as needed for chest pain (up to 3 doses).   rOPINIRole  (REQUIP ) 1 MG tablet Take 1-2 tablets (1-2 mg total) by mouth at bedtime.   rosuvastatin  (CRESTOR ) 40 MG tablet TAKE 1 TABLET BY MOUTH EVERY DAY   sulfamethoxazole -trimethoprim  (BACTRIM  DS) 800-160 MG tablet Take 1 tablet by mouth 2 (two) times daily.   Varenicline  Tartrate, Starter, (CHANTIX  STARTING MONTH PAK) 0.5 MG X 11 & 1 MG X 42 TBPK Take one 0.5 mg tablet by mouth once daily for 3 days, then increase to one 0.5 mg tablet twice daily for 4 days, then increase to one 1 mg tablet twice daily.    No facility-administered encounter medications on file as of 10/05/2024.    History: Past Medical History:  Diagnosis Date   Allergy    Anxiety    BRCA negative 08/2015   MyRisk neg   Chronic diastolic CHF (congestive heart failure) (HCC)    COPD (chronic obstructive pulmonary disease) with emphysema (HCC) 2019   Coronary artery disease    a. s/p NSTEMI - LHC 01/27/12: prox LAD 20%, mid LAD 99%, prox OM 30%, prox RCA 30%, mid RCA 50%, dist RCA 30%, EF 60%.  PCI:  2.25 x 16 mm Promus Element DES to mid LAD //  b. Myoview  (7/15):  Fixed apical defect (breast attenuation versus scar), no ischemia, EF 64%; low risk  // Myoview  7/22: EF 69, no ischemia or infarction; low risk   COVID-19 virus infection 07/2021   Rx molnupiravir  -> rash so stopped early   Depression    Diabetes mellitus without complication (HCC) 2019   Emphysema of lung (HCC)    Family history of ovarian cancer    Glucose intolerance (impaired glucose tolerance)    A1C 6.1 01/2012   History of echocardiogram    Echo (01/28/12):  Mild LVH, EF 55%, apical septum and true apex severe HK, Gr 2 DD, mild LAE, normal RVF // Echo  7/18: mild LVH, EF 60-65, no RWMA, Gr 2 DD, normal RVF   History of nuclear stress test    Nuclear stress test 4/19: EF 74, apicoseptal defect c/w atten artifact, normal perfusion; Low Risk   Hypercholesteremia    Hypertension    Myocardial infarction (HCC)    NSVD (normal spontaneous vaginal delivery) 1988   Osteopenia 2016   hip   Osteoporosis 2019   in spine and femoral neck; at [facility]   Restless leg syndrome    Sleep apnea    Stroke [facility])    Tobacco abuse    Past Surgical History:  Procedure Laterality Date   ABDOMINAL HYSTERECTOMY     TAHBSO   COLONOSCOPY  10/2006   TA, [facility] (Dr Viktoria)   COLONOSCOPY WITH PROPOFOL  N/A 08/19/2018   TAs, rpt 3 yrs (Tahiliani, [place] B, MD)   CORONARY ANGIOPLASTY WITH STENT PLACEMENT  01/27/2012   DES to LAD   gyn surgery  02/1998   total hysterectomy, abn  pap smear, no cancer   LEFT HEART CATHETERIZATION WITH CORONARY ANGIOGRAM N/A 01/27/2012   Procedure: LEFT HEART CATHETERIZATION WITH CORONARY ANGIOGRAM;  Surgeon: Lonni JONETTA Cash, MD;  Location: [facility] CATH LAB;  Service: Cardiovascular;  Laterality: N/A;   PERCUTANEOUS CORONARY STENT INTERVENTION (PCI-S)  01/27/2012   Procedure: PERCUTANEOUS CORONARY STENT INTERVENTION (PCI-S);  Surgeon: Lonni JONETTA Cash, MD;  Location: [facility] CATH LAB;  Service: Cardiovascular;;   Family History  Problem Relation Age of Onset   Arthritis Mother    Prostate cancer Father    Heart disease Father 30   Hypertension Father    Heart attack Father    Kidney cancer Father    Diabetes Sister    Ovarian cancer Maternal Aunt 25   Breast cancer Neg Hx    Social History   Occupational History   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[employer],on the floor  Tobacco Use   Smoking status: Every Day    Current packs/day: 0.50    Average packs/day: 1.9 packs/day for 51.5 years (98.3 ttl pk-yrs)    Types: Cigarettes    Start date: 2023   Smokeless tobacco: Never   Tobacco comments:    0.75 PPD- khj 07/05/2024  Vaping Use   Vaping status: Never Used  Substance and Sexual Activity   Alcohol use: No   Drug use: No   Sexual activity: Not Currently    Birth control/protection: Surgical    Comment: Hysterectomy   Tobacco Counseling Ready to quit: Not Answered Counseling given: Not Answered Tobacco comments: 0.75 PPD- khj 07/05/2024  SDOH Screenings   Food Insecurity: No Food Insecurity (10/05/2024)  Housing: Unknown (10/05/2024)  Transportation Needs: No Transportation Needs (10/05/2024)  Utilities: Not At Risk (10/05/2024)  Depression (PHQ2-9): Medium Risk (10/05/2024)  Financial Resource Strain: Low Risk (08/17/2024)  Physical Activity: Inactive (10/05/2024)  Social Connections: Socially Isolated (10/05/2024)  Stress: No Stress Concern Present (10/05/2024)  Recent Concern: Stress - Stress  Concern Present (08/17/2024)  Tobacco Use: High Risk (10/05/2024)  Health Literacy: Adequate Health Literacy (10/05/2024)   See flowsheets for full screening details  Depression Screen PHQ 2 & 9 Depression Scale- Over the past 2 weeks, how often have you been bothered by any of the following problems? Little interest or pleasure in doing things: 2 (due to recent loss of husband) Feeling down, depressed, or hopeless (PHQ Adolescent also includes...irritable): 2 (due to recent loss of husband) PHQ-2 Total Score: 4 Trouble falling or staying asleep, or sleeping too  much: 0 Feeling tired or having little energy: 1 Poor appetite or overeating (PHQ Adolescent also includes...weight loss): 0 Feeling bad about yourself - or that you are a failure or have let yourself or your family down: 0 Trouble concentrating on things, such as reading the newspaper or watching television (PHQ Adolescent also includes...like school work): 0 Moving or speaking so slowly that other people could have noticed. Or the opposite - being so fidgety or restless that you have been moving around a lot more than usual: 0 Thoughts that you would be better off dead, or of hurting yourself in some way: 0 PHQ-9 Total Score: 5 If you checked off any problems, how difficult have these problems made it for you to do your work, take care of things at home, or get along with other people?: Not difficult at all  Depression Treatment Depression Interventions/Treatment : Counseling     Goals Addressed               This Visit's Progress     Patient Stated (pt-stated)        None/2026             Objective:    Today's Vitals   10/05/24 1358  Weight: 171 lb (77.6 kg)  Height: 5' 6 (1.676 m)   Body mass index is 27.6 kg/m.  Hearing/Vision screen Hearing Screening - Comments:: Denies hearing difficulties   Vision Screening - Comments:: Wears eyeglasses/Dr. Manus Bell/UTD Immunizations and Health  Maintenance Health Maintenance  Topic Date Due   Colonoscopy  08/19/2021   Bone Density Scan  03/10/2024   COVID-19 Vaccine (4 - 2025-26 season) 05/15/2024   OPHTHALMOLOGY EXAM  10/17/2024   Diabetic kidney evaluation - Urine ACR  02/10/2025   HEMOGLOBIN A1C  02/19/2025   Lung Cancer Screening  08/02/2025   Diabetic kidney evaluation - eGFR measurement  08/21/2025   FOOT EXAM  08/21/2025   Mammogram  08/28/2025   Medicare Annual Wellness (AWV)  10/05/2025   DTaP/Tdap/Td (3 - Td or Tdap) 01/25/2031   Pneumococcal Vaccine: 50+ Years  Completed   Influenza Vaccine  Completed   Hepatitis C Screening  Completed   Zoster Vaccines- Shingrix  Completed   Meningococcal B Vaccine  Aged Out        Assessment/Plan:  This is a routine wellness examination for [facility].  Patient Care Team: Rilla Baller, MD as PCP - General (Family Medicine) Wonda Sharper, MD as PCP - Cardiology (Cardiology) Lelon Glendia ONEIDA DEVONNA as Physician Assistant (Cardiology)  I have personally reviewed and noted the following in the patients chart:   Medical and social history Use of alcohol, tobacco or illicit drugs  Current medications and supplements including opioid prescriptions. Functional ability and status Nutritional status Physical activity Advanced directives List of other physicians Hospitalizations, surgeries, and ER visits in previous 12 months Vitals Screenings to include cognitive, depression, and falls Referrals and appointments  No orders of the defined types were placed in this encounter.  In addition, I have reviewed and discussed with patient certain preventive protocols, quality metrics, and best practice recommendations. A written personalized care plan for preventive services as well as general preventive health recommendations were provided to patient.   Shubham Thackston L Cydnee Fuquay, CMA   10/05/2024   Return in 1 year (on 10/05/2025).  After Visit Summary: (MyChart) Due to this being a  telephonic visit, the after visit summary with patients personalized plan was offered to patient via MyChart   Nurse Notes: Patient  stated that she is scheduled for  colonoscopy in March.  She is due for a DEXA and would like to discuss during her next office visit.  Patient had no concerns to address today. "

## 2024-10-05 NOTE — Patient Instructions (Addendum)
 Karen Osborne,  Thank you for taking the time for your Medicare Wellness Visit. I appreciate your continued commitment to your health goals. Please review the care plan we discussed, and feel free to reach out if I can assist you further.  Please note that Annual Wellness Visits do not include a physical exam. Some assessments may be limited, especially if the visit was conducted virtually. If needed, we may recommend an in-person follow-up with your provider.  Ongoing Care Seeing your primary care provider every 3 to 6 months helps us  monitor your health and provide consistent, personalized care. Last office visit on 08/21/2024.  You are due for a bone density screening.  Please discuss with provider during your next office visit.  Aim for 30 minutes of exercise or brisk walking, 6-8 glasses of water, and 5 servings of fruits and vegetables each day.   Referrals If a referral was made during today's visit and you haven't received any updates within two weeks, please contact the referred provider directly to check on the status.  Recommended Screenings:  Health Maintenance  Topic Date Due   Colon Cancer Screening  08/19/2021   Medicare Annual Wellness Visit  02/16/2024   Osteoporosis screening with Bone Density Scan  03/10/2024   COVID-19 Vaccine (4 - 2025-26 season) 05/15/2024   Eye exam for diabetics  10/17/2024   Kidney health urinalysis for diabetes  02/10/2025   Hemoglobin A1C  02/19/2025   Screening for Lung Cancer  08/02/2025   Yearly kidney function blood test for diabetes  08/21/2025   Complete foot exam   08/21/2025   Breast Cancer Screening  08/28/2025   DTaP/Tdap/Td vaccine (3 - Td or Tdap) 01/25/2031   Pneumococcal Vaccine for age over 21  Completed   Flu Shot  Completed   Hepatitis C Screening  Completed   Zoster (Shingles) Vaccine  Completed   Meningitis B Vaccine  Aged Out       10/05/2024    2:02 PM  Advanced Directives  Does Patient Have a Medical Advance  Directive? No    Vision: Annual vision screenings are recommended for early detection of glaucoma, cataracts, and diabetic retinopathy. These exams can also reveal signs of chronic conditions such as diabetes and high blood pressure.  Dental: Annual dental screenings help detect early signs of oral cancer, gum disease, and other conditions linked to overall health, including heart disease and diabetes.  Please see the attached documents for additional preventive care recommendations.

## 2024-10-06 NOTE — Telephone Encounter (Signed)
 Called patient let her know that order was placed if she does not get call to set up let us  know next week.

## 2024-10-10 ENCOUNTER — Other Ambulatory Visit: Payer: Self-pay | Admitting: *Deleted

## 2024-10-10 ENCOUNTER — Inpatient Hospital Stay
Admission: RE | Admit: 2024-10-10 | Discharge: 2024-10-10 | Disposition: A | Payer: Self-pay | Source: Ambulatory Visit | Attending: Family Medicine | Admitting: Family Medicine

## 2024-10-10 DIAGNOSIS — Z1231 Encounter for screening mammogram for malignant neoplasm of breast: Secondary | ICD-10-CM

## 2024-10-11 ENCOUNTER — Other Ambulatory Visit: Payer: Self-pay | Admitting: Cardiovascular Disease

## 2024-10-11 DIAGNOSIS — I6529 Occlusion and stenosis of unspecified carotid artery: Secondary | ICD-10-CM

## 2024-10-11 DIAGNOSIS — I10 Essential (primary) hypertension: Secondary | ICD-10-CM

## 2024-10-11 DIAGNOSIS — I739 Peripheral vascular disease, unspecified: Secondary | ICD-10-CM

## 2024-10-11 DIAGNOSIS — Z720 Tobacco use: Secondary | ICD-10-CM

## 2024-10-11 DIAGNOSIS — E7800 Pure hypercholesterolemia, unspecified: Secondary | ICD-10-CM

## 2024-10-11 DIAGNOSIS — I2510 Atherosclerotic heart disease of native coronary artery without angina pectoris: Secondary | ICD-10-CM

## 2024-10-12 ENCOUNTER — Ambulatory Visit: Attending: Cardiovascular Disease

## 2024-10-12 ENCOUNTER — Ambulatory Visit (INDEPENDENT_AMBULATORY_CARE_PROVIDER_SITE_OTHER)

## 2024-10-12 DIAGNOSIS — I739 Peripheral vascular disease, unspecified: Secondary | ICD-10-CM

## 2024-10-12 DIAGNOSIS — I10 Essential (primary) hypertension: Secondary | ICD-10-CM | POA: Diagnosis not present

## 2024-10-12 LAB — VAS US ABI WITH/WO TBI
Left ABI: 1.03
Right ABI: 0.92

## 2024-10-13 ENCOUNTER — Ambulatory Visit
Admission: RE | Admit: 2024-10-13 | Discharge: 2024-10-13 | Disposition: A | Source: Ambulatory Visit | Attending: Family Medicine | Admitting: Family Medicine

## 2024-10-13 ENCOUNTER — Ambulatory Visit: Payer: Self-pay | Admitting: Cardiovascular Disease

## 2024-10-13 DIAGNOSIS — R928 Other abnormal and inconclusive findings on diagnostic imaging of breast: Secondary | ICD-10-CM | POA: Diagnosis present

## 2024-10-14 ENCOUNTER — Ambulatory Visit: Payer: Self-pay | Admitting: Family Medicine

## 2024-11-30 ENCOUNTER — Ambulatory Visit: Admit: 2024-11-30 | Payer: Self-pay | Admitting: Gastroenterology

## 2024-11-30 SURGERY — COLONOSCOPY
Anesthesia: General

## 2025-01-18 ENCOUNTER — Ambulatory Visit: Admitting: Student
